# Patient Record
Sex: Female | Born: 1935 | Race: White | Hispanic: No | Marital: Married | Smoking: Former smoker
Health system: Southern US, Community
[De-identification: ages and names within clinical notes are randomized; demographics above are authoritative.]

## PROBLEM LIST (undated history)

## (undated) DIAGNOSIS — E1129 Type 2 diabetes mellitus with other diabetic kidney complication: Secondary | ICD-10-CM

## (undated) DIAGNOSIS — N184 Chronic kidney disease, stage 4 (severe): Secondary | ICD-10-CM

## (undated) DIAGNOSIS — I5032 Chronic diastolic (congestive) heart failure: Secondary | ICD-10-CM

## (undated) DIAGNOSIS — E669 Obesity, unspecified: Secondary | ICD-10-CM

## (undated) DIAGNOSIS — E039 Hypothyroidism, unspecified: Secondary | ICD-10-CM

## (undated) DIAGNOSIS — R011 Cardiac murmur, unspecified: Secondary | ICD-10-CM

## (undated) DIAGNOSIS — E785 Hyperlipidemia, unspecified: Secondary | ICD-10-CM

## (undated) DIAGNOSIS — D638 Anemia in other chronic diseases classified elsewhere: Secondary | ICD-10-CM

## (undated) DIAGNOSIS — N2581 Secondary hyperparathyroidism of renal origin: Secondary | ICD-10-CM

## (undated) DIAGNOSIS — M109 Gout, unspecified: Secondary | ICD-10-CM

## (undated) DIAGNOSIS — IMO0001 Reserved for inherently not codable concepts without codable children: Secondary | ICD-10-CM

## (undated) DIAGNOSIS — I10 Essential (primary) hypertension: Secondary | ICD-10-CM

## (undated) DIAGNOSIS — M1990 Unspecified osteoarthritis, unspecified site: Secondary | ICD-10-CM

## (undated) DIAGNOSIS — R609 Edema, unspecified: Secondary | ICD-10-CM

## (undated) HISTORY — PX: BACK SURGERY: SHX140

## (undated) HISTORY — PX: HIP SURGERY: SHX245

## (undated) HISTORY — PX: ANKLE SURGERY: SHX546

## (undated) HISTORY — PX: ROTATOR CUFF REPAIR: SHX139

---

## 1999-02-03 ENCOUNTER — Other Ambulatory Visit: Admission: RE | Admit: 1999-02-03 | Discharge: 1999-02-03 | Payer: Self-pay | Admitting: *Deleted

## 2001-08-30 ENCOUNTER — Encounter: Admission: RE | Admit: 2001-08-30 | Discharge: 2001-10-03 | Payer: Self-pay | Admitting: *Deleted

## 2002-03-17 ENCOUNTER — Inpatient Hospital Stay (HOSPITAL_COMMUNITY): Admission: RE | Admit: 2002-03-17 | Discharge: 2002-03-21 | Payer: Self-pay | Admitting: Orthopedic Surgery

## 2002-03-17 ENCOUNTER — Encounter: Payer: Self-pay | Admitting: Orthopedic Surgery

## 2002-03-21 ENCOUNTER — Inpatient Hospital Stay
Admission: AD | Admit: 2002-03-21 | Discharge: 2002-03-28 | Payer: Self-pay | Admitting: Physical Medicine & Rehabilitation

## 2002-07-30 ENCOUNTER — Ambulatory Visit (HOSPITAL_BASED_OUTPATIENT_CLINIC_OR_DEPARTMENT_OTHER): Admission: RE | Admit: 2002-07-30 | Discharge: 2002-07-30 | Payer: Self-pay | Admitting: Orthopedic Surgery

## 2003-02-14 ENCOUNTER — Encounter: Payer: Self-pay | Admitting: Emergency Medicine

## 2003-02-14 ENCOUNTER — Emergency Department (HOSPITAL_COMMUNITY): Admission: EM | Admit: 2003-02-14 | Discharge: 2003-02-14 | Payer: Self-pay | Admitting: Emergency Medicine

## 2004-04-27 ENCOUNTER — Ambulatory Visit (HOSPITAL_COMMUNITY): Admission: RE | Admit: 2004-04-27 | Discharge: 2004-04-27 | Payer: Self-pay | Admitting: Interventional Cardiology

## 2005-07-13 ENCOUNTER — Encounter: Admission: RE | Admit: 2005-07-13 | Discharge: 2005-07-13 | Payer: Self-pay | Admitting: Nephrology

## 2006-08-15 ENCOUNTER — Emergency Department (HOSPITAL_COMMUNITY): Admission: EM | Admit: 2006-08-15 | Discharge: 2006-08-15 | Payer: Self-pay | Admitting: Emergency Medicine

## 2006-10-23 ENCOUNTER — Encounter: Admission: RE | Admit: 2006-10-23 | Discharge: 2006-10-23 | Payer: Self-pay | Admitting: Orthopedic Surgery

## 2006-10-26 ENCOUNTER — Ambulatory Visit (HOSPITAL_BASED_OUTPATIENT_CLINIC_OR_DEPARTMENT_OTHER): Admission: RE | Admit: 2006-10-26 | Discharge: 2006-10-26 | Payer: Self-pay | Admitting: Orthopedic Surgery

## 2009-08-27 ENCOUNTER — Encounter: Admission: RE | Admit: 2009-08-27 | Discharge: 2009-08-27 | Payer: Self-pay | Admitting: Orthopaedic Surgery

## 2010-01-17 ENCOUNTER — Ambulatory Visit: Payer: Self-pay | Admitting: Surgery

## 2010-01-21 ENCOUNTER — Inpatient Hospital Stay (HOSPITAL_COMMUNITY): Admission: EM | Admit: 2010-01-21 | Discharge: 2010-01-26 | Payer: Self-pay | Admitting: Emergency Medicine

## 2010-01-21 ENCOUNTER — Ambulatory Visit: Payer: Self-pay | Admitting: Cardiology

## 2010-01-24 ENCOUNTER — Encounter (INDEPENDENT_AMBULATORY_CARE_PROVIDER_SITE_OTHER): Payer: Self-pay | Admitting: Internal Medicine

## 2010-12-05 ENCOUNTER — Encounter (HOSPITAL_BASED_OUTPATIENT_CLINIC_OR_DEPARTMENT_OTHER)
Admission: RE | Admit: 2010-12-05 | Discharge: 2010-12-05 | Disposition: A | Discharge status: Home / Self Care | Payer: Medicare HMO | Source: Ambulatory Visit | Attending: Orthopedic Surgery | Admitting: Orthopedic Surgery

## 2010-12-05 ENCOUNTER — Ambulatory Visit
Admission: RE | Admit: 2010-12-05 | Discharge: 2010-12-05 | Disposition: A | Discharge status: Home / Self Care | Payer: 59 | Source: Ambulatory Visit | Attending: Orthopedic Surgery | Admitting: Orthopedic Surgery

## 2010-12-05 ENCOUNTER — Other Ambulatory Visit: Payer: Self-pay | Admitting: Orthopedic Surgery

## 2010-12-05 DIAGNOSIS — Z01811 Encounter for preprocedural respiratory examination: Secondary | ICD-10-CM

## 2010-12-05 DIAGNOSIS — Z01812 Encounter for preprocedural laboratory examination: Secondary | ICD-10-CM | POA: Insufficient documentation

## 2010-12-05 LAB — BASIC METABOLIC PANEL
CO2: 30 mEq/L (ref 19–32)
Calcium: 8.7 mg/dL (ref 8.4–10.5)
Creatinine, Ser: 1.26 mg/dL — ABNORMAL HIGH (ref 0.4–1.2)
GFR calc non Af Amer: 41 mL/min — ABNORMAL LOW (ref >60)
Sodium: 138 mEq/L (ref 135–145)

## 2010-12-06 ENCOUNTER — Ambulatory Visit (HOSPITAL_BASED_OUTPATIENT_CLINIC_OR_DEPARTMENT_OTHER)
Admission: RE | Admit: 2010-12-06 | Discharge: 2010-12-06 | Disposition: A | Discharge status: Home / Self Care | Payer: 59 | Source: Ambulatory Visit | Attending: Orthopedic Surgery | Admitting: Orthopedic Surgery

## 2010-12-06 DIAGNOSIS — G5600 Carpal tunnel syndrome, unspecified upper limb: Secondary | ICD-10-CM | POA: Insufficient documentation

## 2010-12-06 DIAGNOSIS — Z538 Procedure and treatment not carried out for other reasons: Secondary | ICD-10-CM | POA: Insufficient documentation

## 2010-12-22 ENCOUNTER — Ambulatory Visit (HOSPITAL_BASED_OUTPATIENT_CLINIC_OR_DEPARTMENT_OTHER)
Admission: RE | Admit: 2010-12-22 | Discharge: 2010-12-22 | Disposition: A | Discharge status: Home / Self Care | Payer: Medicare HMO | Source: Ambulatory Visit | Attending: Orthopedic Surgery | Admitting: Orthopedic Surgery

## 2010-12-22 DIAGNOSIS — I509 Heart failure, unspecified: Secondary | ICD-10-CM | POA: Insufficient documentation

## 2010-12-22 DIAGNOSIS — M65839 Other synovitis and tenosynovitis, unspecified forearm: Secondary | ICD-10-CM | POA: Insufficient documentation

## 2010-12-22 DIAGNOSIS — E119 Type 2 diabetes mellitus without complications: Secondary | ICD-10-CM | POA: Insufficient documentation

## 2010-12-22 DIAGNOSIS — I10 Essential (primary) hypertension: Secondary | ICD-10-CM | POA: Insufficient documentation

## 2010-12-22 DIAGNOSIS — M720 Palmar fascial fibromatosis [Dupuytren]: Secondary | ICD-10-CM | POA: Insufficient documentation

## 2010-12-22 DIAGNOSIS — N289 Disorder of kidney and ureter, unspecified: Secondary | ICD-10-CM | POA: Insufficient documentation

## 2010-12-22 DIAGNOSIS — G5600 Carpal tunnel syndrome, unspecified upper limb: Secondary | ICD-10-CM | POA: Insufficient documentation

## 2010-12-22 DIAGNOSIS — M65849 Other synovitis and tenosynovitis, unspecified hand: Secondary | ICD-10-CM | POA: Insufficient documentation

## 2010-12-22 DIAGNOSIS — J45909 Unspecified asthma, uncomplicated: Secondary | ICD-10-CM | POA: Insufficient documentation

## 2010-12-22 LAB — POCT I-STAT, CHEM 8
Calcium, Ion: 1.14 mmol/L (ref 1.12–1.32)
Creatinine, Ser: 1.8 mg/dL — ABNORMAL HIGH (ref 0.4–1.2)
Glucose, Bld: 241 mg/dL — ABNORMAL HIGH (ref 70–99)
Potassium: 4.2 mEq/L (ref 3.5–5.1)
TCO2: 27 mmol/L (ref 0–100)

## 2010-12-23 NOTE — Op Note (Signed)
NAME:  Donna Hurst, Donna Hurst               ACCOUNT NO.:  [id number]  MEDICAL RECORD NO.:  [id number]          PATIENT TYPE:  LOCATION:                                 FACILITY:  PHYSICIAN:  Donna Hurst, M.D. DATE OF BIRTH:  11-12-1931  DATE OF PROCEDURE:  12/22/2010 DATE OF DISCHARGE:                              OPERATIVE REPORT   PREOPERATIVE DIAGNOSES: 1. Severe left carpal tunnel syndrome documented by electrodiagnostic     studies. 2. Also, significant stenosing tenosynovitis left thumb, left index     finger, left ring finger. 3. Incidental palmar fibromatosis.  POSTOPERATIVE DIAGNOSES: 1. Severe left carpal tunnel syndrome documented by electrodiagnostic     studies. 2. Also, significant stenosing tenosynovitis left thumb, left index     finger, left ring finger. 3. Incidental palmar fibromatosis.  OPERATION: 1. Release of left transverse carpal ligament. 2. Through separate incision release of left thumb A1 pulley. 3. Through separate incision release of left index finger A1 pulley. 4. Through separate incision release of left ring finger A1 pulley.  OPERATING SURGEON:  Donna Fitch. Janasha Barkalow, MD  ASSISTANT:  Donna Rusk PA-C.  ANESTHESIA:  General by LMA.  SUPERVISING ANESTHESIOLOGIST:  Donna Hurst.  INDICATIONS:  Donna Hurst is a 79-year woman referred through the courtesy of Donna Hurst for evaluation and management of left hand numbness and triggering of multiple fingers.  Clinical examination revealed signs of early palmar fibromatosis.  We pointed out to Donna Hurst that surgery to the hand would likely stimulate more fibromatosis.  Electrodiagnostic studies performed in 2009 demonstrated very severe left carpal tunnel syndrome.  She lived with these symptoms for several years and now requests release of the left transcarpal ligament.  In the preoperative holding area, we carefully examined her hand.  In addition to her thumb triggering, she had  significant triggering and flexion of the left index finger and left ring finger.  She requested that we correct all of the affected fingers.  Her permit was adjusted to reflect anticipated surgery on the thumb, index and ring finger for trigger finger release, and carpal tunnel release.  After informed consent, she was brought to the operating room at this time.  Preoperatively, she was interviewed by Donna Hurst.  General anesthesia by LMA technique was recommended and accepted.  After informed consent, she was brought to the operating room at this time.  PROCEDURE:  Donna Hurst was brought to room 1 of the [facility] and placed in supine position on the operating room table.  Following induction of general anesthesia by LMA technique, the left arm was prepped with Betadine soap solution, sterilely draped.  A pneumatic tourniquet was applied proximal left brachium.  Following exsanguination of the left arm with Esmarch bandage, arterial tourniquet was inflated to 120 mmHg.  The procedure commenced with a short incision in line of the ring finger and the palm.  Subcutaneous tissues were carefully divided followed by the palmar fascia.  This split longitudinally to the common sensory branch of the median nerve.  These were followed back to transcarpal ligament, which was gently isolated from the median  nerve. Ligament was then released along its ulnar border extending into the distal forearm.  This widely opened the carpal canal.  The contents of the canal were carefully inspected and found to be remarkable for fibrotic tenosynovium in the ulnar bursa.  The nerve was edematous due to chronic compression.  The wound was inspect for bleeding points followed by repair of the skin with intradermal 3-0 Prolene.  Attention was then directed to the thumb. A transverse incision was fashioned over the palpably thickened A1 pulley.  Subcutaneous tissues were carefully divided taking  care to retract the radial proper digital nerve.  The pulley was split with scalpel and scissors.  Thereafter, full range of motion of the IP joint of the thumb was recovered.  The wound was repaired with intradermal 3-0 Prolene.  Oblique incision was then fashioned over the A1 pulleys of the index and ring fingers.  In the ring finger incision and incidental release of the palmar fascia was accomplished to prevent rapid recurrence of a skin contracture in the region of her Dupuytren palmar fibromatosis.  A1 pulley of the index finger was isolated, split with scalpel and scissors and the tendons delivered.  There were no significant tendon pathologies noted.  In the ring finger, A1 pulley was split with scalpel and scissors followed by delivery of the tendons. There was a dense cuff of fibrotic tenosynovium that was removed with micro rongeur.  These wounds were then repaired with intradermal 3-0 Prolene suture.  A compressive dressing was applied with Xeroflo on the wounds.  Sterile gauze, sterile Webril, and a volar plaster splint maintaining the wrist in 10 degrees of dorsiflexion.  There were no apparent complications.     Donna Hurst, M.D.     RVS/MEDQ  D:  12/22/2010  T:  12/23/2010  Job:  [id number]  Electronically Signed by Donna Hurst M.D. on 12/23/2010 09:12:45 AM

## 2010-12-27 LAB — CBC
HCT: 23.6 % — ABNORMAL LOW (ref 36.0–46.0)
HCT: 25.7 % — ABNORMAL LOW (ref 36.0–46.0)
HCT: 28.6 % — ABNORMAL LOW (ref 36.0–46.0)
HCT: 29.6 % — ABNORMAL LOW (ref 36.0–46.0)
Hemoglobin: 8.5 g/dL — ABNORMAL LOW (ref 12.0–15.0)
Hemoglobin: 9.3 g/dL — ABNORMAL LOW (ref 12.0–15.0)
Hemoglobin: 9.6 g/dL — ABNORMAL LOW (ref 12.0–15.0)
Hemoglobin: 9.6 g/dL — ABNORMAL LOW (ref 12.0–15.0)
Hemoglobin: 9.7 g/dL — ABNORMAL LOW (ref 12.0–15.0)
MCHC: 32.5 g/dL (ref 30.0–36.0)
MCHC: 32.6 g/dL (ref 30.0–36.0)
MCHC: 33.2 g/dL (ref 30.0–36.0)
MCV: 95.5 fL (ref 78.0–100.0)
Platelets: 437 10*3/uL — ABNORMAL HIGH (ref 150–400)
RBC: 2.68 MIL/uL — ABNORMAL LOW (ref 3.87–5.11)
RBC: 2.99 MIL/uL — ABNORMAL LOW (ref 3.87–5.11)
RBC: 3.06 MIL/uL — ABNORMAL LOW (ref 3.87–5.11)
RBC: 3.11 MIL/uL — ABNORMAL LOW (ref 3.87–5.11)
RDW: 17 % — ABNORMAL HIGH (ref 11.5–15.5)
RDW: 17.1 % — ABNORMAL HIGH (ref 11.5–15.5)
RDW: 17.6 % — ABNORMAL HIGH (ref 11.5–15.5)
WBC: 11.5 10*3/uL — ABNORMAL HIGH (ref 4.0–10.5)
WBC: 12.6 10*3/uL — ABNORMAL HIGH (ref 4.0–10.5)
WBC: 13.1 10*3/uL — ABNORMAL HIGH (ref 4.0–10.5)

## 2010-12-27 LAB — GLUCOSE, CAPILLARY
Glucose-Capillary: 126 mg/dL — ABNORMAL HIGH (ref 70–99)
Glucose-Capillary: 146 mg/dL — ABNORMAL HIGH (ref 70–99)
Glucose-Capillary: 169 mg/dL — ABNORMAL HIGH (ref 70–99)
Glucose-Capillary: 171 mg/dL — ABNORMAL HIGH (ref 70–99)
Glucose-Capillary: 177 mg/dL — ABNORMAL HIGH (ref 70–99)
Glucose-Capillary: 181 mg/dL — ABNORMAL HIGH (ref 70–99)
Glucose-Capillary: 192 mg/dL — ABNORMAL HIGH (ref 70–99)
Glucose-Capillary: 207 mg/dL — ABNORMAL HIGH (ref 70–99)
Glucose-Capillary: 219 mg/dL — ABNORMAL HIGH (ref 70–99)
Glucose-Capillary: 222 mg/dL — ABNORMAL HIGH (ref 70–99)
Glucose-Capillary: 233 mg/dL — ABNORMAL HIGH (ref 70–99)
Glucose-Capillary: 243 mg/dL — ABNORMAL HIGH (ref 70–99)

## 2010-12-27 LAB — URINALYSIS, ROUTINE W REFLEX MICROSCOPIC
Glucose, UA: NEGATIVE mg/dL
Nitrite: NEGATIVE
pH: 5 (ref 5.0–8.0)

## 2010-12-27 LAB — TYPE AND SCREEN: Antibody Screen: NEGATIVE

## 2010-12-27 LAB — DIFFERENTIAL
Eosinophils Absolute: 0.2 10*3/uL (ref 0.0–0.7)
Eosinophils Relative: 2 % (ref 0–5)
Lymphocytes Relative: 9 % — ABNORMAL LOW (ref 12–46)
Monocytes Absolute: 1.4 10*3/uL — ABNORMAL HIGH (ref 0.1–1.0)
Monocytes Relative: 11 % (ref 3–12)
Neutro Abs: 10.1 10*3/uL — ABNORMAL HIGH (ref 1.7–7.7)
Neutrophils Relative %: 79 % — ABNORMAL HIGH (ref 43–77)

## 2010-12-27 LAB — RETICULOCYTES
RBC.: 3.2 MIL/uL — ABNORMAL LOW (ref 3.87–5.11)
RBC.: 3.3 MIL/uL — ABNORMAL LOW (ref 3.87–5.11)
Retic Count, Absolute: 128.8 10*3/uL (ref 19.0–186.0)
Retic Count, Absolute: 132 10*3/uL (ref 19.0–186.0)
Retic Count, Absolute: 153.6 10*3/uL (ref 19.0–186.0)
Retic Ct Pct: 4 % — ABNORMAL HIGH (ref 0.4–3.1)
Retic Ct Pct: 4 % — ABNORMAL HIGH (ref 0.4–3.1)

## 2010-12-27 LAB — BASIC METABOLIC PANEL
BUN: 53 mg/dL — ABNORMAL HIGH (ref 6–23)
BUN: 62 mg/dL — ABNORMAL HIGH (ref 6–23)
CO2: 28 mEq/L (ref 19–32)
CO2: 31 mEq/L (ref 19–32)
CO2: 32 mEq/L (ref 19–32)
CO2: 33 mEq/L — ABNORMAL HIGH (ref 19–32)
Calcium: 8.5 mg/dL (ref 8.4–10.5)
Calcium: 8.7 mg/dL (ref 8.4–10.5)
Chloride: 92 mEq/L — ABNORMAL LOW (ref 96–112)
Chloride: 94 mEq/L — ABNORMAL LOW (ref 96–112)
Creatinine, Ser: 1.35 mg/dL — ABNORMAL HIGH (ref 0.4–1.2)
Creatinine, Ser: 1.76 mg/dL — ABNORMAL HIGH (ref 0.4–1.2)
GFR calc Af Amer: 34 mL/min — ABNORMAL LOW (ref >60)
GFR calc Af Amer: 40 mL/min — ABNORMAL LOW (ref >60)
GFR calc non Af Amer: 21 mL/min — ABNORMAL LOW (ref >60)
GFR calc non Af Amer: 38 mL/min — ABNORMAL LOW (ref >60)
GFR calc non Af Amer: 40 mL/min — ABNORMAL LOW (ref >60)
Glucose, Bld: 150 mg/dL — ABNORMAL HIGH (ref 70–99)
Glucose, Bld: 164 mg/dL — ABNORMAL HIGH (ref 70–99)
Glucose, Bld: 173 mg/dL — ABNORMAL HIGH (ref 70–99)
Glucose, Bld: 202 mg/dL — ABNORMAL HIGH (ref 70–99)
Potassium: 2.8 mEq/L — ABNORMAL LOW (ref 3.5–5.1)
Potassium: 3.5 mEq/L (ref 3.5–5.1)
Potassium: 3.8 mEq/L (ref 3.5–5.1)
Sodium: 133 mEq/L — ABNORMAL LOW (ref 135–145)
Sodium: 134 mEq/L — ABNORMAL LOW (ref 135–145)
Sodium: 135 mEq/L (ref 135–145)
Sodium: 136 mEq/L (ref 135–145)

## 2010-12-27 LAB — URINE CULTURE: Culture: NO GROWTH

## 2010-12-27 LAB — BRAIN NATRIURETIC PEPTIDE
Pro B Natriuretic peptide (BNP): 469 pg/mL — ABNORMAL HIGH (ref 0.0–100.0)
Pro B Natriuretic peptide (BNP): 744 pg/mL — ABNORMAL HIGH (ref 0.0–100.0)

## 2010-12-27 LAB — POCT CARDIAC MARKERS
CKMB, poc: <1 ng/mL — ABNORMAL LOW (ref 1.0–8.0)
Myoglobin, poc: 243 ng/mL (ref 12–200)
Troponin i, poc: <0.05 ng/mL (ref 0.00–0.09)
Troponin i, poc: <0.05 ng/mL (ref 0.00–0.09)

## 2010-12-27 LAB — BLOOD GAS, ARTERIAL
Drawn by: 307971
O2 Saturation: 94.1 %
Patient temperature: 98.6

## 2010-12-27 LAB — FOLATE: Folate: 5.7 ng/mL

## 2010-12-27 LAB — CARDIAC PANEL(CRET KIN+CKTOT+MB+TROPI)
CK, MB: 1.6 ng/mL (ref 0.3–4.0)
CK, MB: 1.6 ng/mL (ref 0.3–4.0)
Relative Index: INVALID (ref 0.0–2.5)
Troponin I: 0.04 ng/mL (ref 0.00–0.06)

## 2010-12-27 LAB — IRON AND TIBC
Iron: 11 ug/dL — ABNORMAL LOW (ref 42–135)
Saturation Ratios: 6 % — ABNORMAL LOW (ref 20–55)
TIBC: 196 ug/dL — ABNORMAL LOW (ref 250–470)

## 2010-12-27 LAB — POCT I-STAT, CHEM 8
Calcium, Ion: 1 mmol/L — ABNORMAL LOW (ref 1.12–1.32)
Creatinine, Ser: 2.5 mg/dL — ABNORMAL HIGH (ref 0.4–1.2)
Sodium: 126 mEq/L — ABNORMAL LOW (ref 135–145)
TCO2: 22 mmol/L (ref 0–100)

## 2010-12-27 LAB — PROTIME-INR: INR: 1.22 (ref 0.00–1.49)

## 2010-12-27 LAB — FERRITIN: Ferritin: 384 ng/mL — ABNORMAL HIGH (ref 10–291)

## 2010-12-27 LAB — D-DIMER, QUANTITATIVE: D-Dimer, Quant: 2.39 ug/mL-FEU — ABNORMAL HIGH (ref 0.00–0.48)

## 2011-02-21 NOTE — Procedures (Signed)
DUPLEX DEEP VENOUS EXAM - LOWER EXTREMITY   INDICATION:  Bilateral lower extremity edema.   HISTORY:  Edema:  Yes.  Trauma/Surgery:  Yes, back surgery.  Pain:  Yes.  PE:  No.  Previous DVT:  No.  Anticoagulants:  Other:   DUPLEX EXAM:                CFV   SFV   PopV  PTV    GSV                R  L  R  L  R  L  R   L  R  L  Thrombosis    o  o  o  o  o  o  o   o  o  o  Spontaneous   +  +  +  +  +  +  +   +  +  +  Phasic        +  +  +  +  +  +  +   +  +  +  Augmentation  +  +  +  +  +  +  +   +  +  +  Compressible  +  +  +  +  +  +  +   +  +  +  Competent     +  +  +  +  +  +  +   +  +  +   Legend:  + - yes  o - no  p - partial  D - decreased   IMPRESSION:  No evidence of deep venous thrombosis noted in bilateral  legs.    _____________________________  V. Charlena Cross, MD   MG/MEDQ  D:  01/17/2010  T:  01/18/2010  Job:  [id number]

## 2011-02-24 NOTE — H&P (Signed)
[place]  Patient:    Donna Hurst, Donna Hurst. Visit Number: [id number] MRN: [id number]          Service Type: Attending:  Georgena Spurling, M.D. Dictated by:   Jamelle Rushing, P.A. Adm. Date:  10/17/01                           History and Physical  DATE OF BIRTH:  1932/05/29  CHIEF COMPLAINT:  Left hip pain.  HISTORY OF PRESENT ILLNESS:  The patient is a 77 year old white female with several years of intermittent left hip pain.  The pain has significantly worsened over the last several months, making ambulation up and down stairs, in and out of the car, in and out of the chair extremely difficult.  The patient indicates the pain is located in the posterior buttocks region around the lateral thigh down into the mid thigh.  She has very little groin pain. The pain did not improve with any physical therapy.  She describes the pain as a deep burning sensation with a grabbing sensation.  She does have a sensation as if her hip will give out.  She has no other mechanical symptoms such as grinding or popping.  The patient has been using a cane for assistance of ambulation and due to the fact her hip continually feels like it wants to give out.  ALLERGIES: 1. PENICILLIN. 2. MSG. 3. SHELLFISH.  CURRENT MEDICATIONS: 1. Lantus 18 units q.h.s. 2. NovoLog insulin 6 units per scale at lunch and dinner. 3. Zocor 40 mg p.o. q.p.m. 4. Norvasc 10 mg p.o. q.a.m. 5. Atenolol 100 mg p.o. q.a.m. 6. Hyzaar 100/25 mg p.o. q.a.m. 7. Imipramine 25 mg p.o. q.p.m. 8. Calcium 1200 mg p.o. q.d. 9. Lasix 10 mg p.o. q.d.  MEDICAL HISTORY: 1. Diabetes mellitus, insulin controlled.  The patient currently checks her    blood sugar four or five times a day.  She normally runs at lunch time    between dinner time with food in the low 200s; at night she is usually low    around 80 to 100. 2. Hypertension. 3. History of decreased kidney function with slowly gradually  increasing    creatinine; currently last exam was around 34 and just being monitored. 4. The patient has a strong family history of cardiac disease and will have a    cardiac stress test on Mar 07, 2002.  PAST SURGICAL HISTORY: 1. Ankle surgery. 2. Rotator cuff repair.  The patient had no serious complications with any of the above-mentioned surgical procedures.  SOCIAL HISTORY:  The patient is a healthy-appearing white female, 77 years of age.  Denies any smoking history.  She does have an occasional glass of wine. She is currently married.  She does have three grown children.  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  She lives in a split-level house.  FAMILY PHYSICIAN:  Dr. Guerry Bruin at [phone].  FAMILY MEDICAL HISTORY:  Mother is deceased at 67 years of age from a stroke and cardiac complications.  Father is deceased at 77 years of age from cardiac disease.  The patient has one brother deceased from cardiac disease, one brother alive with a history of diabetes and multiple CABGs.  REVIEW OF SYSTEMS:  Positive for upper and lower dentures.  She does wear glasses at all times.  She does have occasionally shortness of breath with exertion she relates  to her increased weight and lack of exercise due to her hip problem.  She denies any chest discomfort or diaphoresis related with this.  The patient does have increased urinary frequency, has had it evaluated but no specific treatment is necessary.  PHYSICAL EXAMINATION:  VITAL SIGNS:  Height 5 feet 1 inch, weight 186 pounds.  Pulse 72 and regular, respirations 12, temperature 98.8, blood pressure 142/60.  GENERAL:  This is a healthy-appearing, short in stature, heavyset white female, ambulates with her cane in her right side with a significant left-sided limp.  She ambulates very slowly.  She does appear to be uncomfortable with ambulation and climbing on and off the exam table and getting in and  out of the chair.  HEENT:  Head was normocephalic, atraumatic, nontender over maxillary or frontal sinuses.  Pupils are equal, round and reactive to light, accommodating to light.  Extraocular movements intact.  Sclerae are not icteric. Conjunctivae is pink and moist.  External ears are without deformities, canals patent, TMs pearly gray and intact.  Gross hearing is intact.  Nasal mucous membranes were pink and moist, septum was midline, no polyps noted.  Oral buccal mucosa was pink and moist without lesions.  Upper and lower dentures were in place.  Uvula was midline.  The patient is able to swallow without any difficulty.  NECK:  Supple.  No palpable lymphadenopathy.  Thyroid gland was nontender. She had good flexion and extension.  Her rotation right to left was a little decreased with discomfort on the left trapezius muscle group region with the rotation to the left.  She was nontender to percussion along the complete spine along the spinous processes.  CHEST:  Lung sounds were clear and equal bilaterally.  No wheezes, rales, rhonchi, or rubs noted.  HEART:  Regular rate and rhythm, S1 and S2 was auscultated.  A 2/6 systolic swishing murmur was noted.  ABDOMEN:  Round, soft, nontender.  Bowel sounds were normal active throughout. Unable to palpate any hepatosplenomegaly.  She was nontender to deep palpation.  CVA was nontender to percussion.  EXTREMITIES:  Upper extremities were symmetrically sized and shaped.  She had relatively good range of motion of right and left shoulders but right was slightly less and she was a little bit slower with the range of motion. Elbows and wrists had full range of motion.  Motor strength was 5/5.  Lower extremities:  Right hip had full extension.  She was able to flex that up to 110 degrees, mostly limited by abdominal girth.  She had 20-30 degrees internal/external rotation without any mechanical symptoms.  Left hip had full extension,  flexion up to 90 degrees.  She had 10 degrees external rotation, no  internal rotation limited by mechanical and discomfort.  Bilateral knees were without any signs of erythema or ecchymosis.  There was no sign of effusion. She had a minimal amount of crepitus on the right patella.  She had none on the left.  She was nontender along the medial or lateral joint lines bilaterally.  No medial/lateral or anterior/posterior instability.  She had good range of motion.  Calves were nontender.  Ankles were symmetrical size and shape with good dorsi and plantar flexion.  PERIPHERAL VASCULATURE:  Carotid pulses were 2+, no bruits; radial pulses 2+; dorsalis pedis and posterior tibial pulses were 2+.  No lower extremity varicosities or venous stasis changes.  She had a trace edema in the ankles.  NEUROLOGIC:  The patient was conscious, alert, and appropriate,  held an easy conversation with the examiner.  Cranial nerves II-XII are grossly intact. Deep tendon reflexes of the upper and lower extremities were brisk and symmetrical right to left.  She was grossly intact to light touch sensation from head to toe.  BREAST, RECTAL, GENITOURINARY:  Deferred.  PLAN:  The patient is scheduled for a Cardiolite stress test with Dr. Katrinka Blazing on May 30.  Our office will be contacted with results of this test at that time. The patient will undergo all routine labs and tests prior to having a left total hip arthroplasty on March 17, 2002 by Dr. Georgena Spurling.  The patient will be monitored closely for her diabetes, her hypertension, and her kidney function, and we will request a medical evaluation while the patient is in the hospital by her P.M.D. Dictated by:   Jamelle Rushing, P.A. Attending:  Georgena Spurling, M.D. DD:  03/06/02 TD:  03/07/02 Job: [id number]

## 2011-02-24 NOTE — Discharge Summary (Signed)
[place]. [facility]  Patient:    JOSEPH, JOHNS Visit Number: [id number] MRN: [id number]          Service Type: ECR Location: S[facility]U 4530 01 Attending Physician:  Faith Rogue T Dictated by:   Jamelle Rushing, P.A. Admit Date:  03/21/2002 Discharge Date: 03/28/2002   CC:         Gaspar Garbe, M.D.   Discharge Summary  ADMISSION DIAGNOSES: 1. Left hip osteoarthritis. 2. Diabetes mellitus type 2. 3. Hypertension. 4. Renal insufficiency.  DISCHARGE DIAGNOSES: 1. Left total hip arthroplasty. 2. Postoperative blood loss anemia. 3. Urinary tract infection. 4. Diabetes mellitus. 5. Hypertension. 6. Renal insufficiency.  HISTORY OF PRESENT ILLNESS:  The patient is a 77 year old white female with an intermittent pain in her left hip which has significantly worsened over the previous six to seven months.  The patient is making the patients ambulation, stairs, in and out of car or chair very difficult.  The pain is located along the posterior buttock region round to the lateral aspect of the thigh.  She describes the pain as a deep burning sensation with a sense her hip will be giving out.  She has no mechanical symptoms and she has been using a cane to assist ambulation.  ALLERGIES: 1. PENICILLIN. 2. MSG. 3. SHELLFISH.  CURRENT MEDICATIONS: 1. Lantus 18 mg p.o. q.h.s. 2. NovoLog 6 units per sliding scale at lunch and dinner. 3. Zocor 40 mg p.o. q.p.m. 4. Imipramine 25 mg p.o. q.p.m. 5. Norvasc 10 mg p.o. q.d. 6. Atenolol 100 mg p.o. q.d. 7. Hyzaar 100/25 mg p.o. q.d. 8. Calcium 1200 mg p.o. q.d. 9. Lasix 10 mg p.o. q.d.  SURGICAL PROCEDURE:  On March 17, 2002 the patient was taken to the operating room by Dr. Georgena Spurling, assisted by Jamelle Rushing, P.A.-C.  Under general anesthesia the patient underwent a left total hip replacement.  There were no drains left in place.  Estimated blood loss was 300 cc and the patient tolerated the  procedure well and was transferred to the recovery room in good condition.  CONSULTS:  The following routine consults were requested:  Physical therapy, occupational therapy, rehab, and case management.  HOSPITAL COURSE:  On March 17, 2002 the patient was admitted to [facility] under the care of Dr. Georgena Spurling.  The patient was taken to the operating room where a left total hip arthroplasty was performed.  The patient tolerated the procedure well.  Estimated blood loss was 300 cc and the patient was transferred to the recovery room and then to the orthopedic floor in good condition.  The patient did get placed on Lovenox for routine DVT prophylaxis.  The patient then incurred a total of four days on the orthopedic floor before being transferred to the rehab unit.  The patient did develop some postoperative blood loss anemia with her hemoglobin dropping to 7.7 so she was typed and crossed and received two units of packed red blood cells.  The patient also complained of increased urinary frequency but no burning or discomfort.  Urinalysis was checked and it was shown to have many bacteria so she was placed on Cipro for a total of three days.  The patients wound remained benign for any signs of infection.  Her leg remained neuromotor vascularly intact.  She did work daily with physical therapy and it was felt that she could benefit from a short stay on the rehab floor before being transferred home safely.  Rehab evaluation was done and she was accepted on postoperative day four in good condition, so she was transferred to that unit.  LABORATORY DATA:  CBC on March 21, 2002 WBC is 15.2, hemoglobin 11.0, hematocrit 31.4, platelets 313.  Routine chemistries on March 21, 2002 sodium 137, potassium 3.8, glucose 162, BUN 21, creatinine 1.0.  On March 20, 2002 the patients urinalysis showed moderate hemoglobin, a few epithelial cells and many bacteria.  Due to the many  bacteria, increased white count it was felt the patient had a UTI so she was shown to have greater than 100,000 colonies in her urine of Proteus mirabilis susceptible to all except for nitrofurantoin, so she was placed on Cipro.  The patient received a total of two units of packed red blood cells.  MEDICATIONS UPON DISCHARGE FROM ORTHOPEDIC FLOOR TO REHAB:  1. Colace 100 mg p.o. b.i.d.  2. Trinsicon one tablet p.o. t.i.d.  3. Laxative or enema of choice p.r.n.  4. Reglan 10 mg p.o. q.8h. p.r.n.  5. Percocet one or two tablets q.4-6h. p.r.n.  6. Tylenol 650 mg p.o. q.6h. p.r.n.  7. Robaxin 500 mg p.o. q.6h. p.r.n.  8. Restoril 30 mg p.o. q.h.s. p.r.n.  9. Zocor 40 mg p.o. q.d. 10. Imipramine HCL 25 mg p.o. q.h.s. 11. Lovenox 30 mg subcutaneous q.12h. 12. Norvasc 10 mg p.o. q.d. 13. Lantus 18 units subcutaneous q.h.s. 14. Atenolol 100 mg p.o. q.d. 15. Lasix 10 mg p.o. q.d. 16. Calcium carbonate 12.50 mg p.o. q.d. 17. Cozaar 100 mg p.o. q.d. 18. Hydrochlorothiazide 25 mg p.o. q.d. 19. Cipro 500 mg p.o. q.d. for a total of three days.  DISCHARGE INSTRUCTIONS TO REHAB:  MEDICATIONS:  The patient is to continue medications as dispensed on orthopedic floor.  ACTIVITY:  The patient may be weight bearing as tolerated with the use of a walker.  DIET:  No restrictions.  WOUND CARE:  The patient is to have wound checked daily for any signs of infection.  Staples are to be removed on postoperative day 14.  Lovenox should be continued for a total of 14 days.  FOLLOW-UP:  The patient is to have a follow up appointment with Dr. Sherlean Foot in his office two weeks from date of discharge.  The patient is to call for an appointment.  CONDITION ON DISCHARGE TO REHAB FLOOR:  Improved and good. Dictated by:   Jamelle Rushing, P.A. Attending Physician:  Faith Rogue T DD:  04/16/02 TD:  04/19/02 Job: [id number]

## 2011-02-24 NOTE — Op Note (Signed)
NAME:  Donna Hurst, Donna Hurst                           ACCOUNT NO.:  [id number]   MEDICAL RECORD NO.:  [id number]                   PATIENT TYPE:  AMB   LOCATION:  DSC                                  FACILITY:  [facility]   PHYSICIAN:  Mila Homer. Sherlean Foot, M.D.              DATE OF BIRTH:  26-Feb-1932   DATE OF PROCEDURE:  07/30/2002  DATE OF DISCHARGE:                                 OPERATIVE REPORT   PREOPERATIVE DIAGNOSIS:  Right shoulder rotator cuff tear, acromioclavicular  joint arthritis.   PROCEDURES:  Right shoulder arthroscopy with glenohumeral debridement,  subacromial decompression, distal clavicle resection, and rotator cuff  repair.   SURGEON:  Mila Homer. Sherlean Foot, M.D.   ASSISTANT:  Jamelle Rushing, P.A.   INDICATIONS FOR PROCEDURE:  The patient is a 77 year old with three months  worth of treatment for shoulder pain.  An MRI with evidence of a full-  thickness rotator cuff tear.   DESCRIPTION OF PROCEDURE:  The patient was laid supine and administered  general anesthesia.  He was then placed in a beach chair position and right  shoulder was prepped and draped in usual sterile fashion.  Posterior and  lateral portals were created with a #11-blade, blunt trocar, and cannula.  Glenohumeral arthroscopy revealed some grade 3 chondromalacia on the humeral  head, grade 1 on the glenoid.  There was a biceps anchor tear of the  proximal 1-2 cm of biceps impinging on the glenohumeral joint.  I therefore  created an anterior portal, where I debrided the biceps tendon, as well as  some of the arthritis on the humeral head, some of the frayed degenerative  tearing of the labrum as well, and the undersurface of the rotator cuff  tear.  Then, went from posterior into the subacromial space and used a 3.5  Gator shaver to remove the subacromial bursa, identified the osteophyte on  the [facility] joint, as well as the anterolateral acromion.  Then, placed a 4.0  cylindrical bur in and performed an  aggressive anterolateral acromioplasty  and distal clavicle resection.  I then released the CA ligament with  Arthrocare wand and completed the bursectomy and obtained hemostasis.  Rotator cuff was retracted approximately 3 cm and was approximately 6 cm in  length.  At this point, I removed the arthroscope and extended the lateral  portal up to the anterolateral corner making an approximate 3 cm incision.  I palpated and split the deltoid in line with the raphe up to the  anterolateral corner and placed a self-retaining retractor in place.  Then  used the arthroscopic bur to bur the humerus down to bleeding bone and then  placed four stay sutures with the Viper punch.  I then placed two 5.0 mm  __________ corkscrew anchors at the most anterior and most posterior aspects  of the tear into the bare area.  The central bare area was very  soft bone,  so I placed two tunnels through this area using the punch.  Then, placed  Mason-Allen sutures with #2 fiberwire in the central portion of the tear and  then used vertical mattress sutures from the anchor stitches in the anterior  and posterior aspects of the tear.  This was a total of eight stitches  through the rotator cuff and it reduced very nicely to the bare area of the  humerus.  I tied them sequentially from anterior to posterior with some mild  tension on the stay sutures and then removed the stay sutures and cut the  tags.  I then irrigated, closed the deltoid down with interrupted 0 Vicryl  sutures.  The subcuticular layer with interrupted 2-0  Vicryl and Steri-Strips.  Dressed the anterior and posterior portals with  Steri-Strips as well.  Dressed with Adaptic, 4 x 4, sterile Webril, and Ace  wrap.  The patient tolerated the procedure well.  Complications - none.  Drains - none.  EBL - minimal.                                                Mila Homer. Sherlean Foot, M.D.    SDL/MEDQ  D:  07/30/2002  T:  07/30/2002  Job:  [id number]

## 2011-02-24 NOTE — Op Note (Signed)
Donna Hurst, Donna Hurst                 ACCOUNT NO.:  [id number]   MEDICAL RECORD NO.:  [id number]          PATIENT TYPE:  AMB   LOCATION:  DSC                          FACILITY:  [facility]   PHYSICIAN:  Katy Fitch. Sypher, M.D. DATE OF BIRTH:  [birth year]   DATE OF PROCEDURE:  10/26/2006  DATE OF DISCHARGE:                               OPERATIVE REPORT   PREOPERATIVE DIAGNOSIS:  Entrapment neuropathy, right median nerve at  carpal tunnel.   POSTOPERATIVE DIAGNOSIS:  Entrapment neuropathy, right median nerve at  carpal tunnel.   OPERATION:  Release of right transverse carpal ligament.   OPERATING SURGEON:  Josephine Igo, MD   ASSISTANT:  Annye Rusk PA-C.   ANESTHESIA:  General by LMA, supervising anesthesiologist is Dr.  Sampson Goon.   INDICATIONS:  Donna Hurst is a 77 year old woman referred through  courtesy of Dr. Wylene Simmer for evaluation of hand numbness and discomfort.  Clinical examination revealed signs of significant right carpal tunnel  syndrome.  Electrodiagnostic studies confirmed median neuropathy at the  right wrist.   Due to failure to respond to nonoperative measures, [name] is brought  to the operating room at this time for release of right transverse  carpal ligament.   PROCEDURE:  Dawson Hollman was brought to the operating room and placed in  supine position on the operating table.   Following the induction general anesthesia by LMA, the right arm was  prepped with Betadine soap solution, sterilely draped.  A pneumatic  tourniquet was applied to proximal right brachium.  Following  exsanguination of the limb with Esmarch bandage, the arterial tourniquet  was inflated to 240 mmHg.   The procedure commenced with a short incision in the line of the ring  finger in the palm.  The subcutaneous tissues were carefully divided  revealing the palmar fascia.  This split longitudinally to reveal the  common sensory branches of the median nerve.  These were followed  back  to the median nerve proper which was gently isolated from transverse  carpal ligament utilizing a Insurance risk surveyor.   The transverse carpal ligament and volar forearm fascia were released  subcutaneously with scissors into the distal forearm.  This widely  opened the carpal canal.   No masses or other predicaments were noted.   Bleeding points along the margin of the released ligament were  electrocauterized with bipolar current followed by repair of the skin  with intradermal 3-0 Prolene suture.   A compressive dressing was applied with a volar plaster splint  maintaining the wrist in 5 degrees of dorsiflexion.   For aftercare [name] is provided prescription for Vicodin 5 mg one by  mouth every four to six hours as needed for pain 20 tablets without  refill.   She will return to see [place] for follow-up in the office in a week or  sooner as needed for problems.      Katy Fitch Sypher, M.D.  Electronically Signed     RVS/MEDQ  D:  10/26/2006  T:  10/26/2006  Job:  [id number]   cc:   Gaspar Garbe,  M.D. 

## 2011-02-24 NOTE — Discharge Summary (Signed)
[place]  Patient:    Donna Hurst, Donna Hurst Visit Number: [id number] MRN: [id number]          Service Type: ECR Location: SACU 4530 01 Attending Physician:  Faith Rogue T Dictated by:   Mcarthur Rossetti. Angiulli, P.A. Admit Date:  03/21/2002 Disc. Date: 03/28/02   CC:         Gaspar Garbe, M.D.  Georgena Spurling, M.D.   Discharge Summary  DISCHARGE DIAGNOSES: 1. Left total hip replacement secondary to osteoarthritis. 2. Insulin-dependent diabetes mellitus. 3. Anemia. 4. Hyperlipidemia. 5. Mild renal insufficiency. 6. Probable biceps tendinitis to the right shoulder.  PROCEDURES: 1. Left total hip replacement on 03/17/02. 2. Status post injection to right shoulder for probable biceps tendinitis.  HISTORY OF PRESENT ILLNESS:  The patient is a 77 year old white female admitted on 03/17/02, with end stage osteoarthritis of the left hip with no relief with conservative care.  Underwent a left total hip replacement on 03/17/02 per Dr. Sherlean Foot.  Placed on subcutaneous Lovenox for deep vein thrombosis prophylaxis and weightbearing as tolerated.  Postoperative anemia, transfused. No chest pain or shortness of breath.  Minimal assistance for ambulation. Latest hemoglobin 11.  Chemistries unremarkable.  Admitted for a comprehensive rehabilitation program.  PAST MEDICAL HISTORY:  See discharge diagnoses.  PAST SURGICAL HISTORY: 1. Ankle surgery. 2. Shoulder surgery.  ALLERGIES:  PENICILLIN.  HABITS:  No tobacco, occasional alcohol.  PRIMARY CARE PHYSICIAN:  Dr. Wylene Simmer, [phone].  ADMISSION MEDICATIONS: 1. Lantus insulin 18 units at bedtime. 2. Novolog insulin 6 units sliding scale at lunch and dinner. 3. Zocor 40 mg q.d. 4. Norvasc 10 mg q.d. 5. Atenolol 100 mg q.d. 6. Hyzaar 100/25 q.d. 7. Imipramine 25 mg q.d. 8. Calcium q.d. 9. Lasix 10 mg q.d.  SOCIAL HISTORY:  Lives with husband in [place].  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  They live in a split-level home, four steps to entry, bedroom is upstairs, husband can assist on discharge.  HOSPITAL COURSE:  The patient did well while in rehabilitation services with therapies initiated daily.  The following issues were followed during the patients rehabilitation course:  Pertaining to Mrs. Rouths left total hip replacement, surgical site healing nicely, no signs of infection.  She would follow up with Dr. Sherlean Foot.  She was weightbearing as tolerated with total hip precautions.  She had completed a course of subcutaneous Lovenox for deep vein thrombosis prophylaxis.  Postoperative anemia was stable with latest hemoglobin 11.9, hematocrit of 35.  She had some variables in her blood sugars due to immobility.  Appetite continued to waiver at times.  She was now on ongoing sliding scale as well as Lantus insulin now at 24 units at bedtime. This continued to be monitored.  Her hyperlipidemia with Zocor, blood pressures remained controlled.  She was still having some urinary frequency. Post void residuals were within normal limits.  She was placed on Ditropan. Overall, functional mobility she was minimal assistance for bed mobility, close supervision for transfers, ambulating 40 feet, simple set up for activities of daily living, although needing some assistance for lower body dressing.  During her hospital course she was maintained on Cipro for wound coverage of the left hip.  She did have some early serous drainage that had greatly improved.  She had some right shoulder pain that she was injected with for probable biceps tendinitis with good results.  This would receive followup.  Latest labs showed a hemoglobin of 11.9, hematocrit 35.  Sodium  137, potassium 4.5, BUN 19, creatinine 1.0.  Liver function tests were within normal limits.  DISCHARGE MEDICATIONS:  1. Trinsicon b.i.d.  2. Zocor 40 mg q.d.  3. Imipramine 25 mg at bedtime.  4. Norvasc 10 mg q.d.  5.  Tenormin 100 mg q.d.  6. Os-Cal 1250 mg q.d.  7. Hyzaar daily.  8. She will complete her course of Cipro for wound coverage.  9. Lasix 20 mg q.d. 10. Lantus insulin 24 units at bedtime. 11. Ditropan 5 mg at bedtime. 12. Tylox p.r.n. pain.  ACTIVITY:  Weightbearing as tolerated with walker with total hip precautions.  DIET:  An 1800 calorie ADA.  WOUND CARE:  Cleanse incision daily with warm water and soap.  Call Dr. Sherlean Foot if any increased redness, drainage, or fever.  The patient will receive home health physical therapy and occupational therapy.  FOLLOWUP:  Dr. Wylene Simmer for ongoing care of diabetes mellitus.Dictated by: Mcarthur Rossetti. Angiulli, P.A. Attending Physician:  Faith Rogue T DD:  03/27/02 TD:  03/28/02 Job: [id number]

## 2011-02-24 NOTE — Op Note (Signed)
[place]  Patient:    Donna Hurst, Donna Hurst Visit Number: [id number] MRN: [id number]          Service Type: SUR Location: 5000 5040 01 Attending Physician:  Georgena Spurling Dictated by:   Georgena Spurling, M.D. Proc. Date: 03/17/02 Admit Date:  03/17/2002                             Operative Report  PREOPERATIVE DIAGNOSIS:  Left hip osteoarthritis.  POSTOPERATIVE DIAGNOSIS:  Left hip osteoarthritis.  PROCEDURE:  Left total hip replacement.  SURGEON:  Georgena Spurling, M.D.  ASSISTANT:  Jamelle Rushing, P.A.  ANESTHESIA:  General.  COMPLICATIONS:  None.  DRAINS:  None.  ESTIMATED BLOOD LOSS:  300 cc.  INDICATION FOR PROCEDURE:  The patient is a 77 year old white female with failure of conservative measures for osteoarthritis of the hip.  Informed consent was obtained.  DESCRIPTION OF PROCEDURE:  The patient was laid supine on the operating room table, placed under general endotracheal anesthesia, and a Foley catheter was placed.  She was then placed in the right-down, left-up lateral decubitus position with hip positioners in place.  The left hip was prepped and draped in the usual sterile fashion.  A mini-incision was used and was made with a #10 blade and centered over the trochanter and in a straight fashion. Dissected down to the level of the fascia lata, incised along the length of the incision, and held in place with a Charnley retractor.  I then raised the flap of vastus lateralis and the anterior half of the gluteus medius and minimus and then removed the short head of the rectus and the anterior hip capsule.  I then dislocated the hip using a template to cut the femoral head, then put the leg in external rotation and extension and held it in place with a Hohmann on the posterior rim of the acetabulum and another Hohmann anteriorly on the anterior lip of the acetabulum.  I then cut out the labrum 360 degrees and reamed sequentially  from a 42 to a 46 and put in a tri-spike 48 mm porous-coated cup.  Then placed a standard 28 head liner in place, then put the leg in flexion and further external rotation to deliver the cut surface of the femoral neck.  Then used a canal finder, a side-riding reamer, and then sequentially reamed from a 9 to a 10.5, and then broached a 10 and an 11 and then placed down a fully porous-coated size 11 stem in approximately 10 degrees of anteversion.  We had excellent stability.  Then trialled various heads and chose a 3.5, cleaned off the Morse taper, and placed on a 3.5 head. We had excellent stability, so we then placed in the real poly liner and trialled once again and took it through an aggressive range of motion, and it was very, very stable.  We irrigated copiously, then repaired the gluteus medius, minimus, and vastus lateralis with drill holes, and further used figure-of-eight Ethibond sutures for a tendon-to-tendon closure.  We then closed the fascia lata with interrupted #1 Vicryl sutures and the deep soft tissues with interrupted 0 Vicryl sutures, subcuticular 2-0 Vicryl suture, and skin staples.  Dressed with Adaptic, 4 x 4s, and sterile Ioban. Dictated by:   Georgena Spurling, M.D. Attending Physician:  Georgena Spurling DD:  03/17/02 TD:  03/19/02 Job: [id number]

## 2011-06-29 ENCOUNTER — Encounter (INDEPENDENT_AMBULATORY_CARE_PROVIDER_SITE_OTHER): Payer: Medicare HMO | Admitting: Ophthalmology

## 2011-06-29 DIAGNOSIS — H43819 Vitreous degeneration, unspecified eye: Secondary | ICD-10-CM

## 2011-06-29 DIAGNOSIS — E11319 Type 2 diabetes mellitus with unspecified diabetic retinopathy without macular edema: Secondary | ICD-10-CM

## 2011-06-29 DIAGNOSIS — H3530 Unspecified macular degeneration: Secondary | ICD-10-CM

## 2011-06-29 DIAGNOSIS — E1139 Type 2 diabetes mellitus with other diabetic ophthalmic complication: Secondary | ICD-10-CM

## 2011-07-11 ENCOUNTER — Encounter (INDEPENDENT_AMBULATORY_CARE_PROVIDER_SITE_OTHER): Payer: Medicare HMO | Admitting: Ophthalmology

## 2011-07-11 DIAGNOSIS — H2510 Age-related nuclear cataract, unspecified eye: Secondary | ICD-10-CM

## 2011-07-25 ENCOUNTER — Ambulatory Visit (HOSPITAL_COMMUNITY)
Admission: RE | Admit: 2011-07-25 | Discharge: 2011-07-25 | Disposition: A | Discharge status: Home / Self Care | Payer: Medicare HMO | Source: Ambulatory Visit | Attending: Ophthalmology | Admitting: Ophthalmology

## 2011-07-25 DIAGNOSIS — H2510 Age-related nuclear cataract, unspecified eye: Secondary | ICD-10-CM

## 2011-07-25 DIAGNOSIS — E119 Type 2 diabetes mellitus without complications: Secondary | ICD-10-CM

## 2011-07-25 DIAGNOSIS — E669 Obesity, unspecified: Secondary | ICD-10-CM | POA: Insufficient documentation

## 2011-07-25 LAB — BASIC METABOLIC PANEL
BUN: 75 mg/dL — ABNORMAL HIGH (ref 6–23)
Creatinine, Ser: 1.86 mg/dL — ABNORMAL HIGH (ref 0.50–1.10)
GFR calc Af Amer: 29 mL/min — ABNORMAL LOW (ref >90)
GFR calc non Af Amer: 25 mL/min — ABNORMAL LOW (ref >90)
Glucose, Bld: 214 mg/dL — ABNORMAL HIGH (ref 70–99)

## 2011-07-25 LAB — CBC
HCT: 37 % (ref 36.0–46.0)
Hemoglobin: 12.5 g/dL (ref 12.0–15.0)
MCH: 33.7 pg (ref 26.0–34.0)
MCHC: 33.8 g/dL (ref 30.0–36.0)
MCV: 99.7 fL (ref 78.0–100.0)
RDW: 14.4 % (ref 11.5–15.5)

## 2011-07-25 LAB — GLUCOSE, CAPILLARY: Glucose-Capillary: 214 mg/dL — ABNORMAL HIGH (ref 70–99)

## 2011-07-26 ENCOUNTER — Encounter (INDEPENDENT_AMBULATORY_CARE_PROVIDER_SITE_OTHER): Payer: Medicare HMO | Admitting: Ophthalmology

## 2011-07-26 DIAGNOSIS — H2700 Aphakia, unspecified eye: Secondary | ICD-10-CM

## 2011-08-01 ENCOUNTER — Inpatient Hospital Stay (INDEPENDENT_AMBULATORY_CARE_PROVIDER_SITE_OTHER): Payer: Medicare HMO | Admitting: Ophthalmology

## 2011-08-01 DIAGNOSIS — H2700 Aphakia, unspecified eye: Secondary | ICD-10-CM

## 2011-08-03 NOTE — Op Note (Signed)
  Donna Hurst, Donna Hurst                 ACCOUNT NO.:  [id number]  MEDICAL RECORD NO.:  [id number]  LOCATION:  SDSC                         FACILITY:  [facility]  PHYSICIAN:  Beulah Gandy. Ashley Royalty, M.D. DATE OF BIRTH:  [birth year]  DATE OF PROCEDURE:  07/25/2011 DATE OF DISCHARGE:                              OPERATIVE REPORT   ADMISSION DIAGNOSIS:  Densed nuclear cataract, left eye.  PROCEDURES:  Cataract extraction with phacoemulsification, anterior vitrectomy, placement of sulcus lens, all left eye.  SURGEON:  Beulah Gandy. Ashley Royalty, MD  ASSISTANT:  Rosalie Doctor, MA.  ANESTHESIA:  Local monitored.  OPERATIVE DETAILS:  After a mild anesthetic, the retrobulbar and Zenaida Niece Lint injection was performed around the globe with 2% lidocaine and 0.75% Marcaine and Wydase 1 mL.  This was injected in the retrobulbar fashion and then in the [facility] fashion into the eyelids.  The anesthesia was allowed to settle for several minutes and then the usual prep and drape was performed.  A lid speculum was placed, 15 blade was used for incision in the cornea at 10 o'clock.  A 3-layered corneoscleral wound was created at 2 o'clock to accommodate the instruments and intraocular lens.  The Provisc was placed in the anterior chamber.  Cystotome was used for incision into the anterior capsule, then the capsular forceps were used to perform a 5-mm anterior capsulectomy.  Hydrodissection was performed.  Phacoemulsification device was used for phacoemulsification in a 2-handed technique with the chop technique.  The nucleus was rotated and flipped into the anterior chamber and the nucleus was carefully removed with phaco frag.  It suddenly became apparent that the posterior capsule was opened.  There was minimal vitreous in the anterior chamber however and anterior vitrectomy was prepared and the anterior vitrectomy instrument was used to remove all vitreous from the anterior segment and from the capsular area and the iris.   Once this was accomplished, a Tecnis AMO implant [id number], 21.5 D, 13 mm in length with a 6-mm optic was chosen, serial #[id number].  This implant was folded and placed into the sulcus behind the posterior chamber with good support from the capsular remnants. Some mild vitreous hemorrhage was occurred and increased infusion was performed until that hemorrhage was stopped.  A 10-0 nylon was placed in the corneoscleral wound at 2 o'clock.  The wound was tested and found to be tight.  Closing pressure was just under 10 mm with a Barraquer tonometer.  Polymyxin and gentamicin were irrigated into tenon space. Marcaine was injected around the globe for postop pain.  Decadron 10 mg was injected to the lower subconjunctival space.  Polysporin ophthalmic ointment, a patch and shield were placed.  The patient was awakened, was elevated on the bed, and taken to recovery in a wheelchair.  COMPLICATIONS:  Anterior vitrectomy.  OPERATIVE TIME:  1 hour.     Beulah Gandy. Ashley Royalty, M.D.     JDM/MEDQ  D:  07/25/2011  T:  07/26/2011  Job:  [id number]  Electronically Signed by Alan Mulder M.D. on 08/03/2011 02:12:15 PM

## 2011-08-23 ENCOUNTER — Encounter (INDEPENDENT_AMBULATORY_CARE_PROVIDER_SITE_OTHER): Payer: Medicare HMO | Admitting: Ophthalmology

## 2011-08-25 ENCOUNTER — Encounter (INDEPENDENT_AMBULATORY_CARE_PROVIDER_SITE_OTHER): Payer: Medicare HMO | Admitting: Ophthalmology

## 2011-08-25 DIAGNOSIS — H2700 Aphakia, unspecified eye: Secondary | ICD-10-CM

## 2011-09-26 ENCOUNTER — Encounter (INDEPENDENT_AMBULATORY_CARE_PROVIDER_SITE_OTHER): Payer: Medicare HMO | Admitting: Ophthalmology

## 2011-09-26 DIAGNOSIS — H2700 Aphakia, unspecified eye: Secondary | ICD-10-CM

## 2011-11-27 ENCOUNTER — Encounter (INDEPENDENT_AMBULATORY_CARE_PROVIDER_SITE_OTHER): Payer: Medicare HMO | Admitting: Ophthalmology

## 2011-11-27 DIAGNOSIS — H43819 Vitreous degeneration, unspecified eye: Secondary | ICD-10-CM

## 2011-11-27 DIAGNOSIS — E1139 Type 2 diabetes mellitus with other diabetic ophthalmic complication: Secondary | ICD-10-CM

## 2011-11-27 DIAGNOSIS — E11319 Type 2 diabetes mellitus with unspecified diabetic retinopathy without macular edema: Secondary | ICD-10-CM

## 2011-11-27 DIAGNOSIS — H3530 Unspecified macular degeneration: Secondary | ICD-10-CM

## 2012-01-01 ENCOUNTER — Encounter (INDEPENDENT_AMBULATORY_CARE_PROVIDER_SITE_OTHER): Payer: Medicare HMO | Admitting: Ophthalmology

## 2012-01-01 DIAGNOSIS — E11319 Type 2 diabetes mellitus with unspecified diabetic retinopathy without macular edema: Secondary | ICD-10-CM

## 2012-01-01 DIAGNOSIS — H3530 Unspecified macular degeneration: Secondary | ICD-10-CM

## 2012-01-01 DIAGNOSIS — H43819 Vitreous degeneration, unspecified eye: Secondary | ICD-10-CM

## 2012-01-01 DIAGNOSIS — E1165 Type 2 diabetes mellitus with hyperglycemia: Secondary | ICD-10-CM

## 2012-01-01 DIAGNOSIS — H2510 Age-related nuclear cataract, unspecified eye: Secondary | ICD-10-CM

## 2012-05-27 ENCOUNTER — Ambulatory Visit (INDEPENDENT_AMBULATORY_CARE_PROVIDER_SITE_OTHER): Payer: Medicare HMO | Admitting: Ophthalmology

## 2012-07-02 ENCOUNTER — Ambulatory Visit (INDEPENDENT_AMBULATORY_CARE_PROVIDER_SITE_OTHER): Payer: Medicare HMO | Admitting: Ophthalmology

## 2012-07-02 DIAGNOSIS — H43819 Vitreous degeneration, unspecified eye: Secondary | ICD-10-CM

## 2012-07-02 DIAGNOSIS — H3530 Unspecified macular degeneration: Secondary | ICD-10-CM

## 2012-07-02 DIAGNOSIS — H35039 Hypertensive retinopathy, unspecified eye: Secondary | ICD-10-CM

## 2012-07-02 DIAGNOSIS — I10 Essential (primary) hypertension: Secondary | ICD-10-CM

## 2012-07-02 DIAGNOSIS — E1139 Type 2 diabetes mellitus with other diabetic ophthalmic complication: Secondary | ICD-10-CM

## 2012-07-02 DIAGNOSIS — E11319 Type 2 diabetes mellitus with unspecified diabetic retinopathy without macular edema: Secondary | ICD-10-CM

## 2012-07-03 ENCOUNTER — Ambulatory Visit (INDEPENDENT_AMBULATORY_CARE_PROVIDER_SITE_OTHER): Payer: Medicare HMO | Admitting: Ophthalmology

## 2013-01-06 ENCOUNTER — Ambulatory Visit (INDEPENDENT_AMBULATORY_CARE_PROVIDER_SITE_OTHER): Payer: Medicare HMO | Admitting: Ophthalmology

## 2013-01-06 DIAGNOSIS — E1139 Type 2 diabetes mellitus with other diabetic ophthalmic complication: Secondary | ICD-10-CM

## 2013-01-06 DIAGNOSIS — H3530 Unspecified macular degeneration: Secondary | ICD-10-CM

## 2013-01-06 DIAGNOSIS — I10 Essential (primary) hypertension: Secondary | ICD-10-CM

## 2013-01-06 DIAGNOSIS — H35039 Hypertensive retinopathy, unspecified eye: Secondary | ICD-10-CM

## 2013-01-06 DIAGNOSIS — E11319 Type 2 diabetes mellitus with unspecified diabetic retinopathy without macular edema: Secondary | ICD-10-CM

## 2013-01-06 DIAGNOSIS — H43819 Vitreous degeneration, unspecified eye: Secondary | ICD-10-CM

## 2013-10-20 ENCOUNTER — Ambulatory Visit (INDEPENDENT_AMBULATORY_CARE_PROVIDER_SITE_OTHER): Payer: Medicare HMO | Admitting: Ophthalmology

## 2013-10-27 ENCOUNTER — Ambulatory Visit (INDEPENDENT_AMBULATORY_CARE_PROVIDER_SITE_OTHER): Payer: Medicare HMO | Admitting: Ophthalmology

## 2013-10-27 DIAGNOSIS — H3530 Unspecified macular degeneration: Secondary | ICD-10-CM

## 2013-10-27 DIAGNOSIS — I10 Essential (primary) hypertension: Secondary | ICD-10-CM

## 2013-10-27 DIAGNOSIS — H43819 Vitreous degeneration, unspecified eye: Secondary | ICD-10-CM

## 2013-10-27 DIAGNOSIS — E1139 Type 2 diabetes mellitus with other diabetic ophthalmic complication: Secondary | ICD-10-CM

## 2013-10-27 DIAGNOSIS — E1165 Type 2 diabetes mellitus with hyperglycemia: Secondary | ICD-10-CM

## 2013-10-27 DIAGNOSIS — E11319 Type 2 diabetes mellitus with unspecified diabetic retinopathy without macular edema: Secondary | ICD-10-CM

## 2013-10-27 DIAGNOSIS — H35039 Hypertensive retinopathy, unspecified eye: Secondary | ICD-10-CM

## 2013-12-15 ENCOUNTER — Other Ambulatory Visit: Payer: Self-pay | Admitting: Internal Medicine

## 2013-12-15 DIAGNOSIS — N189 Chronic kidney disease, unspecified: Secondary | ICD-10-CM

## 2013-12-17 ENCOUNTER — Other Ambulatory Visit: Payer: Medicare HMO

## 2013-12-18 ENCOUNTER — Ambulatory Visit
Admission: RE | Admit: 2013-12-18 | Discharge: 2013-12-18 | Disposition: A | Discharge status: Home / Self Care | Payer: Commercial Managed Care - HMO | Source: Ambulatory Visit | Attending: Internal Medicine | Admitting: Internal Medicine

## 2013-12-18 DIAGNOSIS — N189 Chronic kidney disease, unspecified: Secondary | ICD-10-CM

## 2014-07-29 ENCOUNTER — Ambulatory Visit (INDEPENDENT_AMBULATORY_CARE_PROVIDER_SITE_OTHER): Payer: Commercial Managed Care - HMO | Admitting: Ophthalmology

## 2014-07-29 DIAGNOSIS — H43813 Vitreous degeneration, bilateral: Secondary | ICD-10-CM

## 2014-07-29 DIAGNOSIS — I10 Essential (primary) hypertension: Secondary | ICD-10-CM

## 2014-07-29 DIAGNOSIS — H3531 Nonexudative age-related macular degeneration: Secondary | ICD-10-CM

## 2014-07-29 DIAGNOSIS — E11329 Type 2 diabetes mellitus with mild nonproliferative diabetic retinopathy without macular edema: Secondary | ICD-10-CM

## 2014-07-29 DIAGNOSIS — E11319 Type 2 diabetes mellitus with unspecified diabetic retinopathy without macular edema: Secondary | ICD-10-CM

## 2014-07-29 DIAGNOSIS — H35033 Hypertensive retinopathy, bilateral: Secondary | ICD-10-CM

## 2014-12-15 ENCOUNTER — Inpatient Hospital Stay: Admit: 2014-12-15 | Payer: Self-pay | Admitting: Internal Medicine

## 2014-12-15 ENCOUNTER — Inpatient Hospital Stay (HOSPITAL_COMMUNITY): Payer: Medicare Other

## 2014-12-15 ENCOUNTER — Inpatient Hospital Stay (HOSPITAL_COMMUNITY)
Admission: EM | Admit: 2014-12-15 | Discharge: 2014-12-18 | DRG: 292 | Disposition: A | Discharge status: Home / Self Care | Payer: Medicare Other | Attending: Internal Medicine | Admitting: Internal Medicine

## 2014-12-15 ENCOUNTER — Encounter (HOSPITAL_COMMUNITY): Payer: Self-pay

## 2014-12-15 DIAGNOSIS — N179 Acute kidney failure, unspecified: Secondary | ICD-10-CM | POA: Diagnosis present

## 2014-12-15 DIAGNOSIS — M109 Gout, unspecified: Secondary | ICD-10-CM | POA: Diagnosis present

## 2014-12-15 DIAGNOSIS — I272 Other secondary pulmonary hypertension: Secondary | ICD-10-CM | POA: Diagnosis present

## 2014-12-15 DIAGNOSIS — I129 Hypertensive chronic kidney disease with stage 1 through stage 4 chronic kidney disease, or unspecified chronic kidney disease: Secondary | ICD-10-CM | POA: Diagnosis present

## 2014-12-15 DIAGNOSIS — E1122 Type 2 diabetes mellitus with diabetic chronic kidney disease: Secondary | ICD-10-CM | POA: Diagnosis present

## 2014-12-15 DIAGNOSIS — F329 Major depressive disorder, single episode, unspecified: Secondary | ICD-10-CM | POA: Diagnosis present

## 2014-12-15 DIAGNOSIS — D631 Anemia in chronic kidney disease: Secondary | ICD-10-CM | POA: Diagnosis present

## 2014-12-15 DIAGNOSIS — E11319 Type 2 diabetes mellitus with unspecified diabetic retinopathy without macular edema: Secondary | ICD-10-CM | POA: Diagnosis present

## 2014-12-15 DIAGNOSIS — E871 Hypo-osmolality and hyponatremia: Secondary | ICD-10-CM | POA: Diagnosis present

## 2014-12-15 DIAGNOSIS — R809 Proteinuria, unspecified: Secondary | ICD-10-CM | POA: Diagnosis present

## 2014-12-15 DIAGNOSIS — I872 Venous insufficiency (chronic) (peripheral): Secondary | ICD-10-CM | POA: Diagnosis present

## 2014-12-15 DIAGNOSIS — Z880 Allergy status to penicillin: Secondary | ICD-10-CM | POA: Diagnosis not present

## 2014-12-15 DIAGNOSIS — K5900 Constipation, unspecified: Secondary | ICD-10-CM | POA: Diagnosis present

## 2014-12-15 DIAGNOSIS — E669 Obesity, unspecified: Secondary | ICD-10-CM | POA: Diagnosis present

## 2014-12-15 DIAGNOSIS — E1165 Type 2 diabetes mellitus with hyperglycemia: Secondary | ICD-10-CM | POA: Diagnosis present

## 2014-12-15 DIAGNOSIS — I5033 Acute on chronic diastolic (congestive) heart failure: Principal | ICD-10-CM

## 2014-12-15 DIAGNOSIS — E785 Hyperlipidemia, unspecified: Secondary | ICD-10-CM | POA: Diagnosis present

## 2014-12-15 DIAGNOSIS — I509 Heart failure, unspecified: Secondary | ICD-10-CM

## 2014-12-15 DIAGNOSIS — E039 Hypothyroidism, unspecified: Secondary | ICD-10-CM | POA: Diagnosis present

## 2014-12-15 DIAGNOSIS — R778 Other specified abnormalities of plasma proteins: Secondary | ICD-10-CM

## 2014-12-15 DIAGNOSIS — N189 Chronic kidney disease, unspecified: Secondary | ICD-10-CM

## 2014-12-15 DIAGNOSIS — N184 Chronic kidney disease, stage 4 (severe): Secondary | ICD-10-CM | POA: Diagnosis present

## 2014-12-15 DIAGNOSIS — Z7982 Long term (current) use of aspirin: Secondary | ICD-10-CM | POA: Diagnosis not present

## 2014-12-15 DIAGNOSIS — R0600 Dyspnea, unspecified: Secondary | ICD-10-CM | POA: Diagnosis present

## 2014-12-15 DIAGNOSIS — D509 Iron deficiency anemia, unspecified: Secondary | ICD-10-CM | POA: Diagnosis present

## 2014-12-15 DIAGNOSIS — Z794 Long term (current) use of insulin: Secondary | ICD-10-CM | POA: Diagnosis not present

## 2014-12-15 DIAGNOSIS — E875 Hyperkalemia: Secondary | ICD-10-CM | POA: Diagnosis present

## 2014-12-15 DIAGNOSIS — E1121 Type 2 diabetes mellitus with diabetic nephropathy: Secondary | ICD-10-CM | POA: Diagnosis present

## 2014-12-15 DIAGNOSIS — R0602 Shortness of breath: Secondary | ICD-10-CM | POA: Diagnosis not present

## 2014-12-15 DIAGNOSIS — K219 Gastro-esophageal reflux disease without esophagitis: Secondary | ICD-10-CM | POA: Diagnosis present

## 2014-12-15 DIAGNOSIS — Z6841 Body Mass Index (BMI) 40.0 and over, adult: Secondary | ICD-10-CM | POA: Diagnosis not present

## 2014-12-15 DIAGNOSIS — R7989 Other specified abnormal findings of blood chemistry: Secondary | ICD-10-CM

## 2014-12-15 DIAGNOSIS — N289 Disorder of kidney and ureter, unspecified: Secondary | ICD-10-CM

## 2014-12-15 DIAGNOSIS — M1990 Unspecified osteoarthritis, unspecified site: Secondary | ICD-10-CM | POA: Diagnosis present

## 2014-12-15 HISTORY — DX: Hyperlipidemia, unspecified: E78.5

## 2014-12-15 HISTORY — DX: Essential (primary) hypertension: I10

## 2014-12-15 HISTORY — DX: Anemia in other chronic diseases classified elsewhere: D63.8

## 2014-12-15 HISTORY — DX: Obesity, unspecified: E66.9

## 2014-12-15 HISTORY — DX: Edema, unspecified: R60.9

## 2014-12-15 HISTORY — DX: Unspecified osteoarthritis, unspecified site: M19.90

## 2014-12-15 HISTORY — DX: Gout, unspecified: M10.9

## 2014-12-15 HISTORY — DX: Cardiac murmur, unspecified: R01.1

## 2014-12-15 HISTORY — DX: Secondary hyperparathyroidism of renal origin: N25.81

## 2014-12-15 HISTORY — DX: Chronic diastolic (congestive) heart failure: I50.32

## 2014-12-15 HISTORY — DX: Hypothyroidism, unspecified: E03.9

## 2014-12-15 HISTORY — DX: Chronic kidney disease, stage 4 (severe): N18.4

## 2014-12-15 HISTORY — DX: Type 2 diabetes mellitus with other diabetic kidney complication: E11.29

## 2014-12-15 LAB — CBC
HEMATOCRIT: 27.8 % — AB (ref 36.0–46.0)
HEMOGLOBIN: 9.2 g/dL — AB (ref 12.0–15.0)
MCH: 31.5 pg (ref 26.0–34.0)
MCHC: 33.1 g/dL (ref 30.0–36.0)
MCV: 95.2 fL (ref 78.0–100.0)
Platelets: 401 10*3/uL — ABNORMAL HIGH (ref 150–400)
RBC: 2.92 MIL/uL — ABNORMAL LOW (ref 3.87–5.11)
RDW: 15.3 % (ref 11.5–15.5)
WBC: 11.4 10*3/uL — ABNORMAL HIGH (ref 4.0–10.5)

## 2014-12-15 LAB — SODIUM, URINE, RANDOM: Sodium, Ur: <10 mmol/L

## 2014-12-15 LAB — URINALYSIS, ROUTINE W REFLEX MICROSCOPIC
Bilirubin Urine: NEGATIVE
Glucose, UA: NEGATIVE mg/dL
Hgb urine dipstick: NEGATIVE
Ketones, ur: NEGATIVE mg/dL
Leukocytes, UA: NEGATIVE
NITRITE: NEGATIVE
PH: 5 (ref 5.0–8.0)
Protein, ur: NEGATIVE mg/dL
SPECIFIC GRAVITY, URINE: 1.016 (ref 1.005–1.030)
Urobilinogen, UA: 0.2 mg/dL (ref 0.0–1.0)

## 2014-12-15 LAB — TROPONIN I: Troponin I: 0.21 ng/mL — ABNORMAL HIGH (ref <0.031)

## 2014-12-15 LAB — I-STAT CHEM 8, ED
BUN: 104 mg/dL — AB (ref 6–23)
CREATININE: 3 mg/dL — AB (ref 0.50–1.10)
Calcium, Ion: 1.05 mmol/L — ABNORMAL LOW (ref 1.13–1.30)
Chloride: 93 mmol/L — ABNORMAL LOW (ref 96–112)
GLUCOSE: 416 mg/dL — AB (ref 70–99)
HCT: 33 % — ABNORMAL LOW (ref 36.0–46.0)
HEMOGLOBIN: 11.2 g/dL — AB (ref 12.0–15.0)
POTASSIUM: 5.2 mmol/L — AB (ref 3.5–5.1)
Sodium: 131 mmol/L — ABNORMAL LOW (ref 135–145)
TCO2: 25 mmol/L (ref 0–100)

## 2014-12-15 LAB — TSH: TSH: 1.31 u[IU]/mL (ref 0.350–4.500)

## 2014-12-15 LAB — BRAIN NATRIURETIC PEPTIDE: B Natriuretic Peptide: 1070.9 pg/mL — ABNORMAL HIGH (ref 0.0–100.0)

## 2014-12-15 LAB — I-STAT TROPONIN, ED: Troponin i, poc: 0.22 ng/mL (ref 0.00–0.08)

## 2014-12-15 LAB — CREATININE, SERUM
Creatinine, Ser: 3.03 mg/dL — ABNORMAL HIGH (ref 0.50–1.10)
GFR calc non Af Amer: 13 mL/min — ABNORMAL LOW (ref >90)
GFR, EST AFRICAN AMERICAN: 16 mL/min — AB (ref >90)

## 2014-12-15 LAB — CREATININE, URINE, RANDOM: Creatinine, Urine: 84.27 mg/dL

## 2014-12-15 LAB — GLUCOSE, CAPILLARY: Glucose-Capillary: 234 mg/dL — ABNORMAL HIGH (ref 70–99)

## 2014-12-15 LAB — CBG MONITORING, ED
GLUCOSE-CAPILLARY: 278 mg/dL — AB (ref 70–99)
Glucose-Capillary: 415 mg/dL — ABNORMAL HIGH (ref 70–99)

## 2014-12-15 MED ORDER — INSULIN ASPART 100 UNIT/ML ~~LOC~~ SOLN
0.0000 [IU] | Freq: Three times a day (TID) | SUBCUTANEOUS | Status: DC
  Administered 2014-12-16: 2 [IU] via SUBCUTANEOUS
  Administered 2014-12-17: 5 [IU] via SUBCUTANEOUS

## 2014-12-15 MED ORDER — ATENOLOL 100 MG PO TABS
100.0000 mg | ORAL_TABLET | Freq: Every day | ORAL | Status: DC
  Administered 2014-12-15 – 2014-12-18 (×4): 100 mg via ORAL
  Filled 2014-12-15 (×4): qty 1

## 2014-12-15 MED ORDER — ASPIRIN 81 MG PO CHEW
324.0000 mg | CHEWABLE_TABLET | Freq: Once | ORAL | Status: AC
  Administered 2014-12-15: 324 mg via ORAL
  Filled 2014-12-15: qty 4

## 2014-12-15 MED ORDER — ONDANSETRON HCL 4 MG/2ML IJ SOLN: 4.0000 mg | Freq: Four times a day (QID) | INTRAMUSCULAR | Status: DC | PRN

## 2014-12-15 MED ORDER — ENOXAPARIN SODIUM 30 MG/0.3ML ~~LOC~~ SOLN
30.0000 mg | SUBCUTANEOUS | Status: DC
  Administered 2014-12-15 – 2014-12-16 (×2): 30 mg via SUBCUTANEOUS
  Filled 2014-12-15 (×4): qty 0.3

## 2014-12-15 MED ORDER — ENSURE COMPLETE PO LIQD
237.0000 mL | Freq: Two times a day (BID) | ORAL | Status: DC
  Administered 2014-12-16: 237 mL via ORAL

## 2014-12-15 MED ORDER — FUROSEMIDE 10 MG/ML IJ SOLN
80.0000 mg | Freq: Two times a day (BID) | INTRAMUSCULAR | Status: DC
  Administered 2014-12-15 – 2014-12-16 (×3): 80 mg via INTRAVENOUS
  Filled 2014-12-15 (×5): qty 8

## 2014-12-15 MED ORDER — SIMVASTATIN 80 MG PO TABS: 80.0000 mg | ORAL_TABLET | Freq: Every day | ORAL | Status: DC

## 2014-12-15 MED ORDER — POLYETHYLENE GLYCOL 3350 17 G PO PACK
17.0000 g | PACK | Freq: Every day | ORAL | Status: DC | PRN
  Filled 2014-12-15: qty 1

## 2014-12-15 MED ORDER — PANTOPRAZOLE SODIUM 40 MG PO TBEC
40.0000 mg | DELAYED_RELEASE_TABLET | Freq: Every day | ORAL | Status: DC
  Administered 2014-12-15 – 2014-12-18 (×4): 40 mg via ORAL
  Filled 2014-12-15 (×2): qty 1

## 2014-12-15 MED ORDER — ALLOPURINOL 100 MG PO TABS
100.0000 mg | ORAL_TABLET | Freq: Every day | ORAL | Status: DC
  Administered 2014-12-16 – 2014-12-18 (×3): 100 mg via ORAL
  Filled 2014-12-15 (×4): qty 1

## 2014-12-15 MED ORDER — ACETAMINOPHEN 325 MG PO TABS: 650.0000 mg | ORAL_TABLET | Freq: Four times a day (QID) | ORAL | Status: DC | PRN

## 2014-12-15 MED ORDER — ACETAMINOPHEN 650 MG RE SUPP: 650.0000 mg | Freq: Four times a day (QID) | RECTAL | Status: DC | PRN

## 2014-12-15 MED ORDER — LEVOTHYROXINE SODIUM 25 MCG PO TABS
25.0000 ug | ORAL_TABLET | Freq: Every day | ORAL | Status: DC
  Administered 2014-12-16 – 2014-12-18 (×3): 25 ug via ORAL
  Filled 2014-12-15 (×4): qty 1

## 2014-12-15 MED ORDER — ATORVASTATIN CALCIUM 40 MG PO TABS
40.0000 mg | ORAL_TABLET | Freq: Every day | ORAL | Status: DC
  Administered 2014-12-16 – 2014-12-17 (×2): 40 mg via ORAL
  Filled 2014-12-15 (×3): qty 1

## 2014-12-15 MED ORDER — SODIUM CHLORIDE 0.9 % IJ SOLN
3.0000 mL | Freq: Two times a day (BID) | INTRAMUSCULAR | Status: DC
  Administered 2014-12-15 – 2014-12-18 (×6): 3 mL via INTRAVENOUS

## 2014-12-15 MED ORDER — INSULIN ASPART 100 UNIT/ML ~~LOC~~ SOLN
4.0000 [IU] | Freq: Three times a day (TID) | SUBCUTANEOUS | Status: DC
  Administered 2014-12-16 – 2014-12-17 (×6): 4 [IU] via SUBCUTANEOUS

## 2014-12-15 MED ORDER — MIRTAZAPINE 15 MG PO TABS
15.0000 mg | ORAL_TABLET | Freq: Every day | ORAL | Status: DC
  Administered 2014-12-15 – 2014-12-17 (×3): 15 mg via ORAL
  Filled 2014-12-15 (×4): qty 1

## 2014-12-15 MED ORDER — DOCUSATE SODIUM 100 MG PO CAPS
100.0000 mg | ORAL_CAPSULE | Freq: Two times a day (BID) | ORAL | Status: DC
  Administered 2014-12-15 – 2014-12-18 (×6): 100 mg via ORAL
  Filled 2014-12-15 (×9): qty 1

## 2014-12-15 MED ORDER — ASPIRIN 325 MG PO TABS
325.0000 mg | ORAL_TABLET | Freq: Every day | ORAL | Status: DC
  Administered 2014-12-16 – 2014-12-18 (×3): 325 mg via ORAL
  Filled 2014-12-15 (×3): qty 1

## 2014-12-15 MED ORDER — ONDANSETRON HCL 4 MG PO TABS: 4.0000 mg | ORAL_TABLET | Freq: Four times a day (QID) | ORAL | Status: DC | PRN

## 2014-12-15 MED ORDER — INSULIN DETEMIR 100 UNIT/ML ~~LOC~~ SOLN
30.0000 [IU] | Freq: Every day | SUBCUTANEOUS | Status: DC
  Administered 2014-12-15: 30 [IU] via SUBCUTANEOUS
  Filled 2014-12-15 (×2): qty 0.3

## 2014-12-15 MED ORDER — SODIUM CHLORIDE 0.9 % IV SOLN
INTRAVENOUS | Status: DC
  Administered 2014-12-15: 21:00:00 via INTRAVENOUS

## 2014-12-15 MED ORDER — INSULIN ASPART 100 UNIT/ML ~~LOC~~ SOLN
10.0000 [IU] | Freq: Once | SUBCUTANEOUS | Status: AC
  Administered 2014-12-15: 10 [IU] via SUBCUTANEOUS
  Filled 2014-12-15: qty 1

## 2014-12-15 MED ORDER — ADULT MULTIVITAMIN W/MINERALS CH
1.0000 | ORAL_TABLET | Freq: Every day | ORAL | Status: DC
  Administered 2014-12-15 – 2014-12-18 (×4): 1 via ORAL
  Filled 2014-12-15 (×4): qty 1

## 2014-12-15 MED ORDER — ZOLPIDEM TARTRATE 5 MG PO TABS: 5.0000 mg | ORAL_TABLET | Freq: Every evening | ORAL | Status: DC | PRN

## 2014-12-15 NOTE — ED Notes (Signed)
Reported attempted x2.

## 2014-12-15 NOTE — ED Provider Notes (Signed)
CSN: [id number]     Arrival date & time 12/15/14  1253 History   First MD Initiated Contact with Patient 12/15/14 1403     Chief Complaint  Patient presents with  . Shortness of Breath     (Consider location/radiation/quality/duration/timing/severity/associated sxs/prior Treatment) HPI Complains of shortness of breath progressively worsening onset 6 days ago. Shortness of breath is worse with exertion or with lying flat. She denies chest pain denies fever denies cough. Seen at [facility] medical office, Dr. Wylene Simmer earlier this morning and sent here for further evaluation and treatment. Lab work from office this morning remarkable for glucose 465, BUN 110, creatinine 3.4. CBC remarkable for hemoglobin 9.3 hematocrit 28.6, otherwise normal History reviewed. No pertinent past medical history. History reviewed. No pertinent past surgical history. past medical history diabetes, renal insufficiency, degenerative arthritis No family history on file. History  Substance Use Topics  . Smoking status: Never Smoker   . Smokeless tobacco: Not on file  . Alcohol Use: No   occasional alcohol use OB History    No data available     Review of Systems  Respiratory: Positive for shortness of breath.   Cardiovascular: Positive for leg swelling.  All other systems reviewed and are negative.     Allergies  Penicillins  Home Medications   Prior to Admission medications   Not on File   BP 100/45 mmHg  Pulse 59  Temp(Src) 97.5 F (36.4 C) (Oral)  Resp 22  SpO2 99% Physical Exam  Constitutional: She appears well-developed and well-nourished. No distress.  HENT:  Head: Normocephalic and atraumatic.  Eyes: Conjunctivae are normal. Pupils are equal, round, and reactive to light.  Neck: Neck supple. No JVD present. No tracheal deviation present. No thyromegaly present.  Cardiovascular: Normal rate and regular rhythm.   No murmur heard. Pulmonary/Chest: Effort normal and breath sounds normal.   Abdominal: Soft. Bowel sounds are normal. She exhibits no distension. There is no tenderness.  Obese  Musculoskeletal: Normal range of motion. She exhibits edema. She exhibits no tenderness.  2+ pretibial pitting edema bilaterally  Neurological: She is alert. Coordination normal.  Skin: Skin is warm and dry. No rash noted.  Psychiatric: She has a normal mood and affect.  Nursing note and vitals reviewed.   ED Course  Procedures (including critical care time) Labs Review Labs Reviewed  CBG MONITORING, ED - Abnormal; Notable for the following:    Glucose-Capillary 415 (*)    All other components within normal limits    Imaging Review No results found.   EKG Interpretation   Date/Time:  Tuesday December 15 2014 13:51:23 EST Ventricular Rate:  59 PR Interval:  184 QRS Duration: 90 QT Interval:  450 QTC Calculation: 445 R Axis:   -84 Text Interpretation:  Sinus bradycardia Left axis deviation Low voltage  QRS Septal infarct , age undetermined Abnormal ECG Confirmed by Ethelda Chick   MD, [place] Mefferd [phone]) on 12/15/2014 2:26:28 PM     Chest xray viewed by me. Results for orders placed or performed during the hospital encounter of 12/15/14  CBG monitoring, ED  Result Value Ref Range   Glucose-Capillary 415 (H) 70 - 99 mg/dL  I-stat chem 8, ed  Result Value Ref Range   Sodium 131 (L) 135 - 145 mmol/L   Potassium 5.2 (H) 3.5 - 5.1 mmol/L   Chloride 93 (L) 96 - 112 mmol/L   BUN 104 (H) 6 - 23 mg/dL   Creatinine, Ser 1.91 (H) 0.50 - 1.10 mg/dL   Glucose,  Bld 416 (H) 70 - 99 mg/dL   Calcium, Ion 1.611.05 (L) 1.13 - 1.30 mmol/L   TCO2 25 0 - 100 mmol/L   Hemoglobin 11.2 (L) 12.0 - 15.0 g/dL   HCT 09.633.0 (L) 04.536.0 - 40.946.0 %  I-stat troponin, ED  Result Value Ref Range   Troponin i, poc 0.22 (HH) 0.00 - 0.08 ng/mL   Comment NOTIFIED PHYSICIAN    Comment 3           Dg Chest Port 1 View  12/15/2014   CLINICAL DATA:  81 year old female with shortness of breath and lower extremity swelling.  Initial encounter.  EXAM: PORTABLE CHEST - 1 VIEW  COMPARISON:  12/05/2010 and earlier.  FINDINGS: Portable AP semi upright view at 1432 hrs. Stable cardiac size allowing for portable technique. Other mediastinal contours are within normal limits. Calcified atherosclerosis of the aorta. No pneumothorax. Increased pulmonary vascularity. No definite effusion. Superimposed perihilar opacity favored to be atelectasis. No consolidation.  IMPRESSION: Increased pulmonary vascularity compatible with mild interstitial edema. Increased perihilar opacity, favor atelectasis.   Electronically Signed   By: Odessa FlemingH  Hall M.D.   On: 12/15/2014 14:48    MDM  Patient felt to be clinically in CHF with worsening renal insufficiency, hyperglycemia. He was to be a direct admit, no beds available presently. I spoke with Dr. Wylene SimmerISOVEC who saw patient in office immediately prior to coming here in is placing admission orders Note i-STAT troponin because 0.22, elevated, consistent with congestive heart failure or N STEMI. Aspirin ordered Final diagnoses:  None        Doug SouSam Sehaj Kolden, MD 12/15/14 [phone]

## 2014-12-15 NOTE — Consult Note (Signed)
Cardiology Consultation Note  Patient ID: Donna Hurst, MRN: [id number], DOB/AGE: August 03, 1932 81 y.o. Admit date: 12/15/2014   Date of Consult: 12/15/2014 Primary Physician: Tisovec Primary Cardiologist: Dr. 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 No hx of MI or ischemic eval.  She has not been followed in the outpatient cardiology setting since hospital admission for diastolic CHF in 2011. She takes Lasix regularly and is followed by Dr. Abel Presto. Baseline Cr per renal note 1.5-2.2. She is very clear in her wishes that she does not want chronic dialysis and understands the implications. She even went to classes to learn more, but ultimately decided not to pursue access because "at 69, I've lived a good life." She saw her PCP last week for worsening dyspnea and said she was prescribed an inhaler. Last night, however, she had worsening dyspnea/orthopnea prompting her to seek recheck today. She now becomes dyspneic and diaphoretic with even minimal activity. Reports chronic LEE. No chest pain, palpitations, nausea, vomiting, or syncope. Compliant with Lasix. Does not drink a lot of fluid. Moderate salt intake. Says she's lost a few pounds over the last few months but no significant weight change. PCP was concerned about clinical status thus referred her to the ER.   Labs in ER revealed glucose 416, Na 131 (possibly pseudohyponatremia), K 5.2, BUN 104, Cr 3.00, troponin 0.22, Hgb 11.2. CXR - increased pulmonary vascularity compatible with mild interstitial edema. Renal US - bilateral renal cortical thinning consistent with chronic renal disease; cholelithasis. VSS in the ER with controlled HR, SBP  variable from 98/67->139/39. ED administered  ASA and 10 units of insulin. Nephrology has seen and agrees with the  IV BID ordered by primary care.  Past Medical History  Diagnosis Date  . Hypertension   . Diabetes mellitus with renal manifestation   . CKD (chronic kidney disease), stage IV   . Secondary hyperparathyroidism   . Anemia of chronic disease   . Obesity   . Dyslipidemia   . Hypothyroidism   . Chronic diastolic CHF (congestive heart failure)   . Gout   . Osteoarthritis   . Edema       Most Recent Cardiac Studies: 2D Echo 01/2010: poor acoustic quality, normal wall thickness, EF 60-65%, mild-mod TR, PASP .   Surgical History: History reviewed. No pertinent past surgical history.   Home Meds: Not yet clarified by pharmacy. Patient's husband will be bringing meds up to the hospital. She recalls: Allopurinol Lasix  BID Simvastatin Norvasc Aspirin  daily Calcium Magnesium Preservision  Inpatient Medications:  ASA, 10 units of insulin   Allergies:  Allergies  Allergen Reactions  . Penicillins Hives and Swelling    History   Social History  . Marital Status: Married    Spouse Name: Donna Hurst  . Number of Children: Donna Hurst  . Years of Education: Donna Hurst   Occupational History  . Retired Engineer, civil (consulting)    Social History Main Topics  . Smoking status: Never Smoker   . Smokeless tobacco: Not on file  . Alcohol Use: No  . Drug Use: No  . Sexual Activity: Not on file   Other Topics Concern  . Not on file   Social  History Narrative     Family History  Problem Relation Age of Onset  . CAD Father     Died of MI   . CAD Brother     s/p CABG  . CAD Brother     Died at 60 of MI  . Diabetes Mother   . Hypertension Mother      Review of Systems: General: negative for chills, fever.  Cardiovascular: see above Dermatological: negative for rash Respiratory: negative for cough or wheezing Urologic: negative for hematuria Abdominal: negative  for nausea, vomiting, diarrhea, bright red blood per rectum, melena, or hematemesis Neurologic: negative for visual changes, syncope, or dizziness All other systems reviewed and are otherwise negative except as noted above.  Labs:  Lab Results  Component Value Date   WBC 12.9* 07/25/2011   HGB 11.2* 12/15/2014   HCT 33.0* 12/15/2014   MCV 99.7 07/25/2011   PLT 255 07/25/2011    Recent Labs Lab 12/15/14 1431  NA 131*  K 5.2*  CL 93*  BUN 104*  CREATININE 3.00*  GLUCOSE 416*   Radiology/Studies:  Dg Chest Port 1 View  12/15/2014   CLINICAL DATA:  81 year old female with shortness of breath and lower extremity swelling. Initial encounter.  EXAM: PORTABLE CHEST - 1 VIEW  COMPARISON:  12/05/2010 and earlier.  FINDINGS: Portable AP semi upright view at 1432 hrs. Stable cardiac size allowing for portable technique. Other mediastinal contours are within normal limits. Calcified atherosclerosis of the aorta. No pneumothorax. Increased pulmonary vascularity. No definite effusion. Superimposed perihilar opacity favored to be atelectasis. No consolidation.  IMPRESSION: Increased pulmonary vascularity compatible with mild interstitial edema. Increased perihilar opacity, favor atelectasis.   Electronically Signed   By: Odessa Fleming M.D.   On: 12/15/2014 14:48    Wt Readings from Last 3 Encounters:  No data found for Wt    EKG: sinus bradycardia 59bpm, left axis deviation, septal infarct age undetermined, nonspecific ST-T changes; grossly similar to 2012  Physical Exam: Blood pressure 119/48, pulse 59, temperature 97.5 F (36.4 C), temperature source Oral, resp. rate 15, SpO2 97 %. General: Well developed, well nourished, in no acute distress. Head: Normocephalic, atraumatic, sclera non-icteric, no xanthomas, nares are without discharge.  Neck: JVD elevated up to jawline. Lungs: Coarse at the bases, no wheezes or rhonchi. Breathing is unlabored. Heart: RRR with S1 S2. Soft SEM across precordium,  also RUSB. No rubs or gallops appreciated. Abdomen: Soft, non-tender, non-distended with normoactive bowel sounds. No hepatomegaly. No rebound/guarding. No obvious abdominal masses. Msk:  Strength and tone appear normal for age. Extremities: No clubbing or cyanosis. 1+ soft BLE edema.  Distal pedal pulses are 2+ and equal bilaterally. Neuro: Alert and oriented X 3. No facial asymmetry. No focal deficit. Moves all extremities spontaneously. Psych:  Responds to questions appropriately with a normal affect.    Assessment and Plan:  1. Acute on chronic diastolic CHF in the setting of acute on chronic kidney disease 2. Hypertension, controlled 3. Mildly elevated troponin 4. Dyslipidemia 5. Anemia of chronic disease  Agree with plan as outlined by other services. Diurese with IV Lasix and follow renal function closely. BP controlled, so volume overload likely mediated by advanced kidney disease. Await echo result. Troponin mildly elevated but do not suspect an ACS. She denies any chest pain. She is not a candidate for ischemic evaluation due to inability to perform cardiac cath due to risk of requiring HD, which she is firm in refusing. Would treat medically with continuation of  aspirin. Husband is bringing home meds in so we can review. See below for additional thoughts.   Code status discussed and the patient wishes to remain full code, but does not want prolonged support if prognosis is poor.  Signed, Ronie Spiesayna Dunn PA-C 12/15/2014, 5:42 PM   Patient seen and examined with Ronie Spiesayna Dunn, PA-C. We discussed all aspects of the encounter. I agree with the assessment and plan as stated above.   Unclear if reason for decompensation is worsening renal function or cardiac in nature. Given long h/o DM2 she is at risk for CAD and silent ischemia but cardiac exam relatively normal and my suspicion for severe cardiomyopathy is low. Agree with IV lasix and echo. We will watch renal function closely. She refuses  dialysis.   Truman Haywardaniel Eshal Propps,MD 6:21 PM

## 2014-12-15 NOTE — ED Notes (Signed)
Pt transported to US

## 2014-12-15 NOTE — Consult Note (Addendum)
Nephrology consultation note  Reason for Consult: Acute renal failure on chronic kidney disease stage III-IV Referring Physician: Guerry Bruinichard Tisovec M.D. [facility])  History of present illness: 81 year old Caucasian woman with past medical history significant for chronic kidney disease stage IV that appears to be from underlying diabetes (with retinopathy/nephropathy) and hypertension. Donna Hurst has a baseline creatinine ranging 1.5-2.2 and follows periodically with Dr.Coladonato. Donna Hurst also has history of congestive heart failure and DJD with obesity. Donna Hurst presented today to Dr. Deneen Hartsisovec's office with a 1-2 week history of progressively worsening shortness of breath, worsening pedal edema with associated orthopnea and exertional dyspnea/poor strength in the lower extremities. Donna Hurst also reports some recently emerging dysuria but without any frequency-overall reports urinary quantity is diminishing.  Donna Hurst denies any chest pain or orthostatic symptoms. Denies any nausea, vomiting or diarrhea but has poor appetite and some dysgeusia (both unusual for Donna Hurst). Donna Hurst denies any flank pain, fever or chills. Donna Hurst has sporadic cough without any sputum production. Donna Hurst denies the use of any nonsteroidal anti-inflammatory drugs, recent exposure to intravenous contrast or recent antibiotic use. Donna Hurst thought that Donna Hurst was going to be seen today for an URI but at the office had a chest x-ray revealing pulmonary edema/increased interstitial markings and labs that showed elevated creatinine of 3.4 (up from 1.7 about 3 weeks ago). 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 Father died at age of 79 from MI, mother with a history of diabetes/hypertension/heart disease Brother with a history of coronary artery disease status post CABG-one brother died at age of 79 from MI   Medications: Furosemide 40 mg twice a day Amlodipine 5 mg daily at bedtime Calcium carbonate 500 mg daily Omeprazole 20 mg daily when necessary Simvastatin 80 mg daily Magnesium oxide 400 mg daily Levemir 36 units subcutaneously daily NovoLog 10 units with meals plus sliding scale Atenolol 100 mg daily Aspirin 81 mg daily Allopurinol 300 mg daily Synthroid 25 g daily   Allergies: Penicillins   Review of systems: General: Denies any fevers or chills but has had recent fatigue and anorexia HEENT: Denies epistaxis, sinusitis symptoms, rhinitis or tinnitus CVS: See history of present illness above Respiratory: See history of present illness above-denies sputum/hemoptysis Gastrointestinal: See history of present illness above, recent anorexia/dysgeusia without dysphagia Genitourinary: Recent dysuria and diminishing urinary output, denies hematuria, urgency or incontinence Neurological: Denies any recurrent headaches, focal weakness or numbness-reports poor balance/difficulty with gait over the past 2 weeks Psychiatry: Denies recent depression/anxiety Skin: Denies any rash Musculoskeletal: Occasional arthralgias of large joints otherwise denies any focal joint swelling/stiffness  BP 139/39 mmHg  Pulse 54  Temp(Src) 97.5 F (36.4 C) (Oral)  Resp 20  SpO2 95%  Physical Exam  Constitutional: Donna Hurst is oriented to person, place, and time. Donna Hurst appears well-developed and well-nourished. No distress.  HENT:  Head: Normocephalic and atraumatic.  Dry oropharynx  Eyes: EOM are normal. Pupils are equal, round, and reactive to light. No scleral icterus.  Neck:  Normal range of motion. Neck supple. JVD present. No thyromegaly present.  8 cm JVD  Cardiovascular: Regular rhythm and normal heart sounds.  Exam reveals  no friction rub.   No murmur heard. Regular bradycardia  Pulmonary/Chest: Effort normal. Donna Hurst has rales.  Fine left base rales otherwise clear to auscultation  Abdominal: Soft. Bowel sounds are normal. Donna Hurst exhibits no distension and no mass. There is no tenderness.  Musculoskeletal: Normal range of motion. Donna Hurst exhibits edema. Donna Hurst exhibits no tenderness.  1-2+ lower extremity edema up to knees  Lymphadenopathy:    Donna Hurst has no cervical adenopathy.  Neurological: Donna Hurst is alert and oriented to person, place, and time. Donna Hurst displays normal reflexes. No cranial nerve deficit.  No asterixis  Skin: Skin is warm and dry. No pallor.  Psychiatric: Donna Hurst has a normal mood and affect. Donna Hurst behavior is normal.  Nursing note and vitals reviewed.  BMET    Component Value Date/Time   NA 131* 12/15/2014 1431   K 5.2* 12/15/2014 1431   CL 93* 12/15/2014 1431   CO2 30 07/25/2011 0915   GLUCOSE 416* 12/15/2014 1431   BUN 104* 12/15/2014 1431   CREATININE 3.00* 12/15/2014 1431   CALCIUM 10.7* 07/25/2011 0915   GFRNONAA 25* 07/25/2011 0915   GFRAA 29* 07/25/2011 0915    CBC    Component Value Date/Time   WBC 12.9* 07/25/2011 0915   RBC 3.71* 07/25/2011 0915   RBC 3.30* 01/26/2010 0530   HGB 11.2* 12/15/2014 1431   HCT 33.0* 12/15/2014 1431   PLT 255 07/25/2011 0915   MCV 99.7 07/25/2011 0915   MCH 33.7 07/25/2011 0915   MCHC 33.8 07/25/2011 0915   RDW 14.4 07/25/2011 0915   LYMPHSABS 1.1 01/21/2010 1809   MONOABS 1.4* 01/21/2010 1809   EOSABS 0.2 01/21/2010 1809   BASOSABS 0.0 01/21/2010 1809    Assessment/plan: 1. Acute renal failure on chronic kidney disease stage IV: This appears to be hemodynamically mediated with what appears to be CHF exacerbation (I do not have access to Donna Hurst ejection fraction-suspected diastolic heart failure with  advanced chronic kidney disease). From Donna Hurst clinical history, Donna Hurst also voices poor oral intake and may indeed have intravascular volume contraction however at this time, diuresis is clinically warranted to alleviate Donna Hurst pulmonary edema/improve respiratory symptoms- Agree with furosemide  IV BID at this time. Urinalysis and urine electrolytes will be checked and diuretic therapy undertaken with close monitoring of input/output. Will adjust medications in accordance to renal function and avoid critical hypotensive drops/exposure to potential nephrotoxic medications and procedures. Donna Hurst is unsure whether Donna Hurst would want temporary dialysis at this time. 2. CHF exacerbation: Unclear reason why Donna Hurst had exacerbation of congestive heart failure given good compliance with diuretics and low sodium diet. Will investigate for possible infections including a urinary tract infection. 3. Anemia of chronic kidney disease: Donna Hurst hemoglobin at this time, no indications for ESA therapy. 4. Hyponatremia: This appears to be bi-factorial from pseudohyponatremia (hyperglycemia) and free water excretion defect of acute renal failure and CHF exacerbation. Monitor with diuresis. 5. Hyperkalemia: Secondary to hyperglycemia, monitor with glycemic control per primary service 6. Insulin-dependent diabetes mellitus: Management per primary service

## 2014-12-15 NOTE — ED Notes (Addendum)
Bed control called to see about bed request.

## 2014-12-15 NOTE — ED Notes (Signed)
CBG=278 

## 2014-12-15 NOTE — ED Notes (Signed)
[facility] He states he will call me when the room is clean and ready for pt.

## 2014-12-15 NOTE — ED Notes (Signed)
Report attempted x3. Nurse states room is Dirty. Charge Nurse Italychad made aware.

## 2014-12-15 NOTE — Progress Notes (Signed)
Patient's husband arrived with patient's home medications.  RN notified pharmacy as requested.

## 2014-12-15 NOTE — ED Notes (Signed)
Attempted report. Nurse to call back.

## 2014-12-15 NOTE — ED Notes (Signed)
MD at bedside. 

## 2014-12-15 NOTE — ED Notes (Signed)
Patient was sent to the ER with shortness of breath after eval at the office of [facility].  Her BUN is 110 and her Cr is up to 3.4, with what appears to be fluid overload on her CXR.  Attempt was made to directly admit her to a telemetry bed from the office, but [facility] was unable to accommodate this request, thus necessitating that she be sent to the ER for further stabilization and admission.    Patient: Donna Hurst ID: [id number] Note: All result statuses are Final unless otherwise noted.  Tests: (1) BASIC METABOLIC PANEL (0086)   GLUCOSE              [HH] 465 mg/dl                   60-110   BUN                  [HH] 110 mg/dl                   5-23     RES=RESULT VERIFIED AND REPORTED TO PHYSICIAN   CREATININE           [HH] 3.4 mg/dl                   0.3-1.5     RES=RESULT VERIFIED AND REPORTED TO PHYSICIAN  eGFR Non-African American                             13.0  eGFR African American                             15.7   SODIUM               [L]  134 mEq/L                   135-148   POTASSIUM            [H]  5.6 mEq/L                   3.5-5.3   CHLORIDE                  95 mEq/L                    80-111   CO2                       26 mEq/L                    15-35   CALCIUM                   9.1 mg/dL                   7.0-10.5  Tests: (2) CBC (2000)   WBC                       9.80 K/uL                   4.10-10.90   LYM                       1.6 K/uL  0.6-4.1 ! MID                       0.8 K/uL                    0.0-1.8   GRAN                      7.4 K/uL                    2.0-7.8   LYM%                      16.4 %                      10.0-58.5 ! MID%                      8.2 %                       0.1-24.0   GRAN%                     75.4                        37.0-92.0   RBC                  [L]  2.8 M/uL                    4.2-6.3   HGB                  [L]  9.3 g/dL                    12.0-18.0   HCT                  [L]   28.6 %                      37.0-51.0   MCV                  [H]  101.3 fL                    80.0-97.0   MCH                  [H]  33.0 pg                     26.0-32.0   MCHC                      32.5 g/dL                   31.0-36.0   PLT                  [H]  443 K/uL                    140-440  Note: An exclamation mark (!) indicates a result that was not dispersed into the flowsheet. Document Creation Date: 12/15/2014 12:02 PM

## 2014-12-15 NOTE — H&P (Signed)
PCP:  Gaspar Garbe  MD  Chief Complaint:  SOB  HPI: Vital Signs  Entered weight:  200  lbs., Calculated Weight: 200 lbs., ( 90.72 kg) Height: 59 in., ( 149.86 cm) Temperature: 98.3 deg F, Temperature site: oral Pulse rate: 60 Pulse rhythm: regular  Blood Pressure #1: 108 / 46 mm Hg    BMI: 40.40 BSA: 1.84 Wt Chg: 0 lbs since 11/27/2014  Vitals entered by: Inocente Salles, CMA on December 15, 2014 10:28 AM  Pulse Oximetry  O2 Saturation: 93 %      Risk Factors:   Smoked Tobacco Use:  Former smoker    Cigarettes:  Yes     Pack-years:  1 pack       Year quit:  1985       Years Since Last Quit:  31 Smokeless Tobacco Use:  Never Passive smoke exposure:  no Drug use:  no HIV high-risk behavior:  no Caffeine use:  <1 drinks per day Alcohol use:  yes    Type:  Wine    Drinks per day:  <1    Has patient --       Felt need to cut down:  no       Been annoyed by complaints:  no       Felt guilty about drinking:  no       Needed eye opener in the morning:  no    Counseled to quit/cut down alcohol use:  yes Exercise:  no Seatbelt use:  100 % Sun Exposure:  rarely  Previous Tobacco Use: Signed On - 11/27/2014 Smoked Tobacco Use:  Former smoker    Cigarettes:  Yes     Pack-years:  1 pack       Year quit:  1985       Years Since Last Quit:  31 years, 2 months, 7 days Smokeless Tobacco Use:  Never    Counseled to quit/cut down:  No Passive smoke exposure:  no Drug use:  no HIV high-risk behavior:  no Caffeine use:  <1 drinks per day  Previous Alcohol Use: Signed On - 11/27/2014 Alcohol use:  yes    Type:  Wine    Drinks per day:  <1    Has patient --       Felt need to cut down:  no       Been annoyed by complaints:  no       Felt guilty about drinking:  no       Needed eye opener in the morning:  no    Counseled to quit/cut down alcohol use:  yes Exercise:  no Seatbelt use:  100 % Sun Exposure:  rarely  Colonoscopy History:    Date of Last  Colonoscopy:  08/21/2013  Mammogram History:    Date of Last Mammogram:  08/21/2013  PAP Smear History:    Date of Last PAP Smear:  08/21/2013  History of Present Illness  History from: patient Reason for visit: See chief complaint Chief Complaint: Patient has nonproductive cough, SOB, she cant lay down at night due to SOB and chest congestion. Any exertion causes the dyspnea and she sweats a lot at night but doesnt know of any fever. Patient says symptoms have been going x 1 week.  History of Present Illness: patient presents with 1 week of cough and sob.  Unable to sleep, eat or lie down due to the sob.  Had nasal congestion in the beginning but not now.  She is sweating and sob at rest now.  She is making less urine she states then at baseline.  She is taking lasix 40mg  qam and 40mg pm for some time now.  Denies chills, denies congestion, ha, sore throat.   She does not want to go to the hospital.     Review of Systems  General:       Complains of sweats, anorexia, fatigue, malaise, sleep disturbance.        Denies chills, weight loss.   Ears/Nose/Throat:       Complains of nasal congestion.        Denies earache, sore throat, hoarseness.   Cardiovascular:       Complains of dyspnea on exertion, orthopnea, peripheral edema.        Denies palpitations, syncope.   Respiratory:       Complains of see HPI, cough, dyspnea.        Denies excessive sputum, hemoptysis, wheezing.   Gastrointestinal:       Denies nausea, vomiting.    Past History Past Medical History (reviewed - no changes required): Chronic renal insufficiency, stage 4, [facility] HTN Hyperlipidemia Osteoarthritis back/knees CHF exacerbation following back surgery and discontinuation of diuretics in [place] 2011 Venous insufficiency w/leg pain Diabetes Mellitus Type 2 Hypothyroid 2013 SHingles, L C5-6 dermatome 06/2014    Physical Exam  General appearance: elderly, obese, in wc, clammy/sweaty, labored  breathing  Eyes  External: conjunctivae and lids normal  Ears, Nose and Throat  External ears: normal, no lesions or deformities External nose: normal, no lesions or deformities Otoscopic: canals clear, tympanic membranes intact, no fluid Hearing: grossly intact Dental: good dentition Pharynx: mild pharyngeal erythema  Neck  Neck: supple, no masses, trachea midline  Respiratory  Respiratory effort: no intercostal retractions or use of accessory muscles Auscultation: rales throughout 1/2 way up lungs, no wheezing, labored  Cardiovascular  Auscultation: S1, S2, no murmur, rub, or gallop Periph. circulation: 2+ bilat edema  Musculoskeletal  Gait and station: normal  Mental Status Exam  Judgment, insight: intact Orientation: oriented to time, place, and person Memory: intact Mood and affect: Normal Mood    Medications: Prior to Admission medications   Not on File    Allergies:   Allergies  Allergen Reactions  . Penicillins Hives and Swelling    Social History:  reports that she has never smoked. She does not have any smokeless tobacco history on file. She reports that she does not drink alcohol or use illicit drugs.  Family History: No family history on file.  Physical Exam: Filed Vitals:   12/15/14 1500 12/15/14 1515 12/15/14 1545 12/15/14 1615  BP: 122/34 126/38 118/49 139/39  Pulse: 58 57 53 54  Temp:      TempSrc:      Resp: 19 23 20 20   SpO2: 94% 94% 94% 95%    Labs on Admission:   Recent Labs  12/15/14 1431  NA 131*  K 5.2*  CL 93*  GLUCOSE 416*  BUN 104*  CREATININE 3.00*    Recent Labs  12/15/14 1431  HGB 11.2*  HCT 33.0*    Lab Results  Component Value Date   INR 1.22 01/21/2010    Radiological Exams on Admission: Dg Chest Port 1 View  12/15/2014   CLINICAL DATA:  81 year old female with shortness of breath and lower extremity swelling. Initial encounter.  EXAM: PORTABLE CHEST - 1 VIEW  COMPARISON:  12/05/2010 and earlier.   FINDINGS: Portable AP semi upright view at  1432 hrs. Stable cardiac size allowing for portable technique. Other mediastinal contours are within normal limits. Calcified atherosclerosis of the aorta. No pneumothorax. Increased pulmonary vascularity. No definite effusion. Superimposed perihilar opacity favored to be atelectasis. No consolidation.  IMPRESSION: Increased pulmonary vascularity compatible with mild interstitial edema. Increased perihilar opacity, favor atelectasis.   Electronically Signed   By: Odessa Fleming M.D.   On: 12/15/2014 14:48   Orders placed or performed during the hospital encounter of 12/15/14  . ED EKG  . ED EKG    Assessment/Plan Active Problems:   Acute renal disease   Dyspnea Appears to be a combination of new onset CHF on top of worsening renal failure.  Normally sees [facility], so a consult has been called to them as well as cardiology.  Will try Lasix  IV bid to see if we can improve her breathing.  Oxygen, would consider nitrates, but her BP is a bit low at this point and I don't know that it can support it right now. CKD Stage 3-4 with acute exacerbation:  In prior discussion, she is adamant about not undergoing long term dialysis, but might accept acute if needed.  I don't see an indication for it at this point.  Consideration is given to increasing volume to try to get her kidneys to respond, but the concern is that it will push her into volume overload at this point. Hyperkalemia:  Coupled with high glucose, likely will normalize or need to be replaced, will continue to monitor DM2 poor control:  Her Glucose was >400 in the office today and she has not been eating well, and likely not taking her insulin.  Will continue her on basal Levemir as well as a SSI.  Diabetic diet.  Will get diabetic teaching to help with her needs as well. Gout:  Reduce Allopurinol due to renal insufficiency, no acute concern. PPI for chronic GERD, converted to hospital formulary  protonix. Elevated Troponin:  ASA, will cycle enzymes, but likely renal related.  Clearly not in a position to undergo heart catheterization,  Austynn Pridmore W 12/15/2014, 4:22 PM

## 2014-12-15 NOTE — ED Notes (Signed)
Pt remains monitored by blood pressure, pulse ox, and 5 lead.  

## 2014-12-15 NOTE — ED Notes (Signed)
Pt. Sent to us for abnormal labs and admission. Dr. Wylene Simmerisovec is her Md  Pt. Is having sob denies any chest pain.  Pt. Labs were drawn today and her Glucose is 465, BUN 110 and CReatine 3.4, Pt. Is in NAD, Skin is pink , warm and dry.  Pt. Is alert and oriented  X4.

## 2014-12-16 DIAGNOSIS — N289 Disorder of kidney and ureter, unspecified: Secondary | ICD-10-CM

## 2014-12-16 DIAGNOSIS — I509 Heart failure, unspecified: Secondary | ICD-10-CM

## 2014-12-16 DIAGNOSIS — R7989 Other specified abnormal findings of blood chemistry: Secondary | ICD-10-CM

## 2014-12-16 DIAGNOSIS — R778 Other specified abnormalities of plasma proteins: Secondary | ICD-10-CM

## 2014-12-16 DIAGNOSIS — I5041 Acute combined systolic (congestive) and diastolic (congestive) heart failure: Secondary | ICD-10-CM

## 2014-12-16 LAB — CBC
HEMATOCRIT: 27.1 % — AB (ref 36.0–46.0)
Hemoglobin: 9 g/dL — ABNORMAL LOW (ref 12.0–15.0)
MCH: 31.4 pg (ref 26.0–34.0)
MCHC: 33.2 g/dL (ref 30.0–36.0)
MCV: 94.4 fL (ref 78.0–100.0)
Platelets: 378 10*3/uL (ref 150–400)
RBC: 2.87 MIL/uL — ABNORMAL LOW (ref 3.87–5.11)
RDW: 15.3 % (ref 11.5–15.5)
WBC: 11.3 10*3/uL — AB (ref 4.0–10.5)

## 2014-12-16 LAB — BASIC METABOLIC PANEL
Anion gap: 12 (ref 5–15)
BUN: 105 mg/dL — ABNORMAL HIGH (ref 6–23)
CO2: 28 mmol/L (ref 19–32)
CREATININE: 2.95 mg/dL — AB (ref 0.50–1.10)
Calcium: 9 mg/dL (ref 8.4–10.5)
Chloride: 92 mmol/L — ABNORMAL LOW (ref 96–112)
GFR calc non Af Amer: 14 mL/min — ABNORMAL LOW (ref >90)
GFR, EST AFRICAN AMERICAN: 16 mL/min — AB (ref >90)
GLUCOSE: 228 mg/dL — AB (ref 70–99)
Potassium: 4.6 mmol/L (ref 3.5–5.1)
SODIUM: 132 mmol/L — AB (ref 135–145)

## 2014-12-16 LAB — UREA NITROGEN, URINE: Urea Nitrogen, Ur: 534 mg/dL

## 2014-12-16 LAB — GLUCOSE, CAPILLARY
GLUCOSE-CAPILLARY: 82 mg/dL (ref 70–99)
Glucose-Capillary: 135 mg/dL — ABNORMAL HIGH (ref 70–99)
Glucose-Capillary: 95 mg/dL (ref 70–99)
Glucose-Capillary: 224 mg/dL — ABNORMAL HIGH (ref 70–99)

## 2014-12-16 LAB — TROPONIN I
TROPONIN I: 0.16 ng/mL — AB (ref <0.031)
Troponin I: 0.25 ng/mL — ABNORMAL HIGH (ref <0.031)

## 2014-12-16 MED ORDER — NEPRO/CARBSTEADY PO LIQD
237.0000 mL | Freq: Two times a day (BID) | ORAL | Status: DC
  Administered 2014-12-16 – 2014-12-17 (×2): 237 mL via ORAL

## 2014-12-16 MED ORDER — FUROSEMIDE 10 MG/ML IJ SOLN
80.0000 mg | Freq: Once | INTRAMUSCULAR | Status: AC
  Administered 2014-12-16: 80 mg via INTRAVENOUS
  Filled 2014-12-16: qty 8

## 2014-12-16 MED ORDER — ALBUTEROL SULFATE (2.5 MG/3ML) 0.083% IN NEBU
2.5000 mg | INHALATION_SOLUTION | RESPIRATORY_TRACT | Status: DC | PRN
  Administered 2014-12-16 (×2): 2.5 mg via RESPIRATORY_TRACT
  Filled 2014-12-16 (×2): qty 3

## 2014-12-16 MED ORDER — GUAIFENESIN 100 MG/5ML PO SYRP
200.0000 mg | ORAL_SOLUTION | ORAL | Status: DC | PRN
  Administered 2014-12-16: 200 mg via ORAL
  Filled 2014-12-16 (×2): qty 10

## 2014-12-16 MED ORDER — INSULIN DETEMIR 100 UNIT/ML ~~LOC~~ SOLN
36.0000 [IU] | Freq: Every day | SUBCUTANEOUS | Status: DC
  Administered 2014-12-16 – 2014-12-17 (×2): 36 [IU] via SUBCUTANEOUS
  Filled 2014-12-16 (×3): qty 0.36

## 2014-12-16 NOTE — Progress Notes (Signed)
Utilization review completed. Maylea Soria, RN, BSN. 

## 2014-12-16 NOTE — Evaluation (Addendum)
Physical Therapy Evaluation Patient Details Name: Donna Hurst MRN: [id number] DOB: 1932-07-15 Today's Date: 12/16/2014   History of Present Illness  Pt is an 81 y/o female who presents with a nonproductive cough, SOB, states she cant lay down at night due to SOB and chest congestion. Any exertion causes the dyspnea and she sweats a lot at night but doesnt know of any fever. Patient says symptoms have been going x 1 week PTA, and that she has been functionally declining for x1 month.    Clinical Impression  Pt admitted with above diagnosis. Pt currently with functional limitations due to the deficits listed below (see PT Problem List). At the time of PT eval pt was able to perform transfers with min assist for balance/support. Pt was not able to take steps with RW as she was very dyspneic from transfers. Pt on RA throughout session and sats decreased as low as 84% during transfer back to supine.   Pt will benefit from skilled PT to increase their independence and safety with mobility to allow discharge to the venue listed below.  Discussed PT recommendation for continued rehab at the SNF level (pt has been to SNF previously), however she is not agreeable at this time.      Follow Up Recommendations SNF;Supervision/Assistance - 24 hour    Equipment Recommendations  None recommended by PT    Recommendations for Other Services       Precautions / Restrictions Precautions Precautions: Fall Precaution Comments: Watch O2 sats Restrictions Weight Bearing Restrictions: No      Mobility  Bed Mobility Overal bed mobility: Needs Assistance Bed Mobility: Sit to Supine       Sit to supine: Min assist   General bed mobility comments: Assist to elevate LE's back into the bed. Pt having difficulty as the bed is high for her height.   Transfers Overall transfer level: Needs assistance Equipment used: 1 person hand held assist Transfers: Sit to/from UGI CorporationStand;Stand Pivot Transfers Sit to  Stand: Min guard Stand pivot transfers: Min assist       General transfer comment: Hands-on assist for balance during SPT recliner>BSC>recliner>bed. 3 transfers total.  Ambulation/Gait             General Gait Details: Further mobility deferred as pt very dyspneic and fatigued.  Stairs            Wheelchair Mobility    Modified Rankin (Stroke Patients Only)       Balance Overall balance assessment: Needs assistance Sitting-balance support: Feet supported;No upper extremity supported Sitting balance-Leahy Scale: Fair     Standing balance support: Single extremity supported;During functional activity Standing balance-Leahy Scale: Poor Standing balance comment: Pt requires UE support and/or assist from therapist for balance during dynamic activity.                              Pertinent Vitals/Pain Pain Assessment: 0-10 Pain Score: 7  Pain Location: BLE's Pain Intervention(s): Limited activity within patient's tolerance;Monitored during session;Repositioned    Home Living Family/patient expects to be discharged to:: Private residence Living Arrangements: Spouse/significant other;Children Available Help at Discharge: Family;Available 24 hours/day Type of Home: House Home Access: Stairs to enter Entrance Stairs-Rails: Right;Left;Can reach both Entrance Stairs-Number of Steps: 4 Home Layout: Multi-level;Able to live on main level with bedroom/bathroom Home Equipment: Dan HumphreysWalker - 2 wheels;Wheelchair - manual [employer](Premier tub - walk in and sit down to bathe)      Prior  Function Level of Independence: Independent with assistive device(s)         Comments: Pt was independent at baseline (1 month ago) however now requires some assistance to get in/out of the tub, shoes/socks. Son does the cooking for them and some of the grocery shopping.     Hand Dominance        Extremity/Trunk Assessment   Upper Extremity Assessment: Defer to OT evaluation            Lower Extremity Assessment: Generalized weakness      Cervical / Trunk Assessment: Normal  Communication   Communication: No difficulties  Cognition Arousal/Alertness: Awake/alert Behavior During Therapy: WFL for tasks assessed/performed Overall Cognitive Status: Within Functional Limits for tasks assessed                      General Comments      Exercises        Assessment/Plan    PT Assessment Patient needs continued PT services  PT Diagnosis Difficulty walking;Generalized weakness   PT Problem List Decreased strength;Decreased range of motion;Decreased activity tolerance;Decreased balance;Decreased knowledge of use of DME;Decreased safety awareness;Cardiopulmonary status limiting activity  PT Treatment Interventions DME instruction;Gait training;Stair training;Functional mobility training;Therapeutic activities;Therapeutic exercise;Neuromuscular re-education;Patient/family education   PT Goals (Current goals can be found in the Care Plan section) Acute Rehab PT Goals Patient Stated Goal: Be able to walk without getting SOB PT Goal Formulation: With patient Time For Goal Achievement: 12/30/14 Potential to Achieve Goals: Good    Frequency Min 3X/week   Barriers to discharge        Co-evaluation               End of Session Equipment Utilized During Treatment: Gait belt Activity Tolerance: Patient limited by fatigue Patient left: in bed;with call bell/phone within reach Nurse Communication: Mobility status         Time: 4098-1191 PT Time Calculation (min) (ACUTE ONLY): 28 min   Charges:   PT Evaluation $Initial PT Evaluation Tier I: 1 Procedure PT Treatments $Therapeutic Activity: 8-22 mins   PT G Codes:        Conni Slipper 01/06/2015, 9:00 AM  Conni Slipper, PT, DPT Acute Rehabilitation Services Pager: [phone]

## 2014-12-16 NOTE — Progress Notes (Signed)
INITIAL NUTRITION ASSESSMENT  DOCUMENTATION CODES Per approved criteria  -Morbid Obesity   INTERVENTION: Provide Nepro Shake po BID, each supplement provides 425 kcal and 19 grams protein.  Inadequate PO intake.  NUTRITION DIAGNOSIS: Inadequate oral intake related to decreased appetite as evidenced by meal completion of <25%.   Goal: Pt to meet >/= 90% of their estimated nutrition needs   Monitor:  PO intake, weight trends, labs, I/O's  Reason for Assessment: MST  81 y.o. female  Admitting Dx: ARF  ASSESSMENT: Pt with past medical history significant for chronic kidney disease stage IV that appears to be from underlying diabetes (with retinopathy/nephropathy) hypertension, congestive heart failure and DJD.  Pt reports having a decreased appetite which has been ongoing over the past 2 weeks. Husband reports pt would still eat 2 meals a snack a day, however on some days she would only eat 1 meal. Weight has been stable. Current meal completion is <25%. Pt is agreeable to Nepro Shake. RD to discontinue Ensure and order Nepro. Pt was encouraged to eat her food at meals and to drink her supplements. Pt educated to continue with supplements at home if appetite continues to be poor. RD was additionally consulted for a diabetic diet education. Education given  Pt with no observed significant fat or muscle mass loss.  Labs: Low sodium, chloride, and GFR. High BUN and creatinine.  Height: Ht Readings from Last 1 Encounters:  12/15/14 5' (1.524 m)    Weight: Wt Readings from Last 1 Encounters:  12/15/14 207 lb (93.895 kg)    Ideal Body Weight: 100 lbs  % Ideal Body Weight: 207%  Wt Readings from Last 10 Encounters:  12/15/14 207 lb (93.895 kg)    Usual Body Weight: 200  % Usual Body Weight: 104%  BMI:  Body mass index is 40.43 kg/(m^2). Morbid obesity  Estimated Nutritional Needs: Kcal: 1900-2050 Protein: 95-115 grams Fluid: Per MD  Skin: +2 LE edema  Diet  Order: Diet Carb Modified  EDUCATION NEEDS: -Education needs addressed   Intake/Output Summary (Last 24 hours) at 12/16/14 0957 Last data filed at 12/16/14 0459  Gross per 24 hour  Intake    483 ml  Output    950 ml  Net   -467 ml    Last BM: 3/7  Labs:   Recent Labs Lab 12/15/14 1431 12/15/14 2108 12/16/14 0313  NA 131*  --  132*  K 5.2*  --  4.6  CL 93*  --  92*  CO2  --   --  28  BUN 104*  --  105*  CREATININE 3.00* 3.03* 2.95*  CALCIUM  --   --  9.0  GLUCOSE 416*  --  228*    CBG (last 3)   Recent Labs  12/15/14 1737 12/15/14 2010 12/16/14 0756  GLUCAP 278* 234* 135*    Scheduled Meds: . allopurinol  100 mg Oral Daily  . aspirin  325 mg Oral Daily  . atenolol  100 mg Oral Daily  . atorvastatin  40 mg Oral q1800  . docusate sodium  100 mg Oral BID  . enoxaparin (LOVENOX) injection  30 mg Subcutaneous Q24H  . feeding supplement (ENSURE COMPLETE)  237 mL Oral BID BM  . furosemide  80 mg Intravenous BID  . insulin aspart  0-15 Units Subcutaneous TID WC  . insulin aspart  4 Units Subcutaneous TID WC  . insulin detemir  36 Units Subcutaneous QHS  . levothyroxine  25 mcg Oral QAC breakfast  .  mirtazapine  15 mg Oral QHS  . multivitamin with minerals  1 tablet Oral Daily  . pantoprazole  40 mg Oral Daily  . sodium chloride  3 mL Intravenous Q12H    Continuous Infusions: . sodium chloride Stopped (12/16/14 0407)    Past Medical History  Diagnosis Date  . Hypertension   . Diabetes mellitus with renal manifestation   . CKD (chronic kidney disease), stage IV   . Secondary hyperparathyroidism   . Anemia of chronic disease   . Obesity   . Dyslipidemia   . Hypothyroidism   . Chronic diastolic CHF (congestive heart failure)   . Gout   . Osteoarthritis   . Edema   . Systolic murmur     a. Noted in 2011; 2D Echo 01/2010: poor acoustic quality, normal wall thickness, EF 60-65%, mild-mod TR, PASP 40mmHg.    Past Surgical History  Procedure  Laterality Date  . Rotator cuff repair    . Hip surgery      For OA  . Back surgery      Marijean NiemannStephanie La, MS, RD, LDN Pager # [phone] After hours/ weekend pager # [phone]

## 2014-12-16 NOTE — Progress Notes (Signed)
Patient audibly wheezing.  Labored breathing at rest.  Respiratory rate 30.  O2 sats 93%-94% RA.  Patient unable to remain in bed and insisted on sitting up in recliner.  Tisovec MD notified of situation.  One time dose of 80 mg IV lasiv verbally ordered and administered.  Patient immediately urinated, and audible wheezing ceased.  Respiratory rate 20.  States she feels much better.  Will continue to monitor.

## 2014-12-16 NOTE — Progress Notes (Signed)
Inpatient Diabetes Program Recommendations  AACE/ADA: New Consensus Statement on Inpatient Glycemic Control (2013)  Target Ranges:  Prepandial:   less than 140 mg/dL      Peak postprandial:   less than 180 mg/dL (1-2 hours)      Critically ill patients:  140 - 180 mg/dL     Results for Donna Hurst, Donna Hurst (MRN [id number]) as of 12/16/2014 11:55  Ref. Range 12/15/2014 13:58 12/15/2014 17:37 12/15/2014 20:10  Glucose-Capillary Latest Range: 70-99 mg/dL 409415 (H) 811278 (H) 914234 (H)     Chief Complaint: SOB  History: DM, HTN, CHF, CKD 3-4  Home DM Meds: Levemir 34 units QHS        Novolog 0-24 units tidwc per SSI  Current DM Orders: Levemir 36 units QHS            Novolog Moderate SSI            Novolog 4 units tidwc    **Received referral for this patient "home compliance issues".  **Spoke with patient this AM about her home DM regimen.  Patient told me she has no problem taking her insulin, however, patient admitted to not checking her CBGs as she should.  Patient told me she is supposed to take Novolog per a sliding scale and often times she will guess at how much she needs to take instead of checking her CBG.  I encouraged patient to check her CBGs as ordered by Dr. Wylene Simmerisovec.  Reminded patient that without checking her glucose, she has no idea how much Novolog to take and that she may be underdosing or overdosing her Novolog insulin.  Patient stated she knows she needs to "do better" and stated she will try.  **Patient told me her insurance will only allow her to check her CBGs tid at home.  Encouraged patient to purchase another CBG meter OTC (any generic meter OTC at any local pharmacy) so that she can check more often.  Encouraged patient to keep a record of all CBGs and to take her CBG meters with her to all MD appointments with Dr. Wylene Simmerisovec.  Patient agreeable.  **Asked if patient had any questions about her home DM regimen, nutrition, etc.  Patient thanked me for the visit and stated she did not  have any further questions.   Will follow Ambrose FinlandJeannine Johnston Cambrea Kirt RN, MSN, CDE Diabetes Coordinator Inpatient Diabetes Program Team Pager: [phone] (8a-10p)

## 2014-12-16 NOTE — Progress Notes (Signed)
Patient complaining of bad cough and shortness of breath after using the bedside commode.  O2 94% RA.  Patient requesting something for cough.  Notified Tisovec, MD.  Verbal orders received for robitussin PRN.  Will continue to monitor.

## 2014-12-16 NOTE — Plan of Care (Signed)
Problem: Food- and Nutrition-Related Knowledge Deficit (NB-1.1) Goal: Nutrition education Formal process to instruct or train a patient/client in a skill or to impart knowledge to help patients/clients voluntarily manage or modify food choices and eating behavior to maintain or improve health. Outcome: Completed/Met Date Met:  12/16/14  RD consulted for nutrition education regarding diabetes.   RD provided "Carbohydrate Counting for People with Diabetes" handout from the Academy of [employer] and Dietetics. Discussed different food groups and their effects on blood sugar, emphasizing carbohydrate-containing foods. Provided list of carbohydrates and recommended serving sizes of common foods.  Discussed importance of controlled and consistent carbohydrate intake throughout the day. Recommended 4 servings of carbohydrates at each meal. Provided examples of ways to balance meals/snacks and encouraged intake of high-fiber, whole grain complex carbohydrates. Recommended nutritional supplementation (Nepro) at home is appetite continues to be low.Teach back method used.  Expect good compliance.  Kallie Locks, MS, RD, LDN Pager # [phone] After hours/ weekend pager # [phone]

## 2014-12-16 NOTE — Progress Notes (Signed)
Patient ID: Donna Hurst, female   DOB: [birth year], 81 y.o.   MRN: [id number]  [facility] Progress Note    Assessment/ Plan:   1. Acute renal failure on chronic kidney disease stage IV: This appears to be hemodynamically mediated with what appears to be CHF exacerbation. Fair response to diuretics overnight (urine output incompletely charted). No acute dialysis needs noted at this time, will continue to follow medical management. Low urine sodium points towards renal hypoperfusion state-CHF exacerbation 2. CHF exacerbation: Ongoing management of diuretics, continue to follow urine output/renal function. Await 2-D echocardiogram today. 3. Anemia of chronic kidney disease: Hemoglobin lower, await iron studies and restart ESA if an overt losses 4. Hyponatremia: This appears to be bi-factorial from pseudohyponatremia (hyperglycemia) and free water excretion defect of acute renal failure and CHF exacerbation. Monitor with diuresis. 5. Hyperkalemia: Secondary to hyperglycemia/AKI, improved with glycemic control/diuresis 6. Insulin-dependent diabetes mellitus: Management per primary service  Subjective:   Reports that she is still having some shortness of breath-slightly improved. Notes that not all of her urine output has been collected/charted.    Objective:   BP 140/105 mmHg  Pulse 56  Temp(Src) 98.1 F (36.7 C) (Oral)  Resp 20  Ht 5' (1.524 m)  Wt 93.895 kg (207 lb)  BMI 40.43 kg/m2  SpO2 93%  Intake/Output Summary (Last 24 hours) at 12/16/14 1101 Last data filed at 12/16/14 0459  Gross per 24 hour  Intake    483 ml  Output    950 ml  Net   -467 ml   Weight change:   Physical Exam: Gen: Appears to be comfortable resting in bed CVS: Pulse regular bradycardia Resp: Decreased breath sounds over bases Abd: Soft, obese, nontender Ext: 2+ lower extremity edema  Imaging: [facility]  12/15/2014   CLINICAL DATA:  Acute renal failure, acute on chronic  EXAM: RENAL/URINARY  TRACT ULTRASOUND COMPLETE  COMPARISON:  None.  FINDINGS: Right Kidney:  Length: 9 cm. Renal cortical thinning. Echogenicity within normal limits. No mass or hydronephrosis visualized.  Left Kidney:  Length: 10 cm. Renal cortical thinning. Echogenicity within normal limits. No mass or hydronephrosis visualized.  Bladder:  Appears normal for degree of bladder distention.  Other:  Incidental note made of gallstones.  IMPRESSION: 1. Bilateral renal cortical thinning consistent with chronic renal disease. 2. No obstructive uropathy. 3. Cholelithiasis.   Electronically Signed   By: Elige KoHetal  Brooklynn Brandenburg   On: 12/15/2014 17:43   Dg Chest Port 1 View  12/15/2014   CLINICAL DATA:  81 year old female with shortness of breath and lower extremity swelling. Initial encounter.  EXAM: PORTABLE CHEST - 1 VIEW  COMPARISON:  12/05/2010 and earlier.  FINDINGS: Portable AP semi upright view at 1432 hrs. Stable cardiac size allowing for portable technique. Other mediastinal contours are within normal limits. Calcified atherosclerosis of the aorta. No pneumothorax. Increased pulmonary vascularity. No definite effusion. Superimposed perihilar opacity favored to be atelectasis. No consolidation.  IMPRESSION: Increased pulmonary vascularity compatible with mild interstitial edema. Increased perihilar opacity, favor atelectasis.   Electronically Signed   By: Odessa FlemingH  Hall M.D.   On: 12/15/2014 14:48    Labs: BMET  Recent Labs Lab 12/15/14 1431 12/15/14 2108 12/16/14 0313  NA 131*  --  132*  K 5.2*  --  4.6  CL 93*  --  92*  CO2  --   --  28  GLUCOSE 416*  --  228*  BUN 104*  --  105*  CREATININE 3.00* 3.03* 2.95*  CALCIUM  --   --  9.0   CBC  Recent Labs Lab 12/15/14 1431 12/15/14 2108 12/16/14 0313  WBC  --  11.4* 11.3*  HGB 11.2* 9.2* 9.0*  HCT 33.0* 27.8* 27.1*  MCV  --  95.2 94.4  PLT  --  401* 378    Medications:    . allopurinol  100 mg Oral Daily  . aspirin  325 mg Oral Daily  . atenolol  100 mg Oral Daily  .  atorvastatin  40 mg Oral q1800  . docusate sodium  100 mg Oral BID  . enoxaparin (LOVENOX) injection  30 mg Subcutaneous Q24H  . feeding supplement (ENSURE COMPLETE)  237 mL Oral BID BM  . furosemide  80 mg Intravenous BID  . insulin aspart  0-15 Units Subcutaneous TID WC  . insulin aspart  4 Units Subcutaneous TID WC  . insulin detemir  36 Units Subcutaneous QHS  . levothyroxine  25 mcg Oral QAC breakfast  . mirtazapine  15 mg Oral QHS  . multivitamin with minerals  1 tablet Oral Daily  . pantoprazole  40 mg Oral Daily  . sodium chloride  3 mL Intravenous Q12H    Zetta Bills, MD 12/16/2014, 11:01 AM

## 2014-12-16 NOTE — Progress Notes (Signed)
Subjective: Had a rough night last night with SOB requiring an extra Lasix dose and cough.  Not hungry for breakfast.  Has a bottle of water next to her.  No family present this AM.  Objective: Vital signs in last 24 hours: Temp:  [97.5 F (36.4 C)-98.3 F (36.8 C)] 98.1 F (36.7 C) (03/09 0759) Pulse Rate:  [52-62] 56 (03/09 0759) Resp:  [15-28] 20 (03/09 0759) BP: (98-149)/(34-105) 140/105 mmHg (03/09 0759) SpO2:  [93 %-99 %] 93 % (03/09 0759) Weight:  [93.895 kg (207 lb)] 93.895 kg (207 lb) (03/08 2013) Weight change:  Last BM Date: 12/14/14  Intake/Output from previous day: 03/08 0701 - 03/09 0700 In: 483 [P.O.:480; I.V.:3] Out: 950 [Urine:950] Intake/Output this shift:    General appearance: alert, cooperative and appears stated age Nose: Nares normal. Septum midline. Mucosa normal. No drainage or sinus tenderness. Neck: no adenopathy, no carotid bruit, no JVD, supple, symmetrical, trachea midline and thyroid not enlarged, symmetric, no tenderness/mass/nodules Resp: rhonchi bibasilar Cardio: regular rate and rhythm, S1, S2 normal, no murmur, click, rub or gallop GI: soft, non-tender; bowel sounds normal; no masses,  no organomegaly Extremities: edema 1+ bilaterally Neurologic: Alert and oriented X 3, normal strength and tone. Normal symmetric reflexes. Normal coordination and gait  Lab Results:  Recent Labs  12/15/14 2108 12/16/14 0313  WBC 11.4* 11.3*  HGB 9.2* 9.0*  HCT 27.8* 27.1*  PLT 401* 378   BMET  Recent Labs  12/15/14 1431 12/15/14 2108 12/16/14 0313  NA 131*  --  132*  K 5.2*  --  4.6  CL 93*  --  92*  CO2  --   --  28  GLUCOSE 416*  --  228*  BUN 104*  --  105*  CREATININE 3.00* 3.03* 2.95*  CALCIUM  --   --  9.0    Studies/Results: [facility]  12/15/2014   CLINICAL DATA:  Acute renal failure, acute on chronic  EXAM: RENAL/URINARY TRACT ULTRASOUND COMPLETE  COMPARISON:  None.  FINDINGS: Right Kidney:  Length: 9 cm. Renal cortical thinning.  Echogenicity within normal limits. No mass or hydronephrosis visualized.  Left Kidney:  Length: 10 cm. Renal cortical thinning. Echogenicity within normal limits. No mass or hydronephrosis visualized.  Bladder:  Appears normal for degree of bladder distention.  Other:  Incidental note made of gallstones.  IMPRESSION: 1. Bilateral renal cortical thinning consistent with chronic renal disease. 2. No obstructive uropathy. 3. Cholelithiasis.   Electronically Signed   By: Elige KoHetal  Patel   On: 12/15/2014 17:43   Dg Chest Port 1 View  12/15/2014   CLINICAL DATA:  81 year old female with shortness of breath and lower extremity swelling. Initial encounter.  EXAM: PORTABLE CHEST - 1 VIEW  COMPARISON:  12/05/2010 and earlier.  FINDINGS: Portable AP semi upright view at 1432 hrs. Stable cardiac size allowing for portable technique. Other mediastinal contours are within normal limits. Calcified atherosclerosis of the aorta. No pneumothorax. Increased pulmonary vascularity. No definite effusion. Superimposed perihilar opacity favored to be atelectasis. No consolidation.  IMPRESSION: Increased pulmonary vascularity compatible with mild interstitial edema. Increased perihilar opacity, favor atelectasis.   Electronically Signed   By: Odessa FlemingH  Hall M.D.   On: 12/15/2014 14:48    Medications:  I have reviewed the patient's current medications. Scheduled: . allopurinol  100 mg Oral Daily  . aspirin  325 mg Oral Daily  . atenolol  100 mg Oral Daily  . atorvastatin  40 mg Oral q1800  . docusate sodium  100 mg Oral BID  . enoxaparin (LOVENOX) injection  30 mg Subcutaneous Q24H  . feeding supplement (ENSURE COMPLETE)  237 mL Oral BID BM  . furosemide  80 mg Intravenous BID  . insulin aspart  0-15 Units Subcutaneous TID WC  . insulin aspart  4 Units Subcutaneous TID WC  . insulin detemir  36 Units Subcutaneous QHS  . levothyroxine  25 mcg Oral QAC breakfast  . mirtazapine  15 mg Oral QHS  . multivitamin with minerals  1 tablet  Oral Daily  . pantoprazole  40 mg Oral Daily  . sodium chloride  3 mL Intravenous Q12H   Continuous: . sodium chloride Stopped (12/16/14 0407)   ZOX:[id number] **OR** acetaminophen, albuterol, guaifenesin, ondansetron **OR** ondansetron (ZOFRAN) IV, polyethylene glycol, zolpidem  Assessment/Plan: Active Problems:  Acute renal disease  Dyspnea Appears to be a combination of new onset CHF on top of worsening renal failure. Normally sees [facility], so a consult has been called to them as well as Cardiology, both saw at admission.On Lasix  IV bid to see if we can improve her breathing. Oxygen, BP is better today, received extra dose of Lasix at 4:30 AM today as well.  Echo pending for this AM. CKD Stage 3-4 with acute exacerbation: In prior discussion, she is adamant about not undergoing long term dialysis, but might accept acute if needed. I don't see an indication for it at this point. Consideration is given to increasing volume to try to get her kidneys to respond, but the concern is that it will push her into volume overload at this point.  Renal ultrasound showed medical renal disease, no mass or obstruction.  Voiding well with good response to Lasix.  Told to limit PO fluids as anything she takes in will have to come off. (regarding her water bottle at the bedside). Hyperkalemia: Coupled with high glucose, likely will normalize or need to be replaced, will continue to monitor DM2 poor control: Her Glucose was >400 in the office initially and she has not been eating well, and likely not taking her insulin. Will continue her on basal Levemir which I increased by 6 units today as well as a SSI. Diabetic diet. Will get diabetic teaching to help with her needs as well.  Will likely have to continue to adjust basal upward as renal indices improve. Gout: Reduced Allopurinol due to renal insufficiency, no acute concern. PPI for chronic GERD, converted to hospital formulary  protonix. Hypothyroid:  TSH level appropriate on current dose. Elevated Troponin: ASA, will cycle enzymes, but likely renal related/CHF. Clearly not in a position to undergo heart catheterization,  Echo pending today. Renally dosed Lovenox/TED hose for DVT prophylaxis.  Appreciate continued input of Cards/Renal consultants    LOS: 1 day   Sriman Tally W 12/16/2014, 8:08 AM

## 2014-12-16 NOTE — Progress Notes (Signed)
*  PRELIMINARY RESULTS* Echocardiogram 2D Echocardiogram has been performed.  Donna Hurst, Syndey Jaskolski 12/16/2014, 12:50 PM

## 2014-12-16 NOTE — Progress Notes (Signed)
Patient ID: Donna Hurst, female   DOB: [birth year], 81 y.o.   MRN: [id number]    Subjective:  Semi productive cough  Objective:  Filed Vitals:   12/15/14 2013 12/16/14 0219 12/16/14 0426 12/16/14 0759  BP: 138/44 149/35 131/53 140/105  Pulse: 62 61 57 56  Temp: 97.9 F (36.6 C)  98.3 F (36.8 C) 98.1 F (36.7 C)  TempSrc: Oral  Oral Oral  Resp: 22  28 20   Height: 5' (1.524 m)     Weight: 93.895 kg (207 lb)     SpO2: 96% 94% 95% 93%    Intake/Output from previous day:  Intake/Output Summary (Last 24 hours) at 12/16/14 1106 Last data filed at 12/16/14 0459  Gross per 24 hour  Intake    483 ml  Output    950 ml  Net   -467 ml    Physical Exam: Affect appropriate Obese white female  HEENT: normal Neck supple with no adenopathy JVP normal no bruits no thyromegaly Lungs clear with no wheezing and good diaphragmatic motion Heart:  S1/S2 SEM 2 component rub  murmur, no rub, gallop or click PMI normal Abdomen: benighn, BS positve, no tenderness, no AAA no bruit.  No HSM or HJR Distal pulses intact with no bruits Plus one bilateral edema  edema Neuro non-focal Skin warm and dry No muscular weakness   Lab Results: Basic Metabolic Panel:  Recent Labs  40/98/1102/05/24 1431 12/15/14 2108 12/16/14 0313  NA 131*  --  132*  K 5.2*  --  4.6  CL 93*  --  92*  CO2  --   --  28  GLUCOSE 416*  --  228*  BUN 104*  --  105*  CREATININE 3.00* 3.03* 2.95*  CALCIUM  --   --  9.0   CBC:  Recent Labs  12/15/14 2108 12/16/14 0313  WBC 11.4* 11.3*  HGB 9.2* 9.0*  HCT 27.8* 27.1*  MCV 95.2 94.4  PLT 401* 378   Cardiac Enzymes:  Recent Labs  12/15/14 2108 12/16/14 0313 12/16/14 0815  TROPONINI 0.21* 0.16* 0.25*   Thyroid Function Tests:  Recent Labs  12/15/14 2108  TSH 1.310    Imaging: [facility]  12/15/2014   CLINICAL DATA:  Acute renal failure, acute on chronic  EXAM: RENAL/URINARY TRACT ULTRASOUND COMPLETE  COMPARISON:  None.  FINDINGS: Right Kidney:   Length: 9 cm. Renal cortical thinning. Echogenicity within normal limits. No mass or hydronephrosis visualized.  Left Kidney:  Length: 10 cm. Renal cortical thinning. Echogenicity within normal limits. No mass or hydronephrosis visualized.  Bladder:  Appears normal for degree of bladder distention.  Other:  Incidental note made of gallstones.  IMPRESSION: 1. Bilateral renal cortical thinning consistent with chronic renal disease. 2. No obstructive uropathy. 3. Cholelithiasis.   Electronically Signed   By: Elige KoHetal  Patel   On: 12/15/2014 17:43   Dg Chest Port 1 View  12/15/2014   CLINICAL DATA:  81 year old female with shortness of breath and lower extremity swelling. Initial encounter.  EXAM: PORTABLE CHEST - 1 VIEW  COMPARISON:  12/05/2010 and earlier.  FINDINGS: Portable AP semi upright view at 1432 hrs. Stable cardiac size allowing for portable technique. Other mediastinal contours are within normal limits. Calcified atherosclerosis of the aorta. No pneumothorax. Increased pulmonary vascularity. No definite effusion. Superimposed perihilar opacity favored to be atelectasis. No consolidation.  IMPRESSION: Increased pulmonary vascularity compatible with mild interstitial edema. Increased perihilar opacity, favor atelectasis.   Electronically Signed   By: HRexene Edison  Margo Aye M.D.   On: 12/15/2014 14:48    Cardiac Studies:  ECG:  SR poor R wave progression no acute ST changes    Telemetry:  NSR no arrhythmia  Echo: pending   Medications:   . allopurinol  100 mg Oral Daily  . aspirin  325 mg Oral Daily  . atenolol  100 mg Oral Daily  . atorvastatin  40 mg Oral q1800  . docusate sodium  100 mg Oral BID  . enoxaparin (LOVENOX) injection  30 mg Subcutaneous Q24H  . feeding supplement (ENSURE COMPLETE)  237 mL Oral BID BM  . furosemide  80 mg Intravenous BID  . insulin aspart  0-15 Units Subcutaneous TID WC  . insulin aspart  4 Units Subcutaneous TID WC  . insulin detemir  36 Units Subcutaneous QHS  .  levothyroxine  25 mcg Oral QAC breakfast  . mirtazapine  15 mg Oral QHS  . multivitamin with minerals  1 tablet Oral Daily  . pantoprazole  40 mg Oral Daily  . sodium chloride  3 mL Intravenous Q12H     . sodium chloride Stopped (12/16/14 0407)    Assessment/Plan:  Elevated Troponin  No chest pain in setting of A/CRF not specific.  Echo pending  Certainly acute change in EF could cause worsening renal function With lower CO.  Not a cath candidate.  With no chest pain and inability to intervene would not likely order myovue either if EF normal by echo She does have signs of advance renal failure with profound anemia and rub.  Volume overload best Rx with lasix 80 iv bid and not hydaation Further recommendations based on result of echo  But her CRF and not wanting dialysis limits what cardiology can do diagnostically and Rx  Charlton Haws 12/16/2014, 11:06 AM

## 2014-12-16 NOTE — Plan of Care (Signed)
Problem: Phase II Progression Outcomes Goal: Dialysis initiated Outcome: Not Applicable Date Met:  78/71/83 Pt refusing HD

## 2014-12-17 ENCOUNTER — Inpatient Hospital Stay (HOSPITAL_COMMUNITY): Payer: Medicare Other

## 2014-12-17 DIAGNOSIS — I5033 Acute on chronic diastolic (congestive) heart failure: Secondary | ICD-10-CM

## 2014-12-17 DIAGNOSIS — R0600 Dyspnea, unspecified: Secondary | ICD-10-CM

## 2014-12-17 LAB — BASIC METABOLIC PANEL
ANION GAP: 8 (ref 5–15)
BUN: 92 mg/dL — ABNORMAL HIGH (ref 6–23)
CALCIUM: 9.3 mg/dL (ref 8.4–10.5)
CHLORIDE: 95 mmol/L — AB (ref 96–112)
CO2: 36 mmol/L — ABNORMAL HIGH (ref 19–32)
CREATININE: 2.3 mg/dL — AB (ref 0.50–1.10)
GFR calc Af Amer: 22 mL/min — ABNORMAL LOW (ref >90)
GFR calc non Af Amer: 19 mL/min — ABNORMAL LOW (ref >90)
GLUCOSE: 103 mg/dL — AB (ref 70–99)
POTASSIUM: 3.6 mmol/L (ref 3.5–5.1)
Sodium: 139 mmol/L (ref 135–145)

## 2014-12-17 LAB — CBC
HCT: 28 % — ABNORMAL LOW (ref 36.0–46.0)
HEMOGLOBIN: 9.1 g/dL — AB (ref 12.0–15.0)
MCH: 31.6 pg (ref 26.0–34.0)
MCHC: 32.5 g/dL (ref 30.0–36.0)
MCV: 97.2 fL (ref 78.0–100.0)
Platelets: 434 10*3/uL — ABNORMAL HIGH (ref 150–400)
RBC: 2.88 MIL/uL — ABNORMAL LOW (ref 3.87–5.11)
RDW: 15.7 % — AB (ref 11.5–15.5)
WBC: 14.2 10*3/uL — ABNORMAL HIGH (ref 4.0–10.5)

## 2014-12-17 LAB — GLUCOSE, CAPILLARY
Glucose-Capillary: 94 mg/dL (ref 70–99)
Glucose-Capillary: 210 mg/dL — ABNORMAL HIGH (ref 70–99)
Glucose-Capillary: 81 mg/dL (ref 70–99)
Glucose-Capillary: 106 mg/dL — ABNORMAL HIGH (ref 70–99)

## 2014-12-17 LAB — IRON AND TIBC
IRON: 17 ug/dL — AB (ref 42–145)
SATURATION RATIOS: 8 % — AB (ref 20–55)
TIBC: 209 ug/dL — AB (ref 250–470)
UIBC: 192 ug/dL (ref 125–400)

## 2014-12-17 LAB — VITAMIN B12: Vitamin B-12: 819 pg/mL (ref 211–911)

## 2014-12-17 LAB — FERRITIN: Ferritin: 987 ng/mL — ABNORMAL HIGH (ref 10–291)

## 2014-12-17 LAB — FOLATE: Folate: 18.6 ng/mL

## 2014-12-17 LAB — RETICULOCYTES
RBC.: 2.99 MIL/uL — ABNORMAL LOW (ref 3.87–5.11)
Retic Count, Absolute: 83.7 10*3/uL (ref 19.0–186.0)
Retic Ct Pct: 2.8 % (ref 0.4–3.1)

## 2014-12-17 MED ORDER — AMLODIPINE BESYLATE 2.5 MG PO TABS
2.5000 mg | ORAL_TABLET | Freq: Every day | ORAL | Status: DC
  Administered 2014-12-17 – 2014-12-18 (×2): 2.5 mg via ORAL
  Filled 2014-12-17 (×2): qty 1

## 2014-12-17 MED ORDER — DARBEPOETIN ALFA 60 MCG/0.3ML IJ SOSY
60.0000 ug | PREFILLED_SYRINGE | Freq: Once | INTRAMUSCULAR | Status: AC
  Administered 2014-12-17: 60 ug via SUBCUTANEOUS
  Filled 2014-12-17 (×2): qty 0.3

## 2014-12-17 MED ORDER — FUROSEMIDE 10 MG/ML IJ SOLN
40.0000 mg | Freq: Two times a day (BID) | INTRAMUSCULAR | Status: DC
  Administered 2014-12-17 – 2014-12-18 (×2): 40 mg via INTRAVENOUS
  Filled 2014-12-17 (×2): qty 4

## 2014-12-17 MED ORDER — FERROUS SULFATE 325 (65 FE) MG PO TABS
325.0000 mg | ORAL_TABLET | Freq: Two times a day (BID) | ORAL | Status: DC
  Administered 2014-12-18: 325 mg via ORAL
  Filled 2014-12-17 (×3): qty 1

## 2014-12-17 MED ORDER — FUROSEMIDE 80 MG PO TABS
80.0000 mg | ORAL_TABLET | Freq: Two times a day (BID) | ORAL | Status: DC
  Filled 2014-12-17 (×3): qty 1

## 2014-12-17 MED ORDER — FUROSEMIDE 40 MG PO TABS
40.0000 mg | ORAL_TABLET | Freq: Two times a day (BID) | ORAL | Status: DC
  Administered 2014-12-17: 40 mg via ORAL
  Filled 2014-12-17 (×3): qty 1

## 2014-12-17 NOTE — Progress Notes (Signed)
Pharmacy Miscellaneous Consult: Donna  [id number] with hx CKD IV and admitted with acute renal failure. Renal has been consulted and are seeing the patient. Donna has been ordered by the renal team this morning. Pharmacy will sign off and allow renal to manage Donna at this time.  Plan 1. Donna 60 mcg SQ x 1 dose ordered by Dr. Allena KatzPatel this AM 2. Pharmacy will sign off and let the renal team manage ESA/iron at this time.  Donna Hurst, PharmD, BCPS Clinical Pharmacist Pager: [phone] 12/17/2014 10:18 AM

## 2014-12-17 NOTE — Progress Notes (Signed)
Patient Name: Donna Hurst Date of Encounter: 12/17/2014  Primary Cardiologist: Dr. Antoine Poche saw her in consult in 2011   Active Problems:   Acute renal disease   Dyspnea   Elevated troponin   CHF (congestive heart failure)    SUBJECTIVE  SOB improving. Received breathing treatment for wheezing last night. No CP  CURRENT MEDS . allopurinol  100 mg Oral Daily  . amLODipine  2.5 mg Oral Daily  . aspirin  325 mg Oral Daily  . atenolol  100 mg Oral Daily  . atorvastatin  40 mg Oral q1800  . darbepoetin (ARANESP) injection - NON-DIALYSIS  60 mcg Subcutaneous Once  . docusate sodium  100 mg Oral BID  . enoxaparin (LOVENOX) injection  30 mg Subcutaneous Q24H  . feeding supplement (NEPRO CARB STEADY)  237 mL Oral BID BM  . furosemide  40 mg Oral BID  . insulin aspart  0-15 Units Subcutaneous TID WC  . insulin aspart  4 Units Subcutaneous TID WC  . insulin detemir  36 Units Subcutaneous QHS  . levothyroxine  25 mcg Oral QAC breakfast  . mirtazapine  15 mg Oral QHS  . multivitamin with minerals  1 tablet Oral Daily  . pantoprazole  40 mg Oral Daily  . sodium chloride  3 mL Intravenous Q12H    OBJECTIVE  Filed Vitals:   12/16/14 2118 12/16/14 2254 12/17/14 0453 12/17/14 0900  BP: 127/42  126/46 139/66  Pulse: 62  60 56  Temp: 98.4 F (36.9 C)  97.7 F (36.5 C) 98.7 F (37.1 C)  TempSrc: Oral  Oral Oral  Resp: 18  183 18  Height: 5' (1.524 m)     Weight: 207 lb 5.5 oz (94.05 kg)     SpO2: 94% 96% 95% 95%    Intake/Output Summary (Last 24 hours) at 12/17/14 1049 Last data filed at 12/17/14 0800  Gross per 24 hour  Intake    900 ml  Output   2850 ml  Net  -1950 ml   Filed Weights   12/15/14 2013 12/16/14 2118  Weight: 207 lb (93.895 kg) 207 lb 5.5 oz (94.05 kg)    PHYSICAL EXAM  General: Pleasant, NAD. Neuro: Alert and oriented X 3. Moves all extremities spontaneously. Psych: Normal affect. HEENT:  Normal  Neck: Supple without bruits or JVD. Lungs:  Resp  regular and unlabored. Decreased bibasilar breath sound with intermittent rale.  Heart: RRR no s3, s4. 2/6 systolic murmur Abdomen: Soft, non-tender, non-distended, BS + x 4.  Extremities: No clubbing, cyanosis. DP/PT/Radials 2+ and equal bilaterally. 1-2+ edema in LE  Accessory Clinical Findings  CBC  Recent Labs  12/16/14 0313 12/17/14 0721  WBC 11.3* 14.2*  HGB 9.0* 9.1*  HCT 27.1* 28.0*  MCV 94.4 97.2  PLT 378 434*   Basic Metabolic Panel  Recent Labs  12/16/14 0313 12/17/14 0721  NA 132* 139  K 4.6 3.6  CL 92* 95*  CO2 28 36*  GLUCOSE 228* 103*  BUN 105* 92*  CREATININE 2.95* 2.30*  CALCIUM 9.0 9.3   Cardiac Enzymes  Recent Labs  12/15/14 2108 12/16/14 0313 12/16/14 0815  TROPONINI 0.21* 0.16* 0.25*   Thyroid Function Tests  Recent Labs  12/15/14 2108  TSH 1.310    TELE NSR with HR 50-60s    ECG  No new EKG  Echocardiogram 12/16/2014  - Left ventricle: The cavity size was normal. Systolic function was normal. The estimated ejection fraction was in the range of 55%  to 60%. Wall motion was normal; there were no regional wall motion abnormalities. There was a reduced contribution of atrial contraction to ventricular filling, due to increased ventricular diastolic pressure or atrial contractile dysfunction. Doppler parameters are consistent with a reversible restrictive pattern, indicative of decreased left ventricular diastolic compliance and/or increased left atrial pressure (grade 3 diastolic dysfunction). - Aortic valve: Mild thickening and calcification, consistent with sclerosis. There was mild stenosis. Valve area (VTI): 1.31 cm^2. Valve area (Vmax): 1.31 cm^2. Valve area (Vmean): 1.34 cm^2. - Mitral valve: There was mild regurgitation. - Left atrium: The atrium was mildly dilated. - Pulmonary arteries: PA peak pressure: 43 mm Hg (S).  Impressions:  - The right ventricular systolic pressure was increased  consistent with moderate pulmonary hypertension.    Radiology/Studies  [facility]  12/15/2014   CLINICAL DATA:  Acute renal failure, acute on chronic  EXAM: RENAL/URINARY TRACT ULTRASOUND COMPLETE  COMPARISON:  None.  FINDINGS: Right Kidney:  Length: 9 cm. Renal cortical thinning. Echogenicity within normal limits. No mass or hydronephrosis visualized.  Left Kidney:  Length: 10 cm. Renal cortical thinning. Echogenicity within normal limits. No mass or hydronephrosis visualized.  Bladder:  Appears normal for degree of bladder distention.  Other:  Incidental note made of gallstones.  IMPRESSION: 1. Bilateral renal cortical thinning consistent with chronic renal disease. 2. No obstructive uropathy. 3. Cholelithiasis.   Electronically Signed   By: Elige KoHetal  Patel   On: 12/15/2014 17:43   Dg Chest Port 1 View  12/17/2014   CLINICAL DATA:  CHF.  Cough.  EXAM: PORTABLE CHEST - 1 VIEW  COMPARISON:  12/15/2014.  FINDINGS: Mediastinum and hilar structures normal. Stable cardiomegaly with mild pulmonary vascular prominence interstitial prominence consistent congestive heart failure. Small left pleural effusion. No pneumothorax. No acute bony abnormality.  IMPRESSION: Persistent cardiomegaly with mild pulmonary vascular prominence and interstitial prominence consistent with congestive heart failure with pulmonary interstitial edema. Small left pleural effusion cannot be excluded. No significant interval change.   Electronically Signed   By: Maisie Fushomas  Register   On: 12/17/2014 07:29   Dg Chest Port 1 View  12/15/2014   CLINICAL DATA:  81 year old female with shortness of breath and lower extremity swelling. Initial encounter.  EXAM: PORTABLE CHEST - 1 VIEW  COMPARISON:  12/05/2010 and earlier.  FINDINGS: Portable AP semi upright view at 1432 hrs. Stable cardiac size allowing for portable technique. Other mediastinal contours are within normal limits. Calcified atherosclerosis of the aorta. No pneumothorax. Increased  pulmonary vascularity. No definite effusion. Superimposed perihilar opacity favored to be atelectasis. No consolidation.  IMPRESSION: Increased pulmonary vascularity compatible with mild interstitial edema. Increased perihilar opacity, favor atelectasis.   Electronically Signed   By: Odessa FlemingH  Hall M.D.   On: 12/15/2014 14:48    ASSESSMENT AND PLAN  1. Acute on chronic diastolic heart failure  - Echo 12/16/2014 EF 55-60%, no RWMA, grade 3 diastolic dysfunction, PA peak pressure 43 mmHg  - likely would not benefit from further ischemic workup given normal EF on echo and patient's wish to avoid HD  - HF and renal function improving with IV diuresis. Still fluid overloaded. Per nephrology, transition to 40mg  BID IV lasix and eventually discharge on 80mg  BID of PO lasix. However per medication list, currently on 40mg  BID PO lasix, was previously on 80mg  in AM/40mg  in PM PO lasix at home. Likely medication placed in error, will change to 40mg  BID IV lasix and monitor renal output  2. Acute on chronic renal insufficiency  3. Mildly elevated trop  - not a candidate for invasive workup given renal condition. Patient adamently does not want HD  4. HTN 5. HLD 6. Anemia of chronic disease  Signed, Amedeo Plenty Pager: [phone]  Slow improvement Cr better Echo with elevated EDP normal EF  Would d/c in am on 80 bid oral lasix  Lungs improved as is LE edema  SEM from AV sclerosis Ok to d/c in am  [employer]

## 2014-12-17 NOTE — Progress Notes (Signed)
Patient ID: Donna Hurst, female   DOB: Feb 20, 1932, 81 y.o.   MRN: [id number]  [facility] Progress Note    Assessment/ Plan:   1. Acute renal failure on chronic kidney disease stage IV: This appears to be hemodynamically mediated with what appears to be CHF exacerbation. Continues to have good response to diuretics overnight now with improvement of renal function-appears it may be getting her back to her starling curve. No dialysis needs noted, no acute electrolyte concerns, no uremic symptoms. We'll decrease furosemide to 40 mg intravenous twice a day to allow transition to 80 mg by mouth twice a day upon discharge 2. CHF exacerbation: Ongoing management of diuretics, continue to follow urine output/renal function. Seen by cardiology-. 3. Anemia of chronic kidney disease: Hemoglobin lower, restart ESA, check iron stores. 4. Hyponatremia: Improving with better renal function/glycemic control. 5. Hyperkalemia: Improved/corrected with diuresis/urine output 6. Insulin-dependent diabetes mellitus: Management per primary service  Subjective:   Reports to be feeling fair-shortness of breath still persists. Understands that improvement will be a slow process.    Objective:   BP 126/46 mmHg  Pulse 60  Temp(Src) 97.7 F (36.5 C) (Oral)  Resp 183  Ht 5' (1.524 m)  Wt 94.05 kg (207 lb 5.5 oz)  BMI 40.49 kg/m2  SpO2 95%  Intake/Output Summary (Last 24 hours) at 12/17/14 0913 Last data filed at 12/17/14 0800  Gross per 24 hour  Intake    900 ml  Output   2850 ml  Net  -1950 ml   Weight change: 0.155 kg (5.5 oz)  Physical Exam: Gen: Comfortably sitting up in her chair CVS: Pulse regular in rate and rhythm Resp: Decreased breath sounds over bases Abd: Soft, obese, nontender Ext: 2+ lower extremity edema  Imaging: [place] [facility]al  12/15/2014   CLINICAL DATA:  Acute renal failure, acute on chronic  EXAM: RENAL/URINARY TRACT ULTRASOUND COMPLETE  COMPARISON:  None.  FINDINGS: Right  Kidney:  Length: 9 cm. Renal cortical thinning. Echogenicity within normal limits. No mass or hydronephrosis visualized.  Left Kidney:  Length: 10 cm. Renal cortical thinning. Echogenicity within normal limits. No mass or hydronephrosis visualized.  Bladder:  Appears normal for degree of bladder distention.  Other:  Incidental note made of gallstones.  IMPRESSION: 1. Bilateral renal cortical thinning consistent with chronic renal disease. 2. No obstructive uropathy. 3. Cholelithiasis.   Electronically Signed   By: Elige KoHetal  Ridwan Bondy   On: 12/15/2014 17:43   Dg Chest Port 1 View  12/17/2014   CLINICAL DATA:  CHF.  Cough.  EXAM: PORTABLE CHEST - 1 VIEW  COMPARISON:  12/15/2014.  FINDINGS: Mediastinum and hilar structures normal. Stable cardiomegaly with mild pulmonary vascular prominence interstitial prominence consistent congestive heart failure. Small left pleural effusion. No pneumothorax. No acute bony abnormality.  IMPRESSION: Persistent cardiomegaly with mild pulmonary vascular prominence and interstitial prominence consistent with congestive heart failure with pulmonary interstitial edema. Small left pleural effusion cannot be excluded. No significant interval change.   Electronically Signed   By: Maisie Fushomas  Register   On: 12/17/2014 07:29   Dg Chest Port 1 View  12/15/2014   CLINICAL DATA:  81 year old female with shortness of breath and lower extremity swelling. Initial encounter.  EXAM: PORTABLE CHEST - 1 VIEW  COMPARISON:  12/05/2010 and earlier.  FINDINGS: Portable AP semi upright view at 1432 hrs. Stable cardiac size allowing for portable technique. Other mediastinal contours are within normal limits. Calcified atherosclerosis of the aorta. No pneumothorax. Increased pulmonary vascularity. No definite  effusion. Superimposed perihilar opacity favored to be atelectasis. No consolidation.  IMPRESSION: Increased pulmonary vascularity compatible with mild interstitial edema. Increased perihilar opacity, favor  atelectasis.   Electronically Signed   By: Odessa Fleming M.D.   On: 12/15/2014 14:48    Labs: BMET  Recent Labs Lab 12/15/14 1431 12/15/14 2108 12/16/14 0313 12/17/14 0721  NA 131*  --  132* 139  K 5.2*  --  4.6 3.6  CL 93*  --  92* 95*  CO2  --   --  28 36*  GLUCOSE 416*  --  228* 103*  BUN 104*  --  105* 92*  CREATININE 3.00* 3.03* 2.95* 2.30*  CALCIUM  --   --  9.0 9.3   CBC  Recent Labs Lab 12/15/14 1431 12/15/14 2108 12/16/14 0313 12/17/14 0721  WBC  --  11.4* 11.3* 14.2*  HGB 11.2* 9.2* 9.0* 9.1*  HCT 33.0* 27.8* 27.1* 28.0*  MCV  --  95.2 94.4 97.2  PLT  --  401* 378 434*    Medications:    . allopurinol  100 mg Oral Daily  . amLODipine  2.5 mg Oral Daily  . aspirin  325 mg Oral Daily  . atenolol  100 mg Oral Daily  . atorvastatin  40 mg Oral q1800  . docusate sodium  100 mg Oral BID  . enoxaparin (LOVENOX) injection  30 mg Subcutaneous Q24H  . feeding supplement (NEPRO CARB STEADY)  237 mL Oral BID BM  . furosemide  80 mg Oral BID  . insulin aspart  0-15 Units Subcutaneous TID WC  . insulin aspart  4 Units Subcutaneous TID WC  . insulin detemir  36 Units Subcutaneous QHS  . levothyroxine  25 mcg Oral QAC breakfast  . mirtazapine  15 mg Oral QHS  . multivitamin with minerals  1 tablet Oral Daily  . pantoprazole  40 mg Oral Daily  . sodium chloride  3 mL Intravenous Q12H    Zetta Bills, MD 12/17/2014, 9:13 AM

## 2014-12-17 NOTE — Progress Notes (Signed)
Subjective: DId better last night.  Wants to go home (she really didn't want to come here to begin with even when seen in the office).   Reviewed her Echo with her this AM as well.  Objective: Vital signs in last 24 hours: Temp:  [97.7 F (36.5 C)-98.4 F (36.9 C)] 97.7 F (36.5 C) (03/10 0453) Pulse Rate:  [60-62] 60 (03/10 0453) Resp:  [18-183] 183 (03/10 0453) BP: (126-137)/(42-74) 126/46 mmHg (03/10 0453) SpO2:  [94 %-98 %] 95 % (03/10 0453) Weight:  [94.05 kg (207 lb 5.5 oz)] 94.05 kg (207 lb 5.5 oz) (03/09 2118) Weight change: 0.155 kg (5.5 oz) Last BM Date: 12/14/14  Intake/Output from previous day: 03/09 0701 - 03/10 0700 In: 780 [P.O.:780] Out: 1900 [Urine:1900] Intake/Output this shift:   General appearance: alert, cooperative and appears stated age Nose: Nares normal. Septum midline. Mucosa normal. No drainage or sinus tenderness. Neck: no adenopathy, no carotid bruit, no JVD, supple, symmetrical, trachea midline and thyroid not enlarged, symmetric, no tenderness/mass/nodules Resp: rhonchi bibasilar improved, slight today Cardio: regular rate and rhythm, S1, S2 normal, no murmur, click, rub or gallop GI: soft, non-tender; bowel sounds normal; no masses, no organomegaly Extremities: edema 1+ bilaterally Neurologic: Alert and oriented X 3, normal strength and tone. Normal symmetric reflexes. Normal coordination and gait   Lab Results:  Recent Labs  12/15/14 2108 12/16/14 0313  WBC 11.4* 11.3*  HGB 9.2* 9.0*  HCT 27.8* 27.1*  PLT 401* 378   BMET  Recent Labs  12/15/14 1431 12/15/14 2108 12/16/14 0313  NA 131*  --  132*  K 5.2*  --  4.6  CL 93*  --  92*  CO2  --   --  28  GLUCOSE 416*  --  228*  BUN 104*  --  105*  CREATININE 3.00* 3.03* 2.95*  CALCIUM  --   --  9.0    Studies/Results: US Renal  12/15/2014   CLINICAL DATA:  Acute renal failure, acute on chronic  EXAM: RENAL/URINARY TRACT ULTRASOUND COMPLETE  COMPARISON:  None.  FINDINGS: Right  Kidney:  Length: 9 cm. Renal cortical thinning. Echogenicity within normal limits. No mass or hydronephrosis visualized.  Left Kidney:  Length: 10 cm. Renal cortical thinning. Echogenicity within normal limits. No mass or hydronephrosis visualized.  Bladder:  Appears normal for degree of bladder distention.  Other:  Incidental note made of gallstones.  IMPRESSION: 1. Bilateral renal cortical thinning consistent with chronic renal disease. 2. No obstructive uropathy. 3. Cholelithiasis.   Electronically Signed   By: Elige Ko   On: 12/15/2014 17:43   Dg Chest Port 1 View  12/17/2014   CLINICAL DATA:  CHF.  Cough.  EXAM: PORTABLE CHEST - 1 VIEW  COMPARISON:  12/15/2014.  FINDINGS: Mediastinum and hilar structures normal. Stable cardiomegaly with mild pulmonary vascular prominence interstitial prominence consistent congestive heart failure. Small left pleural effusion. No pneumothorax. No acute bony abnormality.  IMPRESSION: Persistent cardiomegaly with mild pulmonary vascular prominence and interstitial prominence consistent with congestive heart failure with pulmonary interstitial edema. Small left pleural effusion cannot be excluded. No significant interval change.   Electronically Signed   By: Maisie Fus  Register   On: 12/17/2014 07:29   Dg Chest Port 1 View  12/15/2014   CLINICAL DATA:  81 year old female with shortness of breath and lower extremity swelling. Initial encounter.  EXAM: PORTABLE CHEST - 1 VIEW  COMPARISON:  12/05/2010 and earlier.  FINDINGS: Portable AP semi upright view at 1432 hrs. Stable cardiac  size allowing for portable technique. Other mediastinal contours are within normal limits. Calcified atherosclerosis of the aorta. No pneumothorax. Increased pulmonary vascularity. No definite effusion. Superimposed perihilar opacity favored to be atelectasis. No consolidation.  IMPRESSION: Increased pulmonary vascularity compatible with mild interstitial edema. Increased perihilar opacity, favor  atelectasis.   Electronically Signed   By: Odessa FlemingH  Hall M.D.   On: 12/15/2014 14:48    Medications:  I have reviewed the patient's current medications. Scheduled: . allopurinol  100 mg Oral Daily  . aspirin  325 mg Oral Daily  . atenolol  100 mg Oral Daily  . atorvastatin  40 mg Oral q1800  . docusate sodium  100 mg Oral BID  . enoxaparin (LOVENOX) injection  30 mg Subcutaneous Q24H  . feeding supplement (NEPRO CARB STEADY)  237 mL Oral BID BM  . furosemide  80 mg Intravenous BID  . insulin aspart  0-15 Units Subcutaneous TID WC  . insulin aspart  4 Units Subcutaneous TID WC  . insulin detemir  36 Units Subcutaneous QHS  . levothyroxine  25 mcg Oral QAC breakfast  . mirtazapine  15 mg Oral QHS  . multivitamin with minerals  1 tablet Oral Daily  . pantoprazole  40 mg Oral Daily  . sodium chloride  3 mL Intravenous Q12H   Continuous: . sodium chloride Stopped (12/16/14 0407)   ZOX:[id number]:acetaminophen **OR** acetaminophen, albuterol, guaifenesin, ondansetron **OR** ondansetron (ZOFRAN) IV, polyethylene glycol, zolpidem  Assessment/Plan: Active Problems:  Acute renal disease  Dyspnea Appears to be a combination of new onset CHF on top of worsening renal failure. Normally sees [facility], so a consult has been called to them as well as Cardiology, both saw at admission.On Lasix 80mg  IV bid to see if we can improve her breathing. Oxygen, BP is better today again. Echo shows grade 3 diastolic dysfunction with moderate pulm HTN.  WIll add low dose Norvasc and convert Lasix to PO and see if her BP will tolerate this combination. CKD Stage 3-4 with acute exacerbation: In prior discussion, she is adamant about not undergoing long term dialysis, but might accept acute if needed. I don't see an indication for it at this point. Consideration is given to increasing volume to try to get her kidneys to respond, but the concern is that it will push her into volume overload at this point. Renal  ultrasound showed medical renal disease, no mass or obstruction. Voiding well with good response to Lasix. Told to limit PO fluids as anything she takes in will have to come off. (regarding her water bottle at the bedside).  Labs still pending for this AM (drawn late) Hyperkalemia: Coupled with high glucose, likely will normalize or need to be replaced, will continue to monitor DM2 poor control: Her Glucose was >400 in the office initially and she has not been eating well, and likely not taking her insulin. Will continue her on basal Levemir which I increased by 6 unitsyesterday as well as a SSI. Diabetic diet. Will get diabetic teaching to help with her needs as well. Will likely have to continue to adjust basal upward as renal indices improve. Gout: Reduced Allopurinol due to renal insufficiency, no acute concern. PPI for chronic GERD, converted to hospital formulary protonix. Hypothyroid: TSH level appropriate on current dose. Elevated Troponin: ASA, will cycle enzymes, but likely renal related/CHF. Clearly not in a position to undergo heart catheterization, Echo pending today. Renally dosed Lovenox/TED hose for DVT prophylaxis.  Appreciate continued input of Cards/Renal consultants IF she tolerates  the added Norvasc and conversion to PO Lasix, I intend to send her home tomorrow with close outpatient f/u.  She wants to go home today but I noted to her that it is still premature.  LOS: 2 days   TISOVEC,RICHARD W 12/17/2014, 8:33 AM

## 2014-12-18 LAB — CBC
HCT: 27.6 % — ABNORMAL LOW (ref 36.0–46.0)
HEMOGLOBIN: 8.9 g/dL — AB (ref 12.0–15.0)
MCH: 31.2 pg (ref 26.0–34.0)
MCHC: 32.2 g/dL (ref 30.0–36.0)
MCV: 96.8 fL (ref 78.0–100.0)
PLATELETS: 449 10*3/uL — AB (ref 150–400)
RBC: 2.85 MIL/uL — ABNORMAL LOW (ref 3.87–5.11)
RDW: 15.9 % — AB (ref 11.5–15.5)
WBC: 13.7 10*3/uL — ABNORMAL HIGH (ref 4.0–10.5)

## 2014-12-18 LAB — GLUCOSE, CAPILLARY
Glucose-Capillary: 106 mg/dL — ABNORMAL HIGH (ref 70–99)
Glucose-Capillary: 39 mg/dL — CL (ref 70–99)
Glucose-Capillary: 81 mg/dL (ref 70–99)
Glucose-Capillary: 161 mg/dL — ABNORMAL HIGH (ref 70–99)

## 2014-12-18 LAB — BASIC METABOLIC PANEL
Anion gap: 11 (ref 5–15)
BUN: 81 mg/dL — AB (ref 6–23)
CHLORIDE: 99 mmol/L (ref 96–112)
CO2: 34 mmol/L — AB (ref 19–32)
CREATININE: 2.25 mg/dL — AB (ref 0.50–1.10)
Calcium: 9.2 mg/dL (ref 8.4–10.5)
GFR calc Af Amer: 22 mL/min — ABNORMAL LOW (ref >90)
GFR calc non Af Amer: 19 mL/min — ABNORMAL LOW (ref >90)
GLUCOSE: 58 mg/dL — AB (ref 70–99)
Potassium: 3.5 mmol/L (ref 3.5–5.1)
Sodium: 144 mmol/L (ref 135–145)

## 2014-12-18 MED ORDER — FUROSEMIDE 40 MG PO TABS: 80.0000 mg | ORAL_TABLET | Freq: Two times a day (BID) | ORAL | Status: AC

## 2014-12-18 MED ORDER — FERROUS SULFATE 325 (65 FE) MG PO TABS: 325.0000 mg | ORAL_TABLET | Freq: Two times a day (BID) | ORAL | Status: DC

## 2014-12-18 MED ORDER — DEXTROSE 50 % IV SOLN
INTRAVENOUS | Status: AC
  Filled 2014-12-18: qty 50

## 2014-12-18 MED ORDER — MIRTAZAPINE 15 MG PO TABS: 15.0000 mg | ORAL_TABLET | Freq: Every day | ORAL | Status: AC

## 2014-12-18 MED ORDER — SODIUM CHLORIDE 0.9 % IV SOLN
510.0000 mg | Freq: Once | INTRAVENOUS | Status: AC
  Administered 2014-12-18: 510 mg via INTRAVENOUS
  Filled 2014-12-18: qty 17

## 2014-12-18 NOTE — Patient Instructions (Signed)
Mini Squat: Double Leg   With feet shoulder width apart, reach forward for balance and do a mini squat. Keep knees in line with second toe. Knees do not go past toes. Repeat ___ times per set. Rest ___ seconds after set. Do ___ sets per session.  http://plyo.exer.us/70   Copyright  VHI. All rights reserved.  KNEE: Extension, Long Arc Quad (Weight)   Place weight around leg. Raise leg until knee is straight. Hold ___ seconds. Use ___ lb weight. ___ reps per set, ___ sets per day, ___ days per week  Copyright  VHI. All rights reserved.  Strengthening: Hip Adduction - Isometric   With ball or folded pillow between knees, squeeze knees together. Hold ____ seconds. Repeat ____ times per set. Do ____ sets per session. Do ____ sessions per day.  http://orth.exer.us/613   Copyright  VHI. All rights reserved.  ABDUCTION: Supine (Active)   Lie on back, legs straight. Draw right leg out to the side as far as possible. Use ___ lbs. Complete ___ sets of ___ repetitions. Perform ___ sessions per day.  Copyright  VHI. All rights reserved.  Hip (Front)   Begin sitting tall, both feet flat on floor. Inhale, then exhale while lifting knee as high as is comfortable, keeping upper body straight and still. Slowly return to starting position. Repeat ____ times each leg. Do ____ sets per session. Do ____ sessions per day. Do with ____ pound ankle weights.  Copyright  VHI. All rights reserved.  Quad Set   Slowly tighten muscles on thigh of straight leg while counting out loud to ____. Repeat with other leg. Repeat ____ times. Do ____ sessions per day.  http://gt2.exer.us/362   Copyright  VHI. All rights reserved.

## 2014-12-18 NOTE — Progress Notes (Signed)
Hypoglycemic Event  CBG: 39  Treatment: D50 IV 25 mL  Symptoms: Sweaty  Follow-up CBG: Time:07:11 CBG Result:106  Possible Reasons for Event: Unknown  Comments/MD notified: Patient given milk and crackers as well to take before breakfast.     Leim FabryBirago, Danyal Whitenack  Remember to initiate Hypoglycemia Order Set & complete

## 2014-12-18 NOTE — Progress Notes (Signed)
Patient Discharge:  Disposition: Pt discharged home with husband  Education: Pt educated on medications, follow up appointments, and all discharge instructions. Pt verbalized understanding.   IV: Removed  Telemetry: Removed CCMD notified  Follow-up appointments:Reviewed with pt  Prescriptions: Sent to pt's pharmacy  Transportation: Transported home by husband  Belongings: All belongings taken with pt.

## 2014-12-18 NOTE — Progress Notes (Signed)
Physical Therapy Treatment Patient Details Name: Donna Hurst MRN: [id number] DOB: Feb 25, 1932 Today's Date: 12/18/2014    History of Present Illness Pt is an 81 y/o female who presents with a nonproductive cough, SOB, states she cant lay down at night due to SOB and chest congestion. Any exertion causes the dyspnea and she sweats a lot at night but doesnt know of any fever. Patient says symptoms have been going x 1 week PTA, and that she has been functionally declining for x1 month.      PT Comments    Pt progressing towards physical therapy goals. Was able to tolerate increased activity this session with no noted dyspnea during gait training. Pt was able to perform therapeutic exercise without complaints of increased pain or fatigue. Will continue to follow and progress as able per POC.   Follow Up Recommendations  SNF;Supervision/Assistance - 24 hour     Equipment Recommendations  None recommended by PT    Recommendations for Other Services       Precautions / Restrictions Precautions Precautions: Fall Precaution Comments: Watch O2 sats Restrictions Weight Bearing Restrictions: No    Mobility  Bed Mobility               General bed mobility comments: Pt sitting up in the recliner upon PT arrival.   Transfers Overall transfer level: Needs assistance Equipment used: Rolling walker (2 wheeled) Transfers: Sit to/from Stand Sit to Stand: Min guard         General transfer comment: Pt was able to power-up to full standing without assistance. No unsteadiness noted.  Ambulation/Gait Ambulation/Gait assistance: Min guard Ambulation Distance (Feet): 75 Feet Assistive device: Rolling walker (2 wheeled) Gait Pattern/deviations: Step-through pattern;Decreased stride length;Trunk flexed Gait velocity: Decreased Gait velocity interpretation: Below normal speed for age/gender General Gait Details: Pt was able to ambulate well with close guard for safety. No unsteadiness  noted with RW use.   Stairs Stairs:  (Pt declined practicing the stairs.)          Wheelchair Mobility    Modified Rankin (Stroke Patients Only)       Balance Overall balance assessment: Needs assistance Sitting-balance support: Feet supported;No upper extremity supported Sitting balance-Leahy Scale: Fair     Standing balance support: Bilateral upper extremity supported;During functional activity Standing balance-Leahy Scale: Poor Standing balance comment: Pt requires UE support for dynamic balance.                     Cognition Arousal/Alertness: Awake/alert Behavior During Therapy: WFL for tasks assessed/performed Overall Cognitive Status: Within Functional Limits for tasks assessed                      Exercises General Exercises - Lower Extremity Quad Sets: 15 reps Gluteal Sets: 15 reps Long Arc Quad: 15 reps Hip ABduction/ADduction: 15 reps    General Comments General comments (skin integrity, edema, etc.): HEP issued      Pertinent Vitals/Pain Pain Assessment: No/denies pain    Home Living                      Prior Function            PT Goals (current goals can now be found in the care plan section) Acute Rehab PT Goals Patient Stated Goal: Be able to walk without getting SOB PT Goal Formulation: With patient Time For Goal Achievement: 12/30/14 Potential to Achieve Goals: Good Progress towards PT goals:  Progressing toward goals    Frequency  Min 3X/week    PT Plan Current plan remains appropriate    Co-evaluation             End of Session Equipment Utilized During Treatment: Gait belt Activity Tolerance: Patient limited by fatigue Patient left: in bed;with call bell/phone within reach     Time: 0850-0915 PT Time Calculation (min) (ACUTE ONLY): 25 min  Charges:  $Gait Training: 8-22 mins $Therapeutic Exercise: 8-22 mins                    G Codes:      Donna Hurst, Donna Hurst 12/18/2014, 12:48 PM    Donna Hurst, PT, DPT Acute Rehabilitation Services Pager: [phone]

## 2014-12-18 NOTE — Care Management Note (Signed)
CARE MANAGEMENT NOTE 12/18/2014  Patient:  Donna Hurst, Donna Hurst   Account Number:  [id number]  Date Initiated:  12/18/2014  Documentation initiated by:  Alexxia Stankiewicz  Subjective/Objective Assessment:   CM following for progression and d/c planning.     Action/Plan:   Met with pt no d/c needs identified.   Anticipated DC Date:  12/18/2014   Anticipated DC Plan:  HOME/SELF CARE         Choice offered to / List presented to:             Status of service:  Completed, signed off Medicare Important Message given?  YES (If response is "NO", the following Medicare IM given date fields will be blank) Date Medicare IM given:  12/18/2014 Medicare IM given by:  Brandell Maready Date Additional Medicare IM given:   Additional Medicare IM given by:    Discharge Disposition:  HOME/SELF CARE  Per UR Regulation:    If discussed at Long Length of Stay Meetings, dates discussed:    Comments:

## 2014-12-18 NOTE — Discharge Summary (Signed)
DISCHARGE SUMMARY  Donna Hurst  MR#: [id number]  DOB:08/29/1932  Date of Admission: 12/15/2014 Date of Discharge: 12/18/2014  Attending Physician:Yani Lal W  Patient's [name], Donna Hurst  Consults:Treatment Team:  Zetta Bills, MD [facility], MD  Discharge Diagnoses: Principal Problem:   Acute on chronic diastolic heart failure, Grade 3 on Echo Active Problems:   Acute renal disease   Pulmonary HTN, moderate   Dyspnea   Elevated troponin   Gout Diabetes mellitus type 2 with renal complications  Chronic Kidney Disease Stage 4  Anemia, iron deficiency and anemia of chronic disease Chronic depression, recurrent, mild to moderate GERD HTN Hyperlipidemia Hypothyroidism Proteinuria Constipation  Discharge Medications:   Medication List    TAKE these medications        allopurinol 300 MG tablet  Commonly known as:  ZYLOPRIM  Take 300 mg by mouth daily.     amLODipine 5 MG tablet  Commonly known as:  NORVASC  Take 5 mg by mouth daily.     aspirin EC 81 MG tablet  Take 81 mg by mouth daily.     atenolol 100 MG tablet  Commonly known as:  TENORMIN  Take 100 mg by mouth daily.     calcium carbonate 600 MG Tabs tablet  Commonly known as:  OS-CAL  Take 600 mg by mouth 2 (two) times daily with a meal.     docusate sodium 100 MG capsule  Commonly known as:  COLACE  Take 100 mg by mouth 2 (two) times daily.     ferrous sulfate 325 (65 FE) MG tablet  Take 1 tablet (325 mg total) by mouth 2 (two) times daily with a meal.     furosemide 40 MG tablet  Commonly known as:  LASIX  Take 2 tablets (80 mg total) by mouth 2 (two) times daily.     insulin aspart 100 UNIT/ML injection  Commonly known as:  novoLOG  Inject 0-24 Units into the skin 3 (three) times daily. On sliding scale     insulin detemir 100 UNIT/ML injection  Commonly known as:  LEVEMIR  Inject 32 Units into the skin at bedtime.     levothyroxine 25 MCG tablet  Commonly known as:   SYNTHROID, LEVOTHROID  Take 25 mcg by mouth daily before breakfast.     Magnesium 250 MG Tabs  Take 1 tablet by mouth daily.     mirtazapine 15 MG tablet  Commonly known as:  REMERON  Take 1 tablet (15 mg total) by mouth at bedtime.     PRESERVISION AREDS 2 Caps  Take 1 capsule by mouth daily.     simvastatin 80 MG tablet  Commonly known as:  ZOCOR  Take 80 mg by mouth daily.        Hospital Procedures: US Renal  12/15/2014   CLINICAL DATA:  Acute renal failure, acute on chronic  EXAM: RENAL/URINARY TRACT ULTRASOUND COMPLETE  COMPARISON:  None.  FINDINGS: Right Kidney:  Length: 9 cm. Renal cortical thinning. Echogenicity within normal limits. No mass or hydronephrosis visualized.  Left Kidney:  Length: 10 cm. Renal cortical thinning. Echogenicity within normal limits. No mass or hydronephrosis visualized.  Bladder:  Appears normal for degree of bladder distention.  Other:  Incidental note made of gallstones.  IMPRESSION: 1. Bilateral renal cortical thinning consistent with chronic renal disease. 2. No obstructive uropathy. 3. Cholelithiasis.   Electronically Signed   By: Elige Ko   On: 12/15/2014 17:43   Dg Chest Port 1 [address]  12/17/2014   CLINICAL DATA:  CHF.  Cough.  EXAM: PORTABLE CHEST - 1 VIEW  COMPARISON:  12/15/2014.  FINDINGS: Mediastinum and hilar structures normal. Stable cardiomegaly with mild pulmonary vascular prominence interstitial prominence consistent congestive heart failure. Small left pleural effusion. No pneumothorax. No acute bony abnormality.  IMPRESSION: Persistent cardiomegaly with mild pulmonary vascular prominence and interstitial prominence consistent with congestive heart failure with pulmonary interstitial edema. Small left pleural effusion cannot be excluded. No significant interval change.   Electronically Signed   By: Maisie Fushomas  Register   On: 12/17/2014 07:29   Dg Chest Port 1 View  12/15/2014   CLINICAL DATA:  81 year old female with shortness of breath  and lower extremity swelling. Initial encounter.  EXAM: PORTABLE CHEST - 1 VIEW  COMPARISON:  12/05/2010 and earlier.  FINDINGS: Portable AP semi upright view at 1432 hrs. Stable cardiac size allowing for portable technique. Other mediastinal contours are within normal limits. Calcified atherosclerosis of the aorta. No pneumothorax. Increased pulmonary vascularity. No definite effusion. Superimposed perihilar opacity favored to be atelectasis. No consolidation.  IMPRESSION: Increased pulmonary vascularity compatible with mild interstitial edema. Increased perihilar opacity, favor atelectasis.   Electronically Signed   By: Odessa FlemingH  Hall M.D.   On: 12/15/2014 14:48   ------------------------------------------------------------------- Transthoracic Echocardiography  Patient:  Donna Hurst, Donna M MR #:    [id number] Study Date: 12/16/2014 Gender:   F Age:    [age] Height:   152.4 cm Weight:   93.9 kg BSA:    2.05 m^2 Pt. Status: Room:    6E03C  ADMITTING  Guerry Bruinisovec, Chiniqua Kilcrease [phone] ATTENDING  Guerry Bruinisovec, Karol Liendo [id number] ORDERING   Baileigh Modisette [id number] SONOGRAPHER Jeryl ColumbiaJohanna Elliott PERFORMING  [facility], Inpatient REFERRING  Trust Crago, Rich  cc:  ------------------------------------------------------------------- LV EF: 55% -  60%  ------------------------------------------------------------------- Indications:   CHF - 428.0.  ------------------------------------------------------------------- History:  PMH: Acute Renal Disease, Elevated Troponin. Dyspnea. Congestive heart failure.  ------------------------------------------------------------------- Study Conclusions  - Left ventricle: The cavity size was normal. Systolic function was normal. The estimated ejection fraction was in the range of 55% to 60%. Wall motion was normal; there were no regional wall motion abnormalities. There was a reduced contribution of atrial contraction to ventricular filling,  due to increased ventricular diastolic pressure or atrial contractile dysfunction. Doppler parameters are consistent with a reversible restrictive pattern, indicative of decreased left ventricular diastolic compliance and/or increased left atrial pressure (grade 3 diastolic dysfunction). - Aortic valve: Mild thickening and calcification, consistent with sclerosis. There was mild stenosis. Valve area (VTI): 1.31 cm^2. Valve area (Vmax): 1.31 cm^2. Valve area (Vmean): 1.34 cm^2. - Mitral valve: There was mild regurgitation. - Left atrium: The atrium was mildly dilated. - Pulmonary arteries: PA peak pressure: 43 mm Hg (S).  Impressions:  - The right ventricular systolic pressure was increased consistent with moderate pulmonary hypertension.  Transthoracic echocardiography. M-mode, complete 2D, spectral Doppler, and color Doppler. Birthdate: Patient birthdate: 06-30-1932. Age: Patient is 81 yr old. Sex: Gender: female. BMI: 40.4 kg/m^2. Blood pressure:   140/105 Patient status: Inpatient. Study date: Study date: 12/16/2014. Study time: 12:02 PM. Location: Bedside.  -------------------------------------------------------------------  ------------------------------------------------------------------- Left ventricle: The cavity size was normal. Systolic function was normal. The estimated ejection fraction was in the range of 55% to 60%. Wall motion was normal; there were no regional wall motion abnormalities. There was a reduced contribution of atrial contraction to ventricular filling, due to increased ventricular diastolic pressure or atrial contractile dysfunction. Doppler parameters are consistent with a reversible restrictive pattern, indicative of decreased  left ventricular diastolic compliance and/or increased left atrial pressure (grade 3 diastolic dysfunction).  ------------------------------------------------------------------- Aortic valve:   Trileaflet. Mild thickening and calcification, consistent with sclerosis. Mobility was not restricted. Doppler: There was mild stenosis.  There was no regurgitation.  VTI ratio of LVOT to aortic valve: 0.42. Valve area (VTI): 1.31 cm^2. Indexed valve area (VTI): 0.64 cm^2/m^2. Valve area (Vmax): 1.31 cm^2. Indexed valve area (Vmax): 0.64 cm^2/m^2. Mean velocity ratio of LVOT to aortic valve: 0.43. Valve area (Vmean): 1.34 cm^2. Indexed valve area (Vmean): 0.66 cm^2/m^2.  Mean gradient (S): 13 mm Hg. Peak gradient (S): 27 mm Hg.  ------------------------------------------------------------------- Aorta: Aortic root: The aortic root was normal in size.  ------------------------------------------------------------------- Mitral valve:  Structurally normal valve.  Mobility was not restricted. Doppler: Transvalvular velocity was within the normal range. There was no evidence for stenosis. There was mild regurgitation.  Peak gradient (D): 10 mm Hg.  ------------------------------------------------------------------- Left atrium: The atrium was mildly dilated.  ------------------------------------------------------------------- Right ventricle: The cavity size was normal. Wall thickness was normal. Systolic function was normal.  ------------------------------------------------------------------- Pulmonic valve:  Doppler: Transvalvular velocity was within the normal range. There was no evidence for stenosis.  ------------------------------------------------------------------- Tricuspid valve:  Structurally normal valve.  Doppler: Transvalvular velocity was within the normal range. There was mild regurgitation.  ------------------------------------------------------------------- Pulmonary artery:  The main pulmonary artery was normal-sized. Systolic pressure was within the normal range.  ------------------------------------------------------------------- Right  atrium: The atrium was normal in size.  ------------------------------------------------------------------- Pericardium: There was no pericardial effusion.  ------------------------------------------------------------------- Systemic veins: Inferior vena cava: The vessel was normal in size.  ------------------------------------------------------------------- Measurements  Left ventricle              Value     Reference LV ID, ED, PLAX chordal      (L)   41.9 mm    43 - 52 LV ID, ES, PLAX chordal          27  mm    23 - 38 LV fx shortening, PLAX chordal      36  %    >=29 LV PW thickness, ED            11.8 mm    --------- IVS/LV PW ratio, ED            0.97      <=1.3 Stroke volume, 2D             83  ml    --------- Stroke volume/bsa, 2D           41  ml/m^2  --------- LV e&', lateral              7.51 cm/s   --------- LV E/e&', lateral             21.57     --------- LV e&', medial               5.11 cm/s   --------- LV E/e&', medial              31.7      --------- LV e&', average              6.31 cm/s   --------- LV E/e&', average             25.67     ---------  Ventricular septum            Value     Reference IVS thickness, ED  11.4 mm    ---------  LVOT                   Value     Reference LVOT ID, S                20  mm    --------- LVOT area                 3.14 cm^2   --------- LVOT peak velocity, S           108  cm/s   --------- LVOT mean velocity, S           73.1 cm/s   --------- LVOT VTI, S                26.5 cm    --------- LVOT peak gradient, S            5   mm Hg  ---------  Aortic valve               Value     Reference Aortic valve mean velocity, S       171  cm/s   --------- Aortic valve VTI, S            63.5 cm    --------- Aortic mean gradient, S          13  mm Hg  --------- Aortic peak gradient, S          27  mm Hg  --------- VTI ratio, LVOT/AV            0.42      --------- Aortic valve area, VTI          1.31 cm^2   --------- Aortic valve area/bsa, VTI        0.64 cm^2/m^2 --------- Aortic valve area, peak velocity     1.31 cm^2   --------- Aortic valve area/bsa, peak        0.64 cm^2/m^2 --------- velocity Velocity ratio, mean, LVOT/AV       0.43      --------- Aortic valve area, mean velocity     1.34 cm^2   --------- Aortic valve area/bsa, mean        0.66 cm^2/m^2 --------- velocity  Aorta                   Value     Reference Aortic root ID, ED            25  mm    ---------  Left atrium                Value     Reference LA ID, A-P, ES              38  mm    --------- LA ID/bsa, A-P              1.86 cm/m^2  <=2.2 LA volume, S               67.6 ml    --------- LA volume/bsa, S             33  ml/m^2  --------- LA volume, ES, 1-p A4C          50.7 ml    --------- LA volume/bsa, ES, 1-p A4C        24.8 ml/m^2  --------- LA volume, ES, 1-p A2C  90.3 ml    --------- LA volume/bsa, ES, 1-p A2C        44.1 ml/m^2  ---------  Mitral valve               Value     Reference Mitral E-wave peak velocity        162  cm/s   --------- Mitral A-wave peak velocity        80.5 cm/s   --------- Mitral deceleration time      (H)   257  ms    150 - 230 Mitral peak gradient, D          10  mm Hg  --------- Mitral E/A ratio, peak          2       ---------  Pulmonary arteries            Value     Reference PA pressure, S, DP        (H)   43  mm Hg  <=30  Tricuspid valve              Value     Reference Tricuspid regurg peak velocity      316  cm/s   --------- Tricuspid peak RV-RA gradient       40  mm Hg  ---------  Systemic veins              Value     Reference Estimated CVP               3   mm Hg  ---------  Right ventricle              Value     Reference RV pressure, S, DP        (H)   43  mm Hg  <=30  Legend: (L) and (H) mark values outside specified reference range.  ------------------------------------------------------------------- Prepared and Electronically Authenticated by  Armanda Magic, MD 2016-03-09T15:38:30  History of Present Illness: Patient is an 81 year old female longstanding patient of mine who presented to our office with fluid overload, shortness of breath and acute renal inufficiency.  Direct admission was attempted but [facility] facility indicated that they had no telemetry beds, so was sent to the ER for admission  Hospital Course: I admitted Mrs. Prabhu through the ER on the 8th.  She was seen in the office with fluid overload and a worsening renal function as well as shortness of breath.  Please see H+P for further details.  She was placed on  IV Lasix and her BP was monitored closely as she was initially in a low range.  Cardiology consult was undertaken.  She was last seen by Cards in 2011 and had an episode of fluid overload following self d/c of diuretics.  She states she has been compliant with her home Lasix.  There was a mild elevation in troponin, likely due to her CHF as well as her  acute on chronic renal failure.  Echocardiogram showed preserved EF of 60%, but grade 3 diastolic dysfunction and moderate pulmonary HTN.  Her Norvasc was restarted as her BP had now improved and not dropped further with diuresis. Renal consult was initially obtained to help with diuretics, which she thankfully responded well to as she has had past outpatient discussions on dialysis needs going forward and has refused this.  Her cough continued some, she was given dextromethorphan cough syrup as well as albuterol nebs on a  PRN basis.  Does not have an oxygen requirement.  Diabetes control was quite good on inpatient regimen, but went a bit low overnight her last evening here and her insulin dose was reduced.  Her initial glucose was >400.  She has had issues with insulin and dietary compliance in the past.  She feels like she is back to baseline and will be discharged on 80mg  bid Lasix, which is an increased dose for her.  She has a Nephrologist who will arrange his own follow up and outpatient Cardiology follow up is at their discretion as they indicated  No need to pursue an ischemic workup in their last consult note. Day of Discharge Exam BP 146/87 mmHg  Pulse 58  Temp(Src) 98.3 F (36.8 C) (Oral)  Resp 17  Ht 5' (1.524 m)  Wt 94.05 kg (207 lb 5.5 oz)  BMI 40.49 kg/m2  SpO2 94%  Physical Exam: General appearance: alert, cooperative and appears stated age Nose: Nares normal. Septum midline. Mucosa normal. No drainage or sinus tenderness. Neck: no adenopathy, no carotid bruit, no JVD, supple, symmetrical, trachea midline and thyroid not enlarged, symmetric, no tenderness/mass/nodules Resp: rhonchi bibasilar improved, very slight only with forced deep inspiration. Cardio: regular rate and rhythm, S1, S2 normal, no murmur, click, rub or gallop GI: soft, non-tender; bowel sounds normal; no masses, no organomegaly Extremities: edema trace bilaterally Neurologic: Alert and oriented X 3, normal  strength and tone. Normal symmetric reflexes. Normal coordination and gait   Discharge Labs:  Recent Labs  12/17/14 0721 12/18/14 0539  NA 139 144  K 3.6 3.5  CL 95* 99  CO2 36* 34*  GLUCOSE 103* 58*  BUN 92* 81*  CREATININE 2.30* 2.25*  CALCIUM 9.3 9.2   Recent Labs  12/17/14 0721 12/18/14 0539  WBC 14.2* 13.7*  HGB 9.1* 8.9*  HCT 28.0* 27.6*  MCV 97.2 96.8  PLT 434* 449*   Lab Results  Component Value Date   INR 1.22 01/21/2010    Recent Labs  12/15/14 2108 12/16/14 0313 12/16/14 0815  TROPONINI 0.21* 0.16* 0.25*    Recent Labs  12/15/14 2108  TSH 1.310    Recent Labs  12/17/14 1020  VITAMINB12 819  FOLATE 18.6  FERRITIN 987*  TIBC 209*  IRON 17*  RETICCTPCT 2.8    Discharge instructions: Take meds as prescribed, may use OTC cough syrups if needed.    Disposition: Home Follow-up Appts: Follow-up with Dr. Wylene Simmer at [facility] Monday or Tuesday next week.  Shanon will call and arrange Condition on Discharge: Stable, improved  Tests Needing Follow-up: BMET with next OV  Signed: Aimee Timmons W 12/18/2014, 8:03 AM

## 2014-12-18 NOTE — Progress Notes (Signed)
Patient ID: Donna Hurst, female   DOB: 04/20/1932, 81 y.o.   MRN: [id number]  [facility] Progress Note    Assessment/ Plan:   1. Acute renal failure on chronic kidney disease stage IV: This appears to be hemodynamically mediated with what appears to be CHF exacerbation. Good response to diuretic therapy with improving renal function-transitioned to oral diuretic therapy today (furosemide 80 mg twice a day) to allow for discharge home. Renal follow-up arranged. 2. CHF exacerbation: Ongoing management of diuretics, continue to follow urine output/renal function. Seen by cardiology- continue outpatient follow-up is recommended. 3. Anemia of chronic kidney disease: Hemoglobin lower, restart ESA, will give single dose of Feraheme 510 mg IV today for low iron saturation. 4. Hyponatremia: Improving with better renal function/glycemic control. 5. Hyperkalemia: Improved/corrected with diuresis/urine output 6. Insulin-dependent diabetes mellitus: Management per primary service  Subjective:   Reports to be feeling better and with improved SOB- denies chest pain   Objective:   BP 146/87 mmHg  Pulse 58  Temp(Src) 98.3 F (36.8 C) (Oral)  Resp 17  Ht 5' (1.524 m)  Wt 94.05 kg (207 lb 5.5 oz)  BMI 40.49 kg/m2  SpO2 94%  Intake/Output Summary (Last 24 hours) at 12/18/14 0949 Last data filed at 12/18/14 0500  Gross per 24 hour  Intake 790.17 ml  Output   1271 ml  Net -480.83 ml   Weight change:   Physical Exam: WUJ:[id number] up in recliner AOZ:HYQMV regular bradycardia, normal s1 and s2 Resp:Decreased BS over bases, no rales HQI:ONGE, obese, NT Ext: 1-2+ lower extremity edema  Imaging: Dg Chest Port 1 View  12/17/2014   CLINICAL DATA:  CHF.  Cough.  EXAM: PORTABLE CHEST - 1 VIEW  COMPARISON:  12/15/2014.  FINDINGS: Mediastinum and hilar structures normal. Stable cardiomegaly with mild pulmonary vascular prominence interstitial prominence consistent congestive heart  failure. Small left pleural effusion. No pneumothorax. No acute bony abnormality.  IMPRESSION: Persistent cardiomegaly with mild pulmonary vascular prominence and interstitial prominence consistent with congestive heart failure with pulmonary interstitial edema. Small left pleural effusion cannot be excluded. No significant interval change.   Electronically Signed   By: Maisie Fus  Register   On: 12/17/2014 07:29    Labs: BMET  Recent Labs Lab 12/15/14 1431 12/15/14 2108 12/16/14 0313 12/17/14 0721 12/18/14 0539  NA 131*  --  132* 139 144  K 5.2*  --  4.6 3.6 3.5  CL 93*  --  92* 95* 99  CO2  --   --  28 36* 34*  GLUCOSE 416*  --  228* 103* 58*  BUN 104*  --  105* 92* 81*  CREATININE 3.00* 3.03* 2.95* 2.30* 2.25*  CALCIUM  --   --  9.0 9.3 9.2   CBC  Recent Labs Lab 12/15/14 2108 12/16/14 0313 12/17/14 0721 12/18/14 0539  WBC 11.4* 11.3* 14.2* 13.7*  HGB 9.2* 9.0* 9.1* 8.9*  HCT 27.8* 27.1* 28.0* 27.6*  MCV 95.2 94.4 97.2 96.8  PLT 401* 378 434* 449*    Medications:    . allopurinol  100 mg Oral Daily  . amLODipine  2.5 mg Oral Daily  . aspirin  325 mg Oral Daily  . atenolol  100 mg Oral Daily  . atorvastatin  40 mg Oral q1800  . dextrose      . docusate sodium  100 mg Oral BID  . enoxaparin (LOVENOX) injection  30 mg Subcutaneous Q24H  . feeding supplement (NEPRO CARB STEADY)  237 mL Oral BID BM  .  ferrous sulfate  325 mg Oral BID WC  . ferumoxytol  510 mg Intravenous Once  . furosemide  40 mg Intravenous BID  . insulin aspart  0-15 Units Subcutaneous TID WC  . insulin aspart  4 Units Subcutaneous TID WC  . insulin detemir  36 Units Subcutaneous QHS  . levothyroxine  25 mcg Oral QAC breakfast  . mirtazapine  15 mg Oral QHS  . multivitamin with minerals  1 tablet Oral Daily  . pantoprazole  40 mg Oral Daily  . sodium chloride  3 mL Intravenous Q12H   Zetta BillsJay Fahed Morten, MD 12/18/2014, 9:49 AM

## 2015-02-03 ENCOUNTER — Ambulatory Visit (INDEPENDENT_AMBULATORY_CARE_PROVIDER_SITE_OTHER): Payer: Medicare Other | Admitting: Cardiovascular Disease

## 2015-02-03 ENCOUNTER — Encounter: Payer: Self-pay | Admitting: Cardiovascular Disease

## 2015-02-03 VITALS — BP 150/60 | HR 66 | Ht 60.0 in | Wt 194.4 lb

## 2015-02-03 DIAGNOSIS — I5033 Acute on chronic diastolic (congestive) heart failure: Secondary | ICD-10-CM

## 2015-02-03 DIAGNOSIS — I350 Nonrheumatic aortic (valve) stenosis: Secondary | ICD-10-CM | POA: Diagnosis not present

## 2015-02-03 DIAGNOSIS — N184 Chronic kidney disease, stage 4 (severe): Secondary | ICD-10-CM

## 2015-02-03 DIAGNOSIS — N189 Chronic kidney disease, unspecified: Secondary | ICD-10-CM | POA: Insufficient documentation

## 2015-02-03 NOTE — Assessment & Plan Note (Signed)
Euvolemic related to HTN and CRF  Continue current lasix dose 80 bid

## 2015-02-03 NOTE — Patient Instructions (Signed)
Medication Instructions:  NO CHANGES  Labwork: NONE  Testing/Procedures: Your physician has requested that you have an echocardiogram. Echocardiography is a painless test that uses sound waves to create images of your heart. It provides your doctor with information about the size and shape of your heart and how well your heart's chambers and valves are working. This procedure takes approximately one hour. There are no restrictions for this procedure. IN  6 MONTHS  Follow-Up: Your physician wants you to follow-up in:   6 MONTHS WITH DR Eden EmmsNISHAN ECHO SAME DAY  You will receive a reminder letter in the mail two months in advance. If you don't receive a letter, please call our office to schedule the follow-up appointment.  Any Other Special Instructions Will Be Listed Below (If Applicable). \

## 2015-02-03 NOTE — Assessment & Plan Note (Signed)
F/U Dr Rich Reiningoldanato She does not want dialysis in future.  Low sodium low protein diet Avoid nephrotoxins

## 2015-02-03 NOTE — Progress Notes (Signed)
Cardiology Office Note   Date:  02/03/2015   ID:  CHAKIA COUNTS, DOB 21-Dec-1931, MRN [id number]  PCP:  No primary care provider on file.  Cardiologist:   Charlton Haws, MD   No chief complaint on file.     History of Present Illness: LASHAI GROSCH is a 81 y.o. female who presents for post hospital f/u diastolic failure in setting of CRF.  D/C on 3/16  After being admitted from Dr Wetzel Bjornstad office She was placed on  IV Lasix and her BP was monitored closely as she was initially in a low range. Cardiology consult was undertaken. She was last seen by Cards in 2011 and had an episode of fluid overload following self d/c of diuretics. She states she has been compliant with her home Lasix. There was a mild elevation in troponin, likely due to her CHF as well as her acute on chronic renal failure. Echocardiogram showed preserved EF of 60%, but grade 3 diastolic dysfunction and moderate pulmonary HTN. Her Norvasc was restarted as her BP had now improved and not dropped further with diuresis. Renal consult was initially obtained to help with diuretics, which she thankfully responded well to as she has had past outpatient discussions on dialysis needs going forward and has refused this.  12/18/14  Cr 2.25   Past Medical History  Diagnosis Date  . Hypertension   . Diabetes mellitus with renal manifestation   . CKD (chronic kidney disease), stage IV   . Secondary hyperparathyroidism   . Anemia of chronic disease   . Obesity   . Dyslipidemia   . Hypothyroidism   . Chronic diastolic CHF (congestive heart failure)   . Gout   . Osteoarthritis   . Edema   . Systolic murmur     a. Noted in 2011; 2D Echo 01/2010: poor acoustic quality, normal wall thickness, EF 60-65%, mild-mod TR, PASP .    Past Surgical History  Procedure Laterality Date  . Rotator cuff repair    . Hip surgery      For OA  . Back surgery       Current Outpatient Prescriptions  Medication Sig Dispense  Refill  . allopurinol (ZYLOPRIM) 300 MG tablet Take 300 mg by mouth daily.    Marland Kitchen amLODipine (NORVASC) 5 MG tablet Take 5 mg by mouth daily.    Marland Kitchen aspirin EC 81 MG tablet Take 81 mg by mouth daily.    Marland Kitchen atenolol (TENORMIN) 100 MG tablet Take 100 mg by mouth daily.    . calcium carbonate (OS-CAL) 600 MG TABS tablet Take 600 mg by mouth 2 (two) times daily with a meal.    . docusate sodium (COLACE) 100 MG capsule Take 100 mg by mouth 2 (two) times daily.    . ferrous sulfate 325 (65 FE) MG tablet Take 1 tablet (325 mg total) by mouth 2 (two) times daily with a meal. 60 tablet 3  . furosemide (LASIX) 40 MG tablet Take 2 tablets (80 mg total) by mouth 2 (two) times daily. 120 tablet 5  . insulin aspart (NOVOLOG) 100 UNIT/ML injection Inject 0-24 Units into the skin 3 (three) times daily. On sliding scale    . insulin detemir (LEVEMIR) 100 UNIT/ML injection Inject 32 Units into the skin at bedtime.    Marland Kitchen levothyroxine (SYNTHROID, LEVOTHROID) 25 MCG tablet Take 25 mcg by mouth daily before breakfast.    . Magnesium 250 MG TABS Take 1 tablet by mouth daily.    Marland Kitchen  mirtazapine (REMERON) 15 MG tablet Take 1 tablet (15 mg total) by mouth at bedtime. 30 tablet 3  . Multiple Vitamins-Minerals (PRESERVISION AREDS 2) CAPS Take 1 capsule by mouth daily.    . simvastatin (ZOCOR) 80 MG tablet Take 80 mg by mouth daily.     No current facility-administered medications for this visit.    Allergies:   Penicillins    Social History:  The patient  reports that she has quit smoking. She does not have any smokeless tobacco history on file. She reports that she does not drink alcohol or use illicit drugs.   Family History:  The patient's family history includes CAD in her brother, brother, and father; Diabetes in her mother; Hypertension in her mother.    ROS:  Please see the history of present illness.   Otherwise, review of systems are positive for none.   All other systems are reviewed and negative.    PHYSICAL  EXAM: VS:  There were no vitals taken for this visit. , BMI There is no weight on file to calculate BMI. Affect appropriate Overweight white female  HEENT: normal Neck supple with no adenopathy JVP normal no bruits no thyromegaly Lungs clear with no wheezing and good diaphragmatic motion Heart:  S1/S2 AS murmur, no rub, gallop or click PMI normal Abdomen: benighn, BS positve, no tenderness, no AAA no bruit.  No HSM or HJR Distal pulses intact with no bruits Trace LE edema Neuro non-focal Skin warm and dry No muscular weakness    EKG:   12/16/14  SB rate 59 poor R wave progression no acute changes    Recent Labs: 12/15/2014: B Natriuretic Peptide 1070.9*; TSH 1.310 12/18/2014: BUN 81*; Creatinine 2.25*; Hemoglobin 8.9*; Platelets 449*; Potassium 3.5; Sodium 144    Lipid Panel No results found for: CHOL, TRIG, HDL, CHOLHDL, VLDL, LDLCALC, LDLDIRECT    Wt Readings from Last 3 Encounters:  12/16/14 207 lb 5.5 oz (94.05 kg)      Other studies Reviewed: Additional studies/ records that were reviewed today include: Epic notes and notes from [facility].    ASSESSMENT AND PLAN:  1.  SEE problem list   Current medicines are reviewed at length with the patient today.  The patient does not have concerns regarding medicines.  The following changes have been made:  no change  Labs/ tests ordered today include:  Ech for AS in 6 months  No orders of the defined types were placed in this encounter.     Disposition:   FU with me in 6 months      Signed, Charlton HawsPeter Samaad Hashem, MD  02/03/2015 12:09 PM    [facility] [address], [place], [place]  [address] Phone: [phone]; Fax: [phone]

## 2015-02-03 NOTE — Assessment & Plan Note (Signed)
Reviewed echo from March Mild with mean gradient 13 mmhg and peak 27 mmHg  F/U echo in 6 months

## 2015-08-05 ENCOUNTER — Ambulatory Visit (INDEPENDENT_AMBULATORY_CARE_PROVIDER_SITE_OTHER): Payer: Commercial Managed Care - HMO | Admitting: Ophthalmology

## 2015-08-11 NOTE — Progress Notes (Signed)
Patient ID: Donna Hurst, female   DOB: 05-13-1932, 81 y.o.   MRN: [id number]     Cardiology Office Note   Date:  08/13/2015   ID:  Donna Hurst, DOB 08/31/1932, MRN [id number]  PCP:  Gaspar Garbe, MD  Cardiologist:   Charlton Haws, MD   No chief complaint on file.     History of Present Illness: Donna Hurst is a 81 y.o. female who presents for post hospital f/u diastolic failure in setting of CRF.  D/C on 3/16  After being admitted from Dr Wetzel Bjornstad office She was placed on  IV Lasix and her BP was monitored closely as she was initially in a low range. Cardiology consult was undertaken. She was last seen by Cards in 2011 and had an episode of fluid overload following self d/c of diuretics. She states she has been compliant with her home Lasix. There was a mild elevation in troponin, likely due to her CHF as well as her acute on chronic renal failure. Echocardiogram showed preserved EF of 60%, but grade 3 diastolic dysfunction and moderate pulmonary HTN. Her Norvasc was restarted as her BP had now improved and not dropped further with diuresis. Renal consult was initially obtained to help with diuretics, which she thankfully responded well to as she has had past outpatient discussions on dialysis needs going forward and has refused this.  12/18/14  Cr 2.25   Echo 12/16/14 Study Conclusions  - Left ventricle: The cavity size was normal. Systolic function was normal. The estimated ejection fraction was in the range of 55% to 60%. Wall motion was normal; there were no regional wall motion abnormalities. There was a reduced contribution of atrial contraction to ventricular filling, due to increased ventricular diastolic pressure or atrial contractile dysfunction. Doppler parameters are consistent with a reversible restrictive pattern, indicative of decreased left ventricular diastolic compliance and/or increased left atrial pressure (grade 3  diastolic dysfunction). - Aortic valve: Mild thickening and calcification, consistent with sclerosis. There was mild stenosis. Valve area (VTI): 1.31 cm^2. Valve area (Vmax): 1.31 cm^2. Valve area (Vmean): 1.34 cm^2. - Mitral valve: There was mild regurgitation. - Left atrium: The atrium was mildly dilated. - Pulmonary arteries: PA peak pressure: 43 mm Hg (S).  Impressions:  - The right ventricular systolic pressure was increased consistent with moderate pulmonary hypertension.  Past Medical History  Diagnosis Date  . Hypertension   . Diabetes mellitus with renal manifestation (HCC)   . CKD (chronic kidney disease), stage IV (HCC)   . Secondary hyperparathyroidism (HCC)   . Anemia of chronic disease   . Obesity   . Dyslipidemia   . Hypothyroidism   . Chronic diastolic CHF (congestive heart failure) (HCC)   . Gout   . Osteoarthritis   . Edema   . Systolic murmur     a. Noted in 2011; 2D Echo 01/2010: poor acoustic quality, normal wall thickness, EF 60-65%, mild-mod TR, PASP .    Past Surgical History  Procedure Laterality Date  . Rotator cuff repair    . Hip surgery      For OA  . Back surgery       Current Outpatient Prescriptions  Medication Sig Dispense Refill  . allopurinol (ZYLOPRIM) 300 MG tablet Take 300 mg by mouth daily.    Marland Kitchen amLODipine (NORVASC) 5 MG tablet Take 5 mg by mouth daily.    Marland Kitchen aspirin EC 81 MG tablet Take 81 mg by mouth daily.    Marland Kitchen atenolol (TENORMIN)  100 MG tablet Take 100 mg by mouth daily.    . calcium carbonate (OS-CAL) 600 MG TABS tablet Take 600 mg by mouth 2 (two) times daily with a meal.    . docusate sodium (COLACE) 100 MG capsule Take 100 mg by mouth daily as needed for mild constipation or moderate constipation.     . furosemide (LASIX) 40 MG tablet Take 2 tablets (80 mg total) by mouth 2 (two) times daily. 120 tablet 5  . insulin aspart (NOVOLOG) 100 UNIT/ML injection Inject 0-24 Units into the skin 3 (three) times daily.  On sliding scale    . insulin detemir (LEVEMIR) 100 UNIT/ML injection Inject 32 Units into the skin at bedtime.    Marland Kitchen. levothyroxine (SYNTHROID, LEVOTHROID) 25 MCG tablet Take 25 mcg by mouth daily before breakfast.    . mirtazapine (REMERON) 15 MG tablet Take 1 tablet (15 mg total) by mouth at bedtime. 30 tablet 3  . Multiple Vitamins-Minerals (PRESERVISION AREDS 2) CAPS Take 1 capsule by mouth daily.    . simvastatin (ZOCOR) 80 MG tablet Take 80 mg by mouth daily.    . traMADol (ULTRAM) 50 MG tablet Take 50 mg by mouth as needed for moderate pain.     No current facility-administered medications for this visit.    Allergies:   Penicillins    Social History:  The patient  reports that she has quit smoking. She does not have any smokeless tobacco history on file. She reports that she does not drink alcohol or use illicit drugs.   Family History:  The patient's family history includes CAD in her brother, brother, and father; Diabetes in her mother; Hypertension in her mother.    ROS:  Please see the history of present illness.   Otherwise, review of systems are positive for none.   All other systems are reviewed and negative.    PHYSICAL EXAM: VS:  BP 160/59 mmHg  Pulse 64  Ht 5' (1.524 m)  Wt 91.808 kg (202 lb 6.4 oz)  BMI 39.53 kg/m2 , BMI Body mass index is 39.53 kg/(m^2). Affect appropriate Overweight white female  HEENT: normal Neck supple with no adenopathy JVP normal referred murmur vs bruits no thyromegaly Lungs clear with no wheezing and good diaphragmatic motion Heart:  S1/S2 AS murmur, no rub, gallop or click PMI normal Abdomen: benighn, BS positve, no tenderness, no AAA no bruit.  No HSM or HJR Distal pulses intact with no bruits Trace LE edema Neuro non-focal Skin warm and dry No muscular weakness    EKG:   12/16/14  SB rate 59 poor R wave progression no acute changes    Recent Labs: 12/15/2014: B Natriuretic Peptide 1070.9*; TSH 1.310 12/18/2014: BUN 81*;  Creatinine, Ser 2.25*; Hemoglobin 8.9*; Platelets 449*; Potassium 3.5; Sodium 144    Lipid Panel No results found for: CHOL, TRIG, HDL, CHOLHDL, VLDL, LDLCALC, LDLDIRECT    Wt Readings from Last 3 Encounters:  08/13/15 91.808 kg (202 lb 6.4 oz)  02/03/15 88.179 kg (194 lb 6.4 oz)  12/16/14 94.05 kg (207 lb 5.5 oz)      Other studies Reviewed: Additional studies/ records that were reviewed today include: Epic notes and notes from [facility].    ASSESSMENT AND PLAN:  1.aortic Stenosis  Mild by last echo S2 present has echo scheduled for today 2. Diastolic CHF  Improved no dyspnea but very low functional status 3. CRF stable f/u Dr Rich Reiningoldanato she does not want dialysis  4. HTN: Well controlled.  Continue current medications and low sodium Dash type diet.  ] 5. Bruit:  Not clear if referred murmur or bruit will get duplex of neck     Current medicines are reviewed at length with the patient today.  The patient does not have concerns regarding medicines.  The following changes have been made:  no change  Labs/ tests ordered today include:  Echo and carotid    No orders of the defined types were placed in this encounter.     Disposition:   FU with me in 6 months      Signed, Charlton Haws, MD  08/13/2015 9:39 AM    [facility] [address] [place], [place], [place]  [address] Phone: [phone]; Fax: [phone]

## 2015-08-12 ENCOUNTER — Ambulatory Visit (INDEPENDENT_AMBULATORY_CARE_PROVIDER_SITE_OTHER): Payer: Medicare Other | Admitting: Ophthalmology

## 2015-08-12 DIAGNOSIS — H35033 Hypertensive retinopathy, bilateral: Secondary | ICD-10-CM

## 2015-08-12 DIAGNOSIS — I10 Essential (primary) hypertension: Secondary | ICD-10-CM | POA: Diagnosis not present

## 2015-08-12 DIAGNOSIS — E113293 Type 2 diabetes mellitus with mild nonproliferative diabetic retinopathy without macular edema, bilateral: Secondary | ICD-10-CM | POA: Diagnosis not present

## 2015-08-12 DIAGNOSIS — E11319 Type 2 diabetes mellitus with unspecified diabetic retinopathy without macular edema: Secondary | ICD-10-CM

## 2015-08-12 DIAGNOSIS — H43813 Vitreous degeneration, bilateral: Secondary | ICD-10-CM

## 2015-08-12 DIAGNOSIS — H353132 Nonexudative age-related macular degeneration, bilateral, intermediate dry stage: Secondary | ICD-10-CM | POA: Diagnosis not present

## 2015-08-13 ENCOUNTER — Other Ambulatory Visit: Payer: Self-pay | Admitting: Cardiovascular Disease

## 2015-08-13 ENCOUNTER — Ambulatory Visit (HOSPITAL_COMMUNITY): Payer: Medicare Other | Attending: Cardiology

## 2015-08-13 ENCOUNTER — Ambulatory Visit (INDEPENDENT_AMBULATORY_CARE_PROVIDER_SITE_OTHER): Payer: Medicare Other | Admitting: Cardiovascular Disease

## 2015-08-13 ENCOUNTER — Encounter: Payer: Self-pay | Admitting: Cardiovascular Disease

## 2015-08-13 ENCOUNTER — Other Ambulatory Visit: Payer: Self-pay

## 2015-08-13 VITALS — BP 160/59 | HR 64 | Ht 60.0 in | Wt 202.4 lb

## 2015-08-13 DIAGNOSIS — R0989 Other specified symptoms and signs involving the circulatory and respiratory systems: Secondary | ICD-10-CM

## 2015-08-13 DIAGNOSIS — Z87891 Personal history of nicotine dependence: Secondary | ICD-10-CM | POA: Insufficient documentation

## 2015-08-13 DIAGNOSIS — I350 Nonrheumatic aortic (valve) stenosis: Secondary | ICD-10-CM

## 2015-08-13 DIAGNOSIS — I5033 Acute on chronic diastolic (congestive) heart failure: Secondary | ICD-10-CM

## 2015-08-13 DIAGNOSIS — I071 Rheumatic tricuspid insufficiency: Secondary | ICD-10-CM | POA: Insufficient documentation

## 2015-08-13 DIAGNOSIS — I517 Cardiomegaly: Secondary | ICD-10-CM | POA: Insufficient documentation

## 2015-08-13 NOTE — Patient Instructions (Addendum)
Medication Instructions:  Your physician recommends that you continue on your current medications as directed. Please refer to the Current Medication list given to you today.   Labwork: NONE  Testing/Procedures: Your physician has requested that you have a carotid duplex. This test is an ultrasound of the carotid arteries in your neck. It looks at blood flow through these arteries that supply the brain with blood. Allow one hour for this exam. There are no restrictions or special instructions.   Follow-Up: Your physician wants you to follow-up in:   6 MONTHS  WITH  DR NISHAN  You will receive a reminder letter in the mail two months in advance. If you don't receive a letter, please call our office to schedule the follow-up appointment.  Any Other Special Instructions Will Be Listed Below (If Applicable).     If you need a refill on your cardiac medications before your next appointment, please call your pharmacy. \ 

## 2015-08-17 ENCOUNTER — Ambulatory Visit (HOSPITAL_COMMUNITY)
Admission: RE | Admit: 2015-08-17 | Discharge: 2015-08-17 | Disposition: A | Discharge status: Home / Self Care | Payer: Medicare Other | Source: Ambulatory Visit | Attending: Cardiovascular Disease | Admitting: Cardiovascular Disease

## 2015-08-17 DIAGNOSIS — R0989 Other specified symptoms and signs involving the circulatory and respiratory systems: Secondary | ICD-10-CM | POA: Diagnosis present

## 2015-08-17 DIAGNOSIS — I129 Hypertensive chronic kidney disease with stage 1 through stage 4 chronic kidney disease, or unspecified chronic kidney disease: Secondary | ICD-10-CM | POA: Insufficient documentation

## 2015-08-17 DIAGNOSIS — I6523 Occlusion and stenosis of bilateral carotid arteries: Secondary | ICD-10-CM | POA: Diagnosis not present

## 2015-08-17 DIAGNOSIS — E119 Type 2 diabetes mellitus without complications: Secondary | ICD-10-CM | POA: Diagnosis not present

## 2015-08-17 DIAGNOSIS — R938 Abnormal findings on diagnostic imaging of other specified body structures: Secondary | ICD-10-CM | POA: Diagnosis not present

## 2015-08-17 DIAGNOSIS — I6502 Occlusion and stenosis of left vertebral artery: Secondary | ICD-10-CM | POA: Diagnosis not present

## 2015-08-17 DIAGNOSIS — E785 Hyperlipidemia, unspecified: Secondary | ICD-10-CM | POA: Insufficient documentation

## 2015-08-17 DIAGNOSIS — N184 Chronic kidney disease, stage 4 (severe): Secondary | ICD-10-CM | POA: Diagnosis not present

## 2015-10-11 ENCOUNTER — Emergency Department (HOSPITAL_COMMUNITY): Payer: PPO

## 2015-10-11 ENCOUNTER — Encounter (HOSPITAL_COMMUNITY): Payer: Self-pay | Admitting: Emergency Medicine

## 2015-10-11 ENCOUNTER — Emergency Department (HOSPITAL_COMMUNITY)
Admission: EM | Admit: 2015-10-11 | Discharge: 2015-10-11 | Disposition: A | Discharge status: Home / Self Care | Payer: PPO | Attending: Emergency Medicine | Admitting: Emergency Medicine

## 2015-10-11 DIAGNOSIS — Z794 Long term (current) use of insulin: Secondary | ICD-10-CM | POA: Insufficient documentation

## 2015-10-11 DIAGNOSIS — Y9289 Other specified places as the place of occurrence of the external cause: Secondary | ICD-10-CM | POA: Diagnosis not present

## 2015-10-11 DIAGNOSIS — M1990 Unspecified osteoarthritis, unspecified site: Secondary | ICD-10-CM | POA: Insufficient documentation

## 2015-10-11 DIAGNOSIS — M79672 Pain in left foot: Secondary | ICD-10-CM | POA: Diagnosis not present

## 2015-10-11 DIAGNOSIS — N184 Chronic kidney disease, stage 4 (severe): Secondary | ICD-10-CM | POA: Diagnosis not present

## 2015-10-11 DIAGNOSIS — Z880 Allergy status to penicillin: Secondary | ICD-10-CM | POA: Diagnosis not present

## 2015-10-11 DIAGNOSIS — S99922A Unspecified injury of left foot, initial encounter: Secondary | ICD-10-CM | POA: Diagnosis not present

## 2015-10-11 DIAGNOSIS — I5032 Chronic diastolic (congestive) heart failure: Secondary | ICD-10-CM | POA: Insufficient documentation

## 2015-10-11 DIAGNOSIS — E785 Hyperlipidemia, unspecified: Secondary | ICD-10-CM | POA: Diagnosis not present

## 2015-10-11 DIAGNOSIS — W208XXA Other cause of strike by thrown, projected or falling object, initial encounter: Secondary | ICD-10-CM | POA: Diagnosis not present

## 2015-10-11 DIAGNOSIS — S9032XA Contusion of left foot, initial encounter: Secondary | ICD-10-CM

## 2015-10-11 DIAGNOSIS — Z87891 Personal history of nicotine dependence: Secondary | ICD-10-CM | POA: Insufficient documentation

## 2015-10-11 DIAGNOSIS — M109 Gout, unspecified: Secondary | ICD-10-CM | POA: Diagnosis not present

## 2015-10-11 DIAGNOSIS — D631 Anemia in chronic kidney disease: Secondary | ICD-10-CM | POA: Diagnosis not present

## 2015-10-11 DIAGNOSIS — R011 Cardiac murmur, unspecified: Secondary | ICD-10-CM | POA: Diagnosis not present

## 2015-10-11 DIAGNOSIS — Z7982 Long term (current) use of aspirin: Secondary | ICD-10-CM | POA: Diagnosis not present

## 2015-10-11 DIAGNOSIS — I129 Hypertensive chronic kidney disease with stage 1 through stage 4 chronic kidney disease, or unspecified chronic kidney disease: Secondary | ICD-10-CM | POA: Insufficient documentation

## 2015-10-11 DIAGNOSIS — Y998 Other external cause status: Secondary | ICD-10-CM | POA: Diagnosis not present

## 2015-10-11 DIAGNOSIS — Z79899 Other long term (current) drug therapy: Secondary | ICD-10-CM | POA: Insufficient documentation

## 2015-10-11 DIAGNOSIS — E669 Obesity, unspecified: Secondary | ICD-10-CM | POA: Insufficient documentation

## 2015-10-11 DIAGNOSIS — E211 Secondary hyperparathyroidism, not elsewhere classified: Secondary | ICD-10-CM | POA: Insufficient documentation

## 2015-10-11 DIAGNOSIS — Y9389 Activity, other specified: Secondary | ICD-10-CM | POA: Diagnosis not present

## 2015-10-11 DIAGNOSIS — M7989 Other specified soft tissue disorders: Secondary | ICD-10-CM | POA: Diagnosis not present

## 2015-10-11 NOTE — ED Provider Notes (Signed)
CSN: [id number]     Arrival date & time 10/11/15  1033 History   First MD Initiated Contact with Patient 10/11/15 1150     Chief Complaint  Patient presents with  . Foot Pain     (Consider location/radiation/quality/duration/timing/severity/associated sxs/prior Treatment) HPI Comments: Dropped 2lb bottle of shower gel on foot Happened Saturday night Been walking on it but with pain Pain improved, 3-4/10 Better with rest Walking on it worse No blood thinners   Patient is a 82 y.o. female presenting with lower extremity pain.  Foot Pain Pertinent negatives include no chest pain, no abdominal pain, no headaches and no shortness of breath.    Past Medical History  Diagnosis Date  . Hypertension   . Diabetes mellitus with renal manifestation (HCC)   . CKD (chronic kidney disease), stage IV (HCC)   . Secondary hyperparathyroidism (HCC)   . Anemia of chronic disease   . Obesity   . Dyslipidemia   . Hypothyroidism   . Chronic diastolic CHF (congestive heart failure) (HCC)   . Gout   . Osteoarthritis   . Edema   . Systolic murmur     a. Noted in 2011; 2D Echo 01/2010: poor acoustic quality, normal wall thickness, EF 60-65%, mild-mod TR, PASP 40mmHg.   Past Surgical History  Procedure Laterality Date  . Rotator cuff repair    . Hip surgery      For OA  . Back surgery     Family History  Problem Relation Age of Onset  . CAD Father     Died of MI at 7151  . CAD Brother     s/p CABG  . CAD Brother     Died at 341 of MI  . Diabetes Mother   . Hypertension Mother    Social History  Substance Use Topics  . Smoking status: Former Games developermoker  . Smokeless tobacco: None     Comment: Smoked for <10 years  . Alcohol Use: No   OB History    No data available     Review of Systems  Constitutional: Negative for fever.  HENT: Negative for sore throat.   Eyes: Negative for visual disturbance.  Respiratory: Negative for cough and shortness of breath.   Cardiovascular: Negative  for chest pain.  Gastrointestinal: Negative for abdominal pain.  Genitourinary: Negative for difficulty urinating.  Musculoskeletal: Positive for arthralgias. Negative for back pain and neck pain.  Skin: Negative for rash.  Neurological: Negative for syncope and headaches.      Allergies  Penicillins  Home Medications   Prior to Admission medications   Medication Sig Start Date End Date Taking? Authorizing Provider  allopurinol (ZYLOPRIM) 300 MG tablet Take 300 mg by mouth daily.    Historical Provider, MD  amLODipine (NORVASC) 5 MG tablet Take 5 mg by mouth daily.    Historical Provider, MD  aspirin EC 81 MG tablet Take 81 mg by mouth daily.    Historical Provider, MD  atenolol (TENORMIN) 100 MG tablet Take 100 mg by mouth daily.    Historical Provider, MD  calcium carbonate (OS-CAL) 600 MG TABS tablet Take 600 mg by mouth 2 (two) times daily with a meal.    Historical Provider, MD  docusate sodium (COLACE) 100 MG capsule Take 100 mg by mouth daily as needed for mild constipation or moderate constipation.     Historical Provider, MD  furosemide (LASIX) 40 MG tablet Take 2 tablets (80 mg total) by mouth 2 (two) times daily. 12/18/14  Gaspar Garbe, MD  insulin aspart (NOVOLOG) 100 UNIT/ML injection Inject 0-24 Units into the skin 3 (three) times daily. On sliding scale    Historical Provider, MD  insulin detemir (LEVEMIR) 100 UNIT/ML injection Inject 32 Units into the skin at bedtime.    Historical Provider, MD  levothyroxine (SYNTHROID, LEVOTHROID) 25 MCG tablet Take 25 mcg by mouth daily before breakfast.    Historical Provider, MD  mirtazapine (REMERON) 15 MG tablet Take 1 tablet (15 mg total) by mouth at bedtime. 12/18/14   Gaspar Garbe, MD  Multiple Vitamins-Minerals (PRESERVISION AREDS 2) CAPS Take 1 capsule by mouth daily.    Historical Provider, MD  simvastatin (ZOCOR) 80 MG tablet Take 80 mg by mouth daily.    Historical Provider, MD  traMADol (ULTRAM) 50 MG tablet Take  50 mg by mouth as needed for moderate pain.    Historical Provider, MD   BP 143/41 mmHg  Pulse 67  Temp(Src) 98.1 F (36.7 C) (Oral)  Resp 20  SpO2 91% Physical Exam  Constitutional: She is oriented to person, place, and time. She appears well-developed and well-nourished. No distress.  HENT:  Head: Normocephalic and atraumatic.  Eyes: Conjunctivae and EOM are normal.  Neck: Normal range of motion.  Cardiovascular: Normal rate, regular rhythm and intact distal pulses.   Pulmonary/Chest: Effort normal.  Musculoskeletal: She exhibits no edema or tenderness.  Left dorsal lateral foot swelling, contusion Normal ROM of toes, normal sensation, normal pulses bilaterally  Neurological: She is alert and oriented to person, place, and time.  Skin: Skin is warm and dry. No rash noted. She is not diaphoretic. No erythema.  Nursing note and vitals reviewed.   ED Course  Procedures (including critical care time) Labs Review Labs Reviewed - No data to display  Imaging Review Dg Foot Complete Left  10/11/2015  CLINICAL DATA:  Pain and bruising to the dorsal aspect of the foot after blunt trauma 2 days ago. EXAM: LEFT FOOT - COMPLETE 3+ VIEW COMPARISON:  None. FINDINGS: There is no fracture or dislocation. There is prominent dorsal soft tissue swelling over the metatarsals. Arterial vascular calcifications are noted. Moderate arthritis at the ankle joint. IMPRESSION: No acute osseous abnormality. Prominent soft tissue swelling over the dorsum of the forefoot. Electronically Signed   By: Francene Boyers M.D.   On: 10/11/2015 12:12   I have personally reviewed and evaluated these images and lab results as part of my medical decision-making.   EKG Interpretation None      MDM   Final diagnoses:  Left foot pain  Foot contusion, left, initial encounter   82yo female with hx of htn, DM, diastolic HF, aortic stenosis, dyslipidemia, presents with concern for left foot pain after dropping a heavy  bottle of shampoo on it on Saturday.  Pt NV intact, no sign of compartment syndrome.  XR shows no fracture. Pt with swelling, provided with postop shoe for comfort. Discussed elevation, ice. Patient discharged in stable condition with understanding of reasons to return.     Alvira Monday, MD 10/11/15 2223

## 2015-10-11 NOTE — ED Notes (Signed)
Pt c/o left foot pain after dropping 2 lb bottle onto foot. Foot is ecchymotic and grossly edematous.

## 2015-10-11 NOTE — Discharge Instructions (Signed)

## 2015-11-03 DIAGNOSIS — M25572 Pain in left ankle and joints of left foot: Secondary | ICD-10-CM | POA: Diagnosis not present

## 2015-11-03 DIAGNOSIS — S9032XA Contusion of left foot, initial encounter: Secondary | ICD-10-CM | POA: Diagnosis not present

## 2015-11-03 DIAGNOSIS — M79672 Pain in left foot: Secondary | ICD-10-CM | POA: Diagnosis not present

## 2015-11-11 DIAGNOSIS — M76822 Posterior tibial tendinitis, left leg: Secondary | ICD-10-CM | POA: Diagnosis not present

## 2015-11-11 DIAGNOSIS — S9032XD Contusion of left foot, subsequent encounter: Secondary | ICD-10-CM | POA: Diagnosis not present

## 2015-11-16 DIAGNOSIS — N2581 Secondary hyperparathyroidism of renal origin: Secondary | ICD-10-CM | POA: Diagnosis not present

## 2015-11-16 DIAGNOSIS — N189 Chronic kidney disease, unspecified: Secondary | ICD-10-CM | POA: Diagnosis not present

## 2015-11-16 DIAGNOSIS — N184 Chronic kidney disease, stage 4 (severe): Secondary | ICD-10-CM | POA: Diagnosis not present

## 2015-11-19 DIAGNOSIS — D631 Anemia in chronic kidney disease: Secondary | ICD-10-CM | POA: Diagnosis not present

## 2015-11-19 DIAGNOSIS — M109 Gout, unspecified: Secondary | ICD-10-CM | POA: Diagnosis not present

## 2015-11-19 DIAGNOSIS — N184 Chronic kidney disease, stage 4 (severe): Secondary | ICD-10-CM | POA: Diagnosis not present

## 2015-11-19 DIAGNOSIS — E1129 Type 2 diabetes mellitus with other diabetic kidney complication: Secondary | ICD-10-CM | POA: Diagnosis not present

## 2015-11-19 DIAGNOSIS — E039 Hypothyroidism, unspecified: Secondary | ICD-10-CM | POA: Diagnosis not present

## 2015-11-19 DIAGNOSIS — I10 Essential (primary) hypertension: Secondary | ICD-10-CM | POA: Diagnosis not present

## 2015-11-19 DIAGNOSIS — N2581 Secondary hyperparathyroidism of renal origin: Secondary | ICD-10-CM | POA: Diagnosis not present

## 2015-11-19 DIAGNOSIS — I509 Heart failure, unspecified: Secondary | ICD-10-CM | POA: Diagnosis not present

## 2015-11-25 DIAGNOSIS — S9032XA Contusion of left foot, initial encounter: Secondary | ICD-10-CM | POA: Diagnosis not present

## 2015-11-25 DIAGNOSIS — M12272 Villonodular synovitis (pigmented), left ankle and foot: Secondary | ICD-10-CM | POA: Diagnosis not present

## 2015-12-13 DIAGNOSIS — I5032 Chronic diastolic (congestive) heart failure: Secondary | ICD-10-CM | POA: Diagnosis not present

## 2015-12-13 DIAGNOSIS — M109 Gout, unspecified: Secondary | ICD-10-CM | POA: Diagnosis not present

## 2015-12-13 DIAGNOSIS — N184 Chronic kidney disease, stage 4 (severe): Secondary | ICD-10-CM | POA: Diagnosis not present

## 2015-12-13 DIAGNOSIS — E1129 Type 2 diabetes mellitus with other diabetic kidney complication: Secondary | ICD-10-CM | POA: Diagnosis not present

## 2015-12-13 DIAGNOSIS — Z1389 Encounter for screening for other disorder: Secondary | ICD-10-CM | POA: Diagnosis not present

## 2015-12-13 DIAGNOSIS — E038 Other specified hypothyroidism: Secondary | ICD-10-CM | POA: Diagnosis not present

## 2015-12-13 DIAGNOSIS — E782 Mixed hyperlipidemia: Secondary | ICD-10-CM | POA: Diagnosis not present

## 2015-12-13 DIAGNOSIS — Z6838 Body mass index (BMI) 38.0-38.9, adult: Secondary | ICD-10-CM | POA: Diagnosis not present

## 2015-12-13 DIAGNOSIS — I10 Essential (primary) hypertension: Secondary | ICD-10-CM | POA: Diagnosis not present

## 2015-12-13 DIAGNOSIS — Z794 Long term (current) use of insulin: Secondary | ICD-10-CM | POA: Diagnosis not present

## 2015-12-23 DIAGNOSIS — S9032XA Contusion of left foot, initial encounter: Secondary | ICD-10-CM | POA: Diagnosis not present

## 2016-02-09 NOTE — Progress Notes (Signed)
Patient ID: Donna Hurst, female   DOB: 1931-12-16, 82 y.o.   MRN: [id number]     Cardiology Office Note   Date:  02/10/2016   ID:  Donna Hurst, DOB 29-Jan-1932, MRN [id number]  PCP:  Gaspar Garbe, MD  Cardiologist:   Charlton Haws, MD   Chief Complaint  Patient presents with  . Aortic Stenosis    no sx      History of Present Illness: Donna Hurst is a 82 y.o. female who presents for post hospital f/u diastolic failure in setting of CRF.  D/C on 3/16  After being admitted from Dr Wetzel Bjornstad office She was placed on  IV Lasix and her BP was monitored closely as she was initially in a low range. Cardiology consult was undertaken. She was last seen by Cards in 2011 and had an episode of fluid overload following self d/c of diuretics. She states she has been compliant with her home Lasix. There was a mild elevation in troponin, likely due to her CHF as well as her acute on chronic renal failure. Echocardiogram showed preserved EF of 60%, but grade 3 diastolic dysfunction and moderate pulmonary HTN. Her Norvasc was restarted as her BP had now improved and not dropped further with diuresis. Renal consult was initially obtained to help with diuretics, which she thankfully responded well to as she has had past outpatient discussions on dialysis needs going forward and has refused this.  11/16  Mild to moderate AS  Study Conclusions  - Left ventricle: The cavity size was normal. There was moderate  concentric hypertrophy. Systolic function was normal. The  estimated ejection fraction was in the range of 60% to 65%. Wall  motion was normal; there were no regional wall motion  abnormalities. Doppler parameters are consistent with abnormal  left ventricular relaxation (grade 1 diastolic dysfunction).  There was no evidence of elevated ventricular filling pressure by  Doppler parameters. - Aortic valve: Valve mobility was restricted. There was mild to  moderate stenosis.  Mean gradient (S): 19 mm Hg. Peak gradient  (S): 32 mm Hg. Valve area (VTI): 1.32 cm^2. Valve area (Vmax):  1.11 cm^2. Valve area (Vmean): 1.17 cm^2. - Aortic root: The aortic root was normal in size. - Right ventricle: The cavity size was normal. Wall thickness was  normal. Systolic function was normal. - Right atrium: The atrium was normal in size. - Tricuspid valve: There was trivial regurgitation. - Pulmonary arteries: Systolic pressure was within the normal  range. - Inferior vena cava: The vessel was normal in size. - Pericardium, extracardiac: There was no pericardial effusion.  Impressions:  - When compared to the prior study from 12/16/2014 transaortic  gradients are slightly higher, mean now 19 mmHg, previously 13  mmHg.  Mild pulmonary hypertension is no longer present.  Kidneys doing ok  No TIAls compliant with meds   Past Medical History  Diagnosis Date  . Hypertension   . Diabetes mellitus with renal manifestation (HCC)   . CKD (chronic kidney disease), stage IV (HCC)   . Secondary hyperparathyroidism (HCC)   . Anemia of chronic disease   . Obesity   . Dyslipidemia   . Hypothyroidism   . Chronic diastolic CHF (congestive heart failure) (HCC)   . Gout   . Osteoarthritis   . Edema   . Systolic murmur     a. Noted in 2011; 2D Echo 01/2010: poor acoustic quality, normal wall thickness, EF 60-65%, mild-mod TR, PASP .  Past Surgical History  Procedure Laterality Date  . Rotator cuff repair    . Hip surgery      For OA  . Back surgery       Current Outpatient Prescriptions  Medication Sig Dispense Refill  . allopurinol (ZYLOPRIM) 300 MG tablet Take 300 mg by mouth daily.    Marland Kitchen. amLODipine (NORVASC) 5 MG tablet Take 5 mg by mouth daily.    Marland Kitchen. aspirin EC 81 MG tablet Take 81 mg by mouth daily.    Marland Kitchen. atenolol (TENORMIN) 100 MG tablet Take 100 mg by mouth daily.    Marland Kitchen. docusate sodium (COLACE) 100 MG capsule Take 100 mg by mouth daily as needed for  mild constipation or moderate constipation.     . furosemide (LASIX) 40 MG tablet Take 2 tablets (80 mg total) by mouth 2 (two) times daily. 120 tablet 5  . insulin aspart (NOVOLOG) 100 UNIT/ML injection Inject 0-24 Units into the skin 3 (three) times daily. On sliding scale    . insulin detemir (LEVEMIR) 100 UNIT/ML injection Inject 32 Units into the skin at bedtime.    Marland Kitchen. levothyroxine (SYNTHROID, LEVOTHROID) 25 MCG tablet Take 25 mcg by mouth daily before breakfast.    . mirtazapine (REMERON) 15 MG tablet Take 1 tablet (15 mg total) by mouth at bedtime. 30 tablet 3  . Multiple Vitamins-Minerals (PRESERVISION AREDS 2) CAPS Take 1 capsule by mouth daily.    . simvastatin (ZOCOR) 80 MG tablet Take 80 mg by mouth daily.    . traMADol (ULTRAM) 50 MG tablet Take 50 mg by mouth as needed for moderate pain.     No current facility-administered medications for this visit.    Allergies:   Penicillins    Social History:  The patient  reports that she has quit smoking. She does not have any smokeless tobacco history on file. She reports that she does not drink alcohol or use illicit drugs.   Family History:  The patient's family history includes CAD in her brother, brother, and father; Diabetes in her mother; Hypertension in her mother.    ROS:  Please see the history of present illness.   Otherwise, review of systems are positive for none.   All other systems are reviewed and negative.    PHYSICAL EXAM: VS:  BP 160/62 mmHg  Pulse 58  Ht 5' (1.524 m)  Wt 86.818 kg (191 lb 6.4 oz)  BMI 37.38 kg/m2  SpO2 95% , BMI Body mass index is 37.38 kg/(m^2). Affect appropriate Overweight white female  HEENT: normal Neck supple with no adenopathy JVP normal referred murmur vs bruits no thyromegaly Lungs clear with no wheezing and good diaphragmatic motion Heart:  S1/S2 AS murmur, no rub, gallop or click PMI normal Abdomen: benighn, BS positve, no tenderness, no AAA no bruit.  No HSM or HJR Distal  pulses intact with no bruits Trace LE edema Neuro non-focal Skin warm and dry No muscular weakness    EKG:   12/16/14  SB rate 59 poor R wave progression no acute changes  02/10/16 SR rate 59 LAD low voltage    Recent Labs: No results found for requested labs within last 365 days.    Lipid Panel No results found for: CHOL, TRIG, HDL, CHOLHDL, VLDL, LDLCALC, LDLDIRECT    Wt Readings from Last 3 Encounters:  02/10/16 86.818 kg (191 lb 6.4 oz)  08/13/15 91.808 kg (202 lb 6.4 oz)  02/03/15 88.179 kg (194 lb 6.4 oz)    Lab  Results  Component Value Date   CREATININE 2.25* 12/18/2014   BUN 81* 12/18/2014   NA 144 12/18/2014   K 3.5 12/18/2014   CL 99 12/18/2014   CO2 34* 12/18/2014     Other studies Reviewed: Additional studies/ records that were reviewed today include: Epic notes and notes from [facility].    ASSESSMENT AND PLAN:  1.Aortic Stenosis   Mild to moderate echo 08/2015 mean gradient 19 mmHg F/U echo June 2017  2. Diastolic CHF  Improved no dyspnea but very low functional status 3. CRF stable f/u Dr Rich Reining she does not want dialysis  4. HTN: Well controlled.  Continue current medications and low sodium Dash type diet.  ] 5. Bruit:   08/17/15 duplex reviewed 40-59% bilateral ICA stenosis f/u duplex 08/2016     Current medicines are reviewed at length with the patient today.  The patient does not have concerns regarding medicines.  The following changes have been made:  no change  Labs/ tests ordered today include:    No orders of the defined types were placed in this encounter.     Disposition:   FU with me in 6 months      Signed, Charlton Haws, MD  02/10/2016 10:03 AM    [facility] [address] [place], [place], [place]  [address] Phone: [phone]; Fax: [phone]

## 2016-02-10 ENCOUNTER — Ambulatory Visit (INDEPENDENT_AMBULATORY_CARE_PROVIDER_SITE_OTHER): Payer: PPO | Admitting: Cardiovascular Disease

## 2016-02-10 ENCOUNTER — Encounter: Payer: Self-pay | Admitting: Cardiovascular Disease

## 2016-02-10 VITALS — BP 160/62 | HR 58 | Ht 60.0 in | Wt 191.4 lb

## 2016-02-10 DIAGNOSIS — I350 Nonrheumatic aortic (valve) stenosis: Secondary | ICD-10-CM | POA: Diagnosis not present

## 2016-02-10 DIAGNOSIS — R0989 Other specified symptoms and signs involving the circulatory and respiratory systems: Secondary | ICD-10-CM | POA: Diagnosis not present

## 2016-02-10 NOTE — Patient Instructions (Signed)
Medication Instructions:  Your physician recommends that you continue on your current medications as directed. Please refer to the Current Medication list given to you today.  Labwork: NONE  Testing/Procedures: Your physician has requested that you have a carotid duplex. This test is an ultrasound of the carotid arteries in your neck. It looks at blood flow through these arteries that supply the brain with blood. Allow one hour for this exam. There are no restrictions or special instructions.  Your physician has requested that you have an echocardiogram. Echocardiography is a painless test that uses sound waves to create images of your heart. It provides your doctor with information about the size and shape of your heart and how well your heart's chambers and valves are working. This procedure takes approximately one hour. There are no restrictions for this procedure   Follow-Up: Your physician wants you to follow-up in: 6 months with Dr. Eden EmmsNishan. You will receive a reminder letter in the mail two months in advance. If you don't receive a letter, please call our office to schedule the follow-up appointment.  If you need a refill on your cardiac medications before your next appointment, please call your pharmacy.

## 2016-03-15 ENCOUNTER — Ambulatory Visit (HOSPITAL_COMMUNITY): Payer: PPO | Attending: Cardiology

## 2016-03-15 ENCOUNTER — Other Ambulatory Visit: Payer: Self-pay

## 2016-03-15 DIAGNOSIS — I350 Nonrheumatic aortic (valve) stenosis: Secondary | ICD-10-CM | POA: Insufficient documentation

## 2016-03-15 DIAGNOSIS — I110 Hypertensive heart disease with heart failure: Secondary | ICD-10-CM | POA: Diagnosis not present

## 2016-03-15 DIAGNOSIS — I340 Nonrheumatic mitral (valve) insufficiency: Secondary | ICD-10-CM | POA: Insufficient documentation

## 2016-03-15 DIAGNOSIS — I272 Other secondary pulmonary hypertension: Secondary | ICD-10-CM | POA: Insufficient documentation

## 2016-03-15 DIAGNOSIS — I509 Heart failure, unspecified: Secondary | ICD-10-CM | POA: Diagnosis not present

## 2016-03-15 LAB — ECHOCARDIOGRAM COMPLETE
AOASC: 28 cm
AOVTI: 75 cm
AV Area VTI: 0.95 cm2
AV Area mean vel: 1.02 cm2
AV VEL mean LVOT/AV: 0.36
AV area mean vel ind: 0.52 cm2/m2
AV vel: 0.99
AVA: 0.99 cm2
AVAREAVTIIND: 0.5 cm2/m2
AVG: 16 mmHg
AVLVOTPG: 4 mmHg
AVPG: 31 mmHg
AVPKVEL: 280 cm/s
Ao pk vel: 0.34 m/s
CHL CUP AV PEAK INDEX: 0.48
CHL CUP RV SYS PRESS: 41 mmHg
DOP CAL AO MEAN VELOCITY: 186 cm/s
EERAT: 23.79
EWDT: 285 ms
FS: 35 % (ref 28–44)
IVS/LV PW RATIO, ED: 1.08
LA ID, A-P, ES: 44 mm
LA diam end sys: 44 mm
LA diam index: 2.24 cm/m2
LA vol: 60 mL
LAVOLA4C: 55 mL
LAVOLIN: 30.6 mL/m2
LV E/e'average: 23.79
LV PW d: 10.2 mm — AB (ref 0.6–1.1)
LV TDI E'LATERAL: 7.02
LV sys vol: 26 mL (ref 14–42)
LVDIAVOL: 71 mL (ref 46–106)
LVDIAVOLIN: 36 mL/m2
LVEEMED: 23.79
LVELAT: 7.02 cm/s
LVOT VTI: 26.2 cm
LVOT area: 2.84 cm2
LVOT peak VTI: 0.35 cm
LVOT peak vel: 94 cm/s
LVOTD: 19 mm
LVOTSV: 74 mL
LVSYSVOLIN: 13 mL/m2
Lateral S' vel: 10.1 cm/s
MV Dec: 285
MV pk A vel: 102 m/s
MV pk E vel: 167 m/s
MVPG: 11 mmHg
Reg peak vel: 307 cm/s
Simpson's disk: 63
Stroke v: 45 ml
TDI e' medial: 5.75
TR max vel: 307 cm/s
Valve area index: 0.5

## 2016-03-19 ENCOUNTER — Encounter (HOSPITAL_COMMUNITY): Payer: Self-pay

## 2016-03-19 ENCOUNTER — Inpatient Hospital Stay (HOSPITAL_COMMUNITY)
Admission: EM | Admit: 2016-03-19 | Discharge: 2016-03-23 | DRG: 291 | Disposition: A | Discharge status: Home Health | Payer: PPO | Attending: Internal Medicine | Admitting: Internal Medicine

## 2016-03-19 ENCOUNTER — Emergency Department (HOSPITAL_COMMUNITY): Payer: PPO

## 2016-03-19 DIAGNOSIS — J154 Pneumonia due to other streptococci: Secondary | ICD-10-CM | POA: Diagnosis not present

## 2016-03-19 DIAGNOSIS — I272 Other secondary pulmonary hypertension: Secondary | ICD-10-CM | POA: Diagnosis present

## 2016-03-19 DIAGNOSIS — I130 Hypertensive heart and chronic kidney disease with heart failure and stage 1 through stage 4 chronic kidney disease, or unspecified chronic kidney disease: Principal | ICD-10-CM | POA: Diagnosis present

## 2016-03-19 DIAGNOSIS — Z6838 Body mass index (BMI) 38.0-38.9, adult: Secondary | ICD-10-CM | POA: Diagnosis not present

## 2016-03-19 DIAGNOSIS — Z66 Do not resuscitate: Secondary | ICD-10-CM | POA: Diagnosis not present

## 2016-03-19 DIAGNOSIS — J189 Pneumonia, unspecified organism: Secondary | ICD-10-CM | POA: Diagnosis not present

## 2016-03-19 DIAGNOSIS — E872 Acidosis: Secondary | ICD-10-CM | POA: Diagnosis not present

## 2016-03-19 DIAGNOSIS — N184 Chronic kidney disease, stage 4 (severe): Secondary | ICD-10-CM | POA: Diagnosis present

## 2016-03-19 DIAGNOSIS — E1122 Type 2 diabetes mellitus with diabetic chronic kidney disease: Secondary | ICD-10-CM | POA: Diagnosis present

## 2016-03-19 DIAGNOSIS — Z79899 Other long term (current) drug therapy: Secondary | ICD-10-CM | POA: Diagnosis not present

## 2016-03-19 DIAGNOSIS — R791 Abnormal coagulation profile: Secondary | ICD-10-CM | POA: Diagnosis present

## 2016-03-19 DIAGNOSIS — Z87891 Personal history of nicotine dependence: Secondary | ICD-10-CM

## 2016-03-19 DIAGNOSIS — Y95 Nosocomial condition: Secondary | ICD-10-CM | POA: Diagnosis not present

## 2016-03-19 DIAGNOSIS — E669 Obesity, unspecified: Secondary | ICD-10-CM | POA: Diagnosis present

## 2016-03-19 DIAGNOSIS — R0602 Shortness of breath: Secondary | ICD-10-CM | POA: Diagnosis not present

## 2016-03-19 DIAGNOSIS — N183 Chronic kidney disease, stage 3 unspecified: Secondary | ICD-10-CM | POA: Diagnosis present

## 2016-03-19 DIAGNOSIS — E785 Hyperlipidemia, unspecified: Secondary | ICD-10-CM | POA: Diagnosis not present

## 2016-03-19 DIAGNOSIS — Z7982 Long term (current) use of aspirin: Secondary | ICD-10-CM | POA: Diagnosis not present

## 2016-03-19 DIAGNOSIS — E1165 Type 2 diabetes mellitus with hyperglycemia: Secondary | ICD-10-CM | POA: Diagnosis not present

## 2016-03-19 DIAGNOSIS — I5033 Acute on chronic diastolic (congestive) heart failure: Secondary | ICD-10-CM | POA: Diagnosis not present

## 2016-03-19 DIAGNOSIS — I071 Rheumatic tricuspid insufficiency: Secondary | ICD-10-CM | POA: Diagnosis not present

## 2016-03-19 DIAGNOSIS — I350 Nonrheumatic aortic (valve) stenosis: Secondary | ICD-10-CM | POA: Diagnosis present

## 2016-03-19 DIAGNOSIS — N289 Disorder of kidney and ureter, unspecified: Secondary | ICD-10-CM

## 2016-03-19 DIAGNOSIS — I447 Left bundle-branch block, unspecified: Secondary | ICD-10-CM | POA: Diagnosis present

## 2016-03-19 DIAGNOSIS — IMO0002 Reserved for concepts with insufficient information to code with codable children: Secondary | ICD-10-CM | POA: Diagnosis present

## 2016-03-19 DIAGNOSIS — Z794 Long term (current) use of insulin: Secondary | ICD-10-CM

## 2016-03-19 DIAGNOSIS — R778 Other specified abnormalities of plasma proteins: Secondary | ICD-10-CM | POA: Diagnosis present

## 2016-03-19 DIAGNOSIS — E876 Hypokalemia: Secondary | ICD-10-CM | POA: Diagnosis not present

## 2016-03-19 DIAGNOSIS — R7989 Other specified abnormal findings of blood chemistry: Secondary | ICD-10-CM

## 2016-03-19 DIAGNOSIS — R0600 Dyspnea, unspecified: Secondary | ICD-10-CM

## 2016-03-19 DIAGNOSIS — M109 Gout, unspecified: Secondary | ICD-10-CM | POA: Diagnosis not present

## 2016-03-19 DIAGNOSIS — E039 Hypothyroidism, unspecified: Secondary | ICD-10-CM | POA: Diagnosis not present

## 2016-03-19 DIAGNOSIS — I509 Heart failure, unspecified: Secondary | ICD-10-CM | POA: Insufficient documentation

## 2016-03-19 DIAGNOSIS — R069 Unspecified abnormalities of breathing: Secondary | ICD-10-CM | POA: Diagnosis not present

## 2016-03-19 DIAGNOSIS — B955 Unspecified streptococcus as the cause of diseases classified elsewhere: Secondary | ICD-10-CM | POA: Diagnosis present

## 2016-03-19 DIAGNOSIS — R7881 Bacteremia: Secondary | ICD-10-CM

## 2016-03-19 HISTORY — DX: Reserved for inherently not codable concepts without codable children: IMO0001

## 2016-03-19 LAB — CBC
HCT: 35.4 % — ABNORMAL LOW (ref 36.0–46.0)
HEMOGLOBIN: 11.1 g/dL — AB (ref 12.0–15.0)
MCH: 31.4 pg (ref 26.0–34.0)
MCHC: 31.4 g/dL (ref 30.0–36.0)
MCV: 100 fL (ref 78.0–100.0)
PLATELETS: 260 10*3/uL (ref 150–400)
RBC: 3.54 MIL/uL — AB (ref 3.87–5.11)
RDW: 16.2 % — ABNORMAL HIGH (ref 11.5–15.5)
WBC: 20.7 10*3/uL — AB (ref 4.0–10.5)

## 2016-03-19 LAB — BASIC METABOLIC PANEL
ANION GAP: 17 — AB (ref 5–15)
BUN: 87 mg/dL — ABNORMAL HIGH (ref 6–20)
CHLORIDE: 92 mmol/L — AB (ref 101–111)
CO2: 26 mmol/L (ref 22–32)
CREATININE: 2.41 mg/dL — AB (ref 0.44–1.00)
Calcium: 9 mg/dL (ref 8.9–10.3)
GFR calc non Af Amer: 18 mL/min — ABNORMAL LOW (ref >60)
GFR, EST AFRICAN AMERICAN: 20 mL/min — AB (ref >60)
Glucose, Bld: 366 mg/dL — ABNORMAL HIGH (ref 65–99)
POTASSIUM: 3.6 mmol/L (ref 3.5–5.1)
SODIUM: 135 mmol/L (ref 135–145)

## 2016-03-19 LAB — I-STAT ARTERIAL BLOOD GAS, ED
ACID-BASE EXCESS: 3 mmol/L — AB (ref 0.0–2.0)
BICARBONATE: 27.3 meq/L — AB (ref 20.0–24.0)
O2 SAT: 98 %
PH ART: 7.443 (ref 7.350–7.450)
PO2 ART: 109 mmHg — AB (ref 80.0–100.0)
Patient temperature: 98.6
TCO2: 28 mmol/L (ref 0–100)
pCO2 arterial: 39.8 mmHg (ref 35.0–45.0)

## 2016-03-19 LAB — BRAIN NATRIURETIC PEPTIDE: B NATRIURETIC PEPTIDE 5: 349.2 pg/mL — AB (ref 0.0–100.0)

## 2016-03-19 LAB — PROTIME-INR
INR: 1.89 — ABNORMAL HIGH (ref 0.00–1.49)
PROTHROMBIN TIME: 21.6 s — AB (ref 11.6–15.2)

## 2016-03-19 LAB — I-STAT CG4 LACTIC ACID, ED: LACTIC ACID, VENOUS: 3.66 mmol/L — AB (ref 0.5–2.0)

## 2016-03-19 LAB — I-STAT TROPONIN, ED: Troponin i, poc: 0.06 ng/mL (ref 0.00–0.08)

## 2016-03-19 LAB — TROPONIN I: Troponin I: 0.06 ng/mL — ABNORMAL HIGH (ref <0.031)

## 2016-03-19 MED ORDER — INSULIN ASPART 100 UNIT/ML ~~LOC~~ SOLN: 20.0000 [IU] | Freq: Every day | SUBCUTANEOUS | Status: DC

## 2016-03-19 MED ORDER — DOCUSATE SODIUM 100 MG PO CAPS: 100.0000 mg | ORAL_CAPSULE | Freq: Every day | ORAL | Status: DC | PRN

## 2016-03-19 MED ORDER — TRAMADOL HCL 50 MG PO TABS
50.0000 mg | ORAL_TABLET | Freq: Every day | ORAL | Status: DC
  Administered 2016-03-20 – 2016-03-22 (×3): 50 mg via ORAL
  Filled 2016-03-19 (×3): qty 1

## 2016-03-19 MED ORDER — ATORVASTATIN CALCIUM 40 MG PO TABS
40.0000 mg | ORAL_TABLET | Freq: Every day | ORAL | Status: DC
  Administered 2016-03-20 – 2016-03-22 (×3): 40 mg via ORAL
  Filled 2016-03-19 (×3): qty 1

## 2016-03-19 MED ORDER — ONDANSETRON HCL 4 MG/2ML IJ SOLN: 4.0000 mg | Freq: Four times a day (QID) | INTRAMUSCULAR | Status: DC | PRN

## 2016-03-19 MED ORDER — ASPIRIN 81 MG PO CHEW
324.0000 mg | CHEWABLE_TABLET | Freq: Once | ORAL | Status: AC
  Administered 2016-03-19: 324 mg via ORAL
  Filled 2016-03-19: qty 4

## 2016-03-19 MED ORDER — INSULIN DETEMIR 100 UNIT/ML ~~LOC~~ SOLN
20.0000 [IU] | Freq: Every day | SUBCUTANEOUS | Status: DC
  Administered 2016-03-20: 20 [IU] via SUBCUTANEOUS
  Filled 2016-03-19 (×2): qty 0.2

## 2016-03-19 MED ORDER — FUROSEMIDE 10 MG/ML IJ SOLN
40.0000 mg | Freq: Every day | INTRAMUSCULAR | Status: DC
  Administered 2016-03-20: 40 mg via INTRAVENOUS
  Filled 2016-03-19: qty 4

## 2016-03-19 MED ORDER — POLYETHYLENE GLYCOL 3350 17 G PO PACK: 17.0000 g | PACK | Freq: Every day | ORAL | Status: DC | PRN

## 2016-03-19 MED ORDER — AMLODIPINE BESYLATE 5 MG PO TABS: 5.0000 mg | ORAL_TABLET | Freq: Every day | ORAL | Status: DC

## 2016-03-19 MED ORDER — MORPHINE SULFATE (PF) 2 MG/ML IV SOLN
2.0000 mg | Freq: Once | INTRAVENOUS | Status: DC
  Filled 2016-03-19: qty 1

## 2016-03-19 MED ORDER — VANCOMYCIN HCL IN DEXTROSE 1-5 GM/200ML-% IV SOLN
1000.0000 mg | Freq: Once | INTRAVENOUS | Status: AC
  Administered 2016-03-20: 1000 mg via INTRAVENOUS
  Filled 2016-03-19: qty 200

## 2016-03-19 MED ORDER — FUROSEMIDE 10 MG/ML IJ SOLN
80.0000 mg | Freq: Once | INTRAMUSCULAR | Status: AC
Start: 2016-03-19 — End: 2016-03-19
  Administered 2016-03-19: 80 mg via INTRAVENOUS
  Filled 2016-03-19: qty 8

## 2016-03-19 MED ORDER — AZTREONAM 1 G IJ SOLR
500.0000 mg | Freq: Three times a day (TID) | INTRAMUSCULAR | Status: DC
  Administered 2016-03-20 – 2016-03-23 (×9): 500 mg via INTRAVENOUS
  Filled 2016-03-19 (×14): qty 0.5

## 2016-03-19 MED ORDER — INSULIN ASPART 100 UNIT/ML ~~LOC~~ SOLN: 0.0000 [IU] | Freq: Three times a day (TID) | SUBCUTANEOUS | Status: DC

## 2016-03-19 MED ORDER — ONDANSETRON HCL 4 MG PO TABS: 4.0000 mg | ORAL_TABLET | Freq: Four times a day (QID) | ORAL | Status: DC | PRN

## 2016-03-19 MED ORDER — ASPIRIN EC 81 MG PO TBEC
81.0000 mg | DELAYED_RELEASE_TABLET | Freq: Every day | ORAL | Status: DC
  Administered 2016-03-20 – 2016-03-23 (×4): 81 mg via ORAL
  Filled 2016-03-19 (×4): qty 1

## 2016-03-19 MED ORDER — ACETAMINOPHEN 650 MG RE SUPP: 650.0000 mg | Freq: Four times a day (QID) | RECTAL | Status: DC | PRN

## 2016-03-19 MED ORDER — ATENOLOL 25 MG PO TABS: 100.0000 mg | ORAL_TABLET | Freq: Every day | ORAL | Status: DC

## 2016-03-19 MED ORDER — LEVOTHYROXINE SODIUM 25 MCG PO TABS
25.0000 ug | ORAL_TABLET | Freq: Every day | ORAL | Status: DC
  Administered 2016-03-20 – 2016-03-23 (×4): 25 ug via ORAL
  Filled 2016-03-19 (×5): qty 1

## 2016-03-19 MED ORDER — ENOXAPARIN SODIUM 30 MG/0.3ML ~~LOC~~ SOLN
30.0000 mg | Freq: Every day | SUBCUTANEOUS | Status: DC
  Filled 2016-03-19 (×5): qty 0.3

## 2016-03-19 MED ORDER — MORPHINE SULFATE (PF) 2 MG/ML IV SOLN
2.0000 mg | Freq: Once | INTRAVENOUS | Status: AC
  Administered 2016-03-19: 2 mg via INTRAVENOUS
  Filled 2016-03-19: qty 1

## 2016-03-19 MED ORDER — ACETAMINOPHEN 325 MG PO TABS
650.0000 mg | ORAL_TABLET | Freq: Four times a day (QID) | ORAL | Status: DC | PRN
Start: 2016-03-19 — End: 2016-03-23

## 2016-03-19 MED ORDER — MIRTAZAPINE 7.5 MG PO TABS
15.0000 mg | ORAL_TABLET | Freq: Every day | ORAL | Status: DC
  Administered 2016-03-20 – 2016-03-22 (×3): 15 mg via ORAL
  Filled 2016-03-19 (×3): qty 2

## 2016-03-19 MED ORDER — ALBUTEROL SULFATE (2.5 MG/3ML) 0.083% IN NEBU
3.0000 mL | INHALATION_SOLUTION | Freq: Once | RESPIRATORY_TRACT | Status: AC
  Administered 2016-03-19: 3 mL via RESPIRATORY_TRACT
  Filled 2016-03-19: qty 3

## 2016-03-19 MED ORDER — VANCOMYCIN HCL 500 MG IV SOLR
500.0000 mg | INTRAVENOUS | Status: DC
  Administered 2016-03-20 – 2016-03-22 (×3): 500 mg via INTRAVENOUS
  Filled 2016-03-19 (×5): qty 500

## 2016-03-19 MED ORDER — AZTREONAM 2 G IJ SOLR
2.0000 g | Freq: Once | INTRAMUSCULAR | Status: AC
  Administered 2016-03-19: 2 g via INTRAVENOUS
  Filled 2016-03-19: qty 2

## 2016-03-19 MED ORDER — ALBUTEROL SULFATE (2.5 MG/3ML) 0.083% IN NEBU: 2.5000 mg | INHALATION_SOLUTION | RESPIRATORY_TRACT | Status: DC | PRN

## 2016-03-19 MED ORDER — DOCUSATE SODIUM 100 MG PO CAPS
100.0000 mg | ORAL_CAPSULE | Freq: Two times a day (BID) | ORAL | Status: DC
  Administered 2016-03-20: 100 mg via ORAL
  Filled 2016-03-19 (×5): qty 1

## 2016-03-19 NOTE — ED Notes (Signed)
Dr Manus Gunningancour notified of chest pain.  New orders per Dr Manus Gunningancour.

## 2016-03-19 NOTE — ED Notes (Signed)
Notified phlebotomy of need to draw blood cultures.

## 2016-03-19 NOTE — H&P (Addendum)
History and Physical  Donna Hurst ZOX:[id number] DOB: Oct 04, 1932 DOA: 03/19/2016   PCP: Gaspar Garbe, MD   Patient coming from: Home via EMS   Chief Complaint: Chest pain, SOB, cough   HPI: Donna Hurst is a 82 y.o. female with medical history significant for diastolic HF, DMII admitted for heart failure, possible PNA. Pt family is not present, tells me she was in her usual state of heatlth until yesterday when she started having burning pain in her chest, like a burning pressure. Center of chest, no radiation. No aggravating or alleviating factors. Hasn't had pain like this before. Also developed cough today, not productive. No fevers, chills. She has associated LE edema, but thinks this is chronic, but does not know if she has gained weight recently.   ED Course: Was found to have poss PNA, LE edema, elevated BNP. Hospitalist consulted for admission.  Review of Systems: Please see HPI for pertinent positives and negatives. A complete 10 system review of systems are otherwise negative.  Past Medical History  Diagnosis Date  . Hypertension   . Diabetes mellitus with renal manifestation (HCC)   . CKD (chronic kidney disease), stage IV (HCC)   . Secondary hyperparathyroidism (HCC)   . Anemia of chronic disease   . Obesity   . Dyslipidemia   . Hypothyroidism   . Chronic diastolic CHF (congestive heart failure) (HCC)   . Gout   . Osteoarthritis   . Edema   . Systolic murmur     a. Noted in 2011; 2D Echo 01/2010: poor acoustic quality, normal wall thickness, EF 60-65%, mild-mod TR, PASP .   Past Surgical History  Procedure Laterality Date  . Rotator cuff repair    . Hip surgery      For OA  . Back surgery      Social History:  reports that she has quit smoking. She does not have any smokeless tobacco history on file. She reports that she does not drink alcohol or use illicit drugs.   Allergies  Allergen Reactions  . Penicillins Hives and Swelling    Has patient  had a PCN reaction causing immediate rash, facial/tongue/throat swelling, SOB or lightheadedness with hypotension: Yes Has patient had a PCN reaction causing severe rash involving mucus membranes or skin necrosis: No Has patient had a PCN reaction that required hospitalization No Has patient had a PCN reaction occurring within the last 10 years: No If all of the above answers are "NO", then may proceed with Cephalosporin use.    Family History  Problem Relation Age of Onset  . CAD Father     Died of MI at 53  . CAD Brother     s/p CABG  . CAD Brother     Died at 53 of MI  . Diabetes Mother   . Hypertension Mother      Prior to Admission medications   Medication Sig Start Date End Date Taking? Authorizing Provider  albuterol (PROVENTIL HFA;VENTOLIN HFA) 108 (90 Base) MCG/ACT inhaler Inhale 1 puff into the lungs every 6 (six) hours as needed for wheezing or shortness of breath.   Yes Historical Provider, MD  allopurinol (ZYLOPRIM) 300 MG tablet Take 300 mg by mouth daily.   Yes Historical Provider, MD  amLODipine (NORVASC) 5 MG tablet Take 5 mg by mouth at bedtime.    Yes Historical Provider, MD  aspirin EC 81 MG tablet Take 81 mg by mouth daily.   Yes Historical Provider, MD  atenolol (  TENORMIN) 100 MG tablet Take 100 mg by mouth at bedtime.    Yes Historical Provider, MD  docusate sodium (COLACE) 100 MG capsule Take 100 mg by mouth daily as needed for mild constipation or moderate constipation.    Yes Historical Provider, MD  furosemide (LASIX) 40 MG tablet Take 2 tablets (80 mg total) by mouth 2 (two) times daily. 12/18/14  Yes Gaspar Garbe, MD  insulin aspart (NOVOLOG) 100 UNIT/ML injection Inject 40 Units into the skin daily with supper.    Yes Historical Provider, MD  insulin detemir (LEVEMIR) 100 UNIT/ML injection Inject 40 Units into the skin at bedtime.    Yes Historical Provider, MD  levothyroxine (SYNTHROID, LEVOTHROID) 25 MCG tablet Take 25 mcg by mouth daily before  breakfast.   Yes Historical Provider, MD  mirtazapine (REMERON) 15 MG tablet Take 1 tablet (15 mg total) by mouth at bedtime. 12/18/14  Yes Gaspar Garbe, MD  Multiple Vitamins-Minerals (PRESERVISION AREDS 2) CAPS Take 1 capsule by mouth daily.   Yes Historical Provider, MD  Multiple Vitamins-Minerals (PRESERVISION AREDS 2) CAPS Take 1 capsule by mouth at bedtime.   Yes Historical Provider, MD  simvastatin (ZOCOR) 80 MG tablet Take 80 mg by mouth at bedtime.    Yes Historical Provider, MD  traMADol (ULTRAM) 50 MG tablet Take 50 mg by mouth at bedtime.    Yes Historical Provider, MD    Physical Exam: BP 102/43 mmHg  Pulse 79  Temp(Src) 100.9 F (38.3 C) (Rectal)  Resp 22  SpO2 95%  General:  Alert, oriented, calm, in mild distress, uncomfortable appearing Eyes: pupils round and reactive to light and accomodation, clear sclerea Neck: supple, no masses, trachea mildline  Cardiovascular: RRR, no murmurs or rubs, she has 2+ pitting BLE edema  Respiratory: diminished, no wheezing, crackles at bases and rhonchi thoughout Abdomen: soft, nontender, nondistended, normal bowel tones heard, obese  Skin: dry, no rashes  Musculoskeletal: no joint effusions, normal range of motion  Psychiatric: appropriate affect, normal speech  Neurologic: extraocular muscles intact, clear speech, moving all extremities with intact sensorium            Labs on Admission:  Basic Metabolic Panel:  Recent Labs Lab 03/19/16 2013  NA 135  K 3.6  CL 92*  CO2 26  GLUCOSE 366*  BUN 87*  CREATININE 2.41*  CALCIUM 9.0   Liver Function Tests: No results for input(s): AST, ALT, ALKPHOS, BILITOT, PROT, ALBUMIN in the last 168 hours. No results for input(s): LIPASE, AMYLASE in the last 168 hours. No results for input(s): AMMONIA in the last 168 hours. CBC:  Recent Labs Lab 03/19/16 2013  WBC 20.7*  HGB 11.1*  HCT 35.4*  MCV 100.0  PLT 260   Cardiac Enzymes:  Recent Labs Lab 03/19/16 2013    TROPONINI 0.06*    BNP (last 3 results)  Recent Labs  03/19/16 2112  BNP 349.2*    ProBNP (last 3 results) No results for input(s): PROBNP in the last 8760 hours.  CBG: No results for input(s): GLUCAP in the last 168 hours.  Radiological Exams on Admission: Dg Chest Portable 1 View  03/19/2016  CLINICAL DATA:  Acute onset of shortness of breath and respiratory distress. Initial encounter. EXAM: PORTABLE CHEST 1 VIEW COMPARISON:  Chest radiograph performed 12/17/2014 FINDINGS: The lungs are well-aerated. Patchy right-sided airspace opacity may reflect asymmetric pulmonary edema or pneumonia. There is no evidence of pleural effusion or pneumothorax. The cardiomediastinal silhouette is borderline normal in size.  No acute osseous abnormalities are seen. IMPRESSION: Patchy right-sided airspace opacity may reflect asymmetric pulmonary edema or pneumonia. Electronically Signed   By: Roanna RaiderJeffery  Chang M.D.   On: 03/19/2016 20:52    Assessment/Plan Present on Admission:  . Acute on chronic diastolic heart failure (HCC) Principal Problem:    Acute on chronic diastolic heart failure (HCC) - has history of grade II diastolic HF on last echo done 8/4/696/7/17. Was given lasix 80mg  IV in ER, starting to feel better but BP is marginally low. - admit inpt to stepdown unit - continue cautious IV lasix, hold for low SBP however - fluid restrict diet, heart healthy diabetic diet - cardiology consulted  Lactic acidosis - possibly due to poor perfusion from fluid overload in addition to PNA - hold further diuresis tonight, bolus in case of tachycardia or hypotension (caution with fluids due to CHF)   CAP  - treat with empiric aztreonam and vanco, supplemental O2 as needed - due to right sided PNA, will consult SLP in case of dsyphagia  Elevated INR - unclear etiology - could she have hepatic dysfunction perhaps due to congestion? Could this explain lactic acidosis as well? - check CMP with next lab  work  DMII  - elevated BG at admission with anion gap, this may also explain acidosis - cont half dose home basal insulin, plus SSI (give a dose of insulin now)  Chest pain - doubt cardiac in nature, but will keep on telemetry - sounds like GERD, improved when I sat her upright - give a GI cocktail now    Heart failure (HCC)   CHF exacerbation (HCC)  DVT prophylaxis: Lovenox   Code Status: FULL   Family Communication: None present, husband came with her to ER but has gone home.   Disposition Plan: Likely home in 2-3 days.   Consults called: None   Admission status: Inpt   Time spent: 65 minutes  Riyan Gavina Vergie LivingMohammed Jakarius Flamenco MD [facility] Pager [phone]  If 7PM-7AM, please contact night-coverage [email or web address] Password [email or web address]  03/19/2016, 11:32 PM

## 2016-03-19 NOTE — Progress Notes (Signed)
Pt removed from bipap at this time and o2 requirements have not been an issue.  Taft available if needed and RT will monitor.

## 2016-03-19 NOTE — ED Provider Notes (Signed)
CSN: [id number]     Arrival date & time 03/19/16  2006 History   First MD Initiated Contact with Patient 03/19/16 2023     Chief Complaint  Patient presents with  . Shortness of Breath   Patient is a 82 y.o. female presenting with shortness of breath.  Shortness of Breath Severity:  Severe Onset quality:  Gradual Duration:  1 day Timing:  Constant Progression:  Worsening Chronicity:  New Context: activity and URI   Relieved by:  Oxygen and diuretics Worsened by:  Exertion Ineffective treatments:  Inhaler Associated symptoms: cough   Associated symptoms: no abdominal pain, no chest pain, no claudication, no diaphoresis, no fever, no headaches, no hemoptysis, no sore throat and no sputum production   Risk factors: obesity   Risk factors: no hx of cancer, no hx of PE/DVT, no prolonged immobilization and no recent surgery      Past Medical History  Diagnosis Date  . Hypertension   . Diabetes mellitus with renal manifestation (HCC)   . CKD (chronic kidney disease), stage IV (HCC)   . Secondary hyperparathyroidism (HCC)   . Anemia of chronic disease   . Obesity   . Dyslipidemia   . Hypothyroidism   . Chronic diastolic CHF (congestive heart failure) (HCC)   . Gout   . Osteoarthritis   . Edema   . Systolic murmur     a. Noted in 2011; 2D Echo 01/2010: poor acoustic quality, normal wall thickness, EF 60-65%, mild-mod TR, PASP .   Past Surgical History  Procedure Laterality Date  . Rotator cuff repair    . Hip surgery      For OA  . Back surgery     Family History  Problem Relation Age of Onset  . CAD Father     Died of MI at 74  . CAD Brother     s/p CABG  . CAD Brother     Died at 29 of MI  . Diabetes Mother   . Hypertension Mother    Social History  Substance Use Topics  . Smoking status: Former Games developer  . Smokeless tobacco: None     Comment: Smoked for <10 years  . Alcohol Use: No   OB History    No data available     Review of Systems   Constitutional: Negative for fever and diaphoresis.  HENT: Negative for sore throat.   Respiratory: Positive for cough and shortness of breath. Negative for hemoptysis and sputum production.   Cardiovascular: Negative for chest pain and claudication.  Gastrointestinal: Negative for abdominal pain.  Neurological: Negative for headaches.  All other systems reviewed and are negative.     Allergies  Penicillins  Home Medications   Prior to Admission medications   Medication Sig Start Date End Date Taking? Authorizing Provider  albuterol (PROVENTIL HFA;VENTOLIN HFA) 108 (90 Base) MCG/ACT inhaler Inhale 1 puff into the lungs every 6 (six) hours as needed for wheezing or shortness of breath.   Yes Historical Provider, MD  allopurinol (ZYLOPRIM) 300 MG tablet Take 300 mg by mouth daily.   Yes Historical Provider, MD  amLODipine (NORVASC) 5 MG tablet Take 5 mg by mouth at bedtime.    Yes Historical Provider, MD  aspirin EC 81 MG tablet Take 81 mg by mouth daily.   Yes Historical Provider, MD  atenolol (TENORMIN) 100 MG tablet Take 100 mg by mouth at bedtime.    Yes Historical Provider, MD  docusate sodium (COLACE) 100 MG capsule Take  100 mg by mouth daily as needed for mild constipation or moderate constipation.    Yes Historical Provider, MD  furosemide (LASIX) 40 MG tablet Take 2 tablets (80 mg total) by mouth 2 (two) times daily. 12/18/14  Yes Gaspar Garbe, MD  insulin aspart (NOVOLOG) 100 UNIT/ML injection Inject 40 Units into the skin daily with supper.    Yes Historical Provider, MD  insulin detemir (LEVEMIR) 100 UNIT/ML injection Inject 40 Units into the skin at bedtime.    Yes Historical Provider, MD  levothyroxine (SYNTHROID, LEVOTHROID) 25 MCG tablet Take 25 mcg by mouth daily before breakfast.   Yes Historical Provider, MD  mirtazapine (REMERON) 15 MG tablet Take 1 tablet (15 mg total) by mouth at bedtime. 12/18/14  Yes Gaspar Garbe, MD  Multiple Vitamins-Minerals  (PRESERVISION AREDS 2) CAPS Take 1 capsule by mouth daily.   Yes Historical Provider, MD  Multiple Vitamins-Minerals (PRESERVISION AREDS 2) CAPS Take 1 capsule by mouth at bedtime.   Yes Historical Provider, MD  simvastatin (ZOCOR) 80 MG tablet Take 80 mg by mouth at bedtime.    Yes Historical Provider, MD  traMADol (ULTRAM) 50 MG tablet Take 50 mg by mouth at bedtime.    Yes Historical Provider, MD   BP 92/59 mmHg  Pulse 86  Temp(Src) 100.9 F (38.3 C) (Rectal)  Resp 24  SpO2 96% Physical Exam  Constitutional: She appears well-developed and well-nourished. No distress.  HENT:  Head: Normocephalic and atraumatic.  Eyes: Conjunctivae are normal. Right eye exhibits no discharge. Left eye exhibits no discharge.  Neck: Normal range of motion. Neck supple. No JVD present.  Cardiovascular: Normal rate, regular rhythm, normal heart sounds and intact distal pulses.   No murmur heard. Pulmonary/Chest: She is in respiratory distress. She has wheezes. She has rales.  BiPAP  Abdominal: Soft. Bowel sounds are normal. She exhibits no distension and no mass. There is no tenderness. There is no rebound and no guarding.  Musculoskeletal: She exhibits edema (significant bilateral bitting edema).  Neurological: She is alert.  Skin: Skin is warm. No rash noted.  Psychiatric: She has a normal mood and affect.  Nursing note and vitals reviewed.   ED Course  Procedures (including critical care time) Labs Review Labs Reviewed  BASIC METABOLIC PANEL - Abnormal; Notable for the following:    Chloride 92 (*)    Glucose, Bld 366 (*)    BUN 87 (*)    Creatinine, Ser 2.41 (*)    GFR calc non Af Amer 18 (*)    GFR calc Af Amer 20 (*)    Anion gap 17 (*)    All other components within normal limits  CBC - Abnormal; Notable for the following:    WBC 20.7 (*)    RBC 3.54 (*)    Hemoglobin 11.1 (*)    HCT 35.4 (*)    RDW 16.2 (*)    All other components within normal limits  BRAIN NATRIURETIC PEPTIDE -  Abnormal; Notable for the following:    B Natriuretic Peptide 349.2 (*)    All other components within normal limits  PROTIME-INR - Abnormal; Notable for the following:    Prothrombin Time 21.6 (*)    INR 1.89 (*)    All other components within normal limits  TROPONIN I - Abnormal; Notable for the following:    Troponin I 0.06 (*)    All other components within normal limits  I-STAT ARTERIAL BLOOD GAS, ED - Abnormal; Notable for the following:  pO2, Arterial 109.0 (*)    Bicarbonate 27.3 (*)    Acid-Base Excess 3.0 (*)    All other components within normal limits  I-STAT CG4 LACTIC ACID, ED - Abnormal; Notable for the following:    Lactic Acid, Venous 3.66 (*)    All other components within normal limits  CULTURE, BLOOD (ROUTINE X 2)  CULTURE, BLOOD (ROUTINE X 2)  CBC  CREATININE, SERUM  HEMOGLOBIN A1C  TSH  BASIC METABOLIC PANEL  CBC  I-STAT TROPOININ, ED    Imaging Review Dg Chest Portable 1 View  03/19/2016  CLINICAL DATA:  Acute onset of shortness of breath and respiratory distress. Initial encounter. EXAM: PORTABLE CHEST 1 VIEW COMPARISON:  Chest radiograph performed 12/17/2014 FINDINGS: The lungs are well-aerated. Patchy right-sided airspace opacity may reflect asymmetric pulmonary edema or pneumonia. There is no evidence of pleural effusion or pneumothorax. The cardiomediastinal silhouette is borderline normal in size. No acute osseous abnormalities are seen. IMPRESSION: Patchy right-sided airspace opacity may reflect asymmetric pulmonary edema or pneumonia. Electronically Signed   By: Roanna RaiderJeffery  Chang M.D.   On: 03/19/2016 20:52   I have personally reviewed and evaluated these images and lab results as part of my medical decision-making.   EKG Interpretation   Date/Time:  Sunday March 19 2016 21:50:13 EDT Ventricular Rate:  85 PR Interval:  70 QRS Duration: 179 QT Interval:  469 QTC Calculation: 558 R Axis:   -80 Text Interpretation:  Sinus rhythm Short PR interval  Nonspecific IVCD with  LAD LVH with secondary repolarization abnormality No significant change  was found Confirmed by Manus GunningANCOUR  MD, STEPHEN [phone]([id number]) on 03/19/2016 10:28:34  PM      MDM   Final diagnoses:  Healthcare-associated pneumonia  Acute on chronic diastolic congestive heart failure (HCC)   Patient presents with new onset of shortness of breath. Recent admission with A. fib with RVR. Patient arrives on BiPAP after being found hypoxic with room O2 sats of 80%. She states that she is always swollen and does not think she is more swollen today but does not weigh herself daily. She denies any chest pain. Aspirin administered in route. Patient received Solu-Medrol, 2 DuoNeb. She appears to have a CHF exacerbation in addition to a pneumonia on chest x-ray. Lactic acid elevated but given CHF, will hold from aggressive fluid hydration. Antibiotics administered. Doubt PE or dissection or ACS at this time. Spoke with cardiology who will see as an inpatient. Troponin is 0.06. Patient will be admitted for further monitoring of her oxygen, antibiotics, and diuresis. After 80 mg of Lasix IV, patient transitioned from BiPAP to nasal cannula at 2 L. Updated patient. She is in agreement with plan to admit.   Sidney AceAlison Charruf Mikael Debell, MD 03/20/16 [id number]  Glynn OctaveStephen Rancour, MD 03/20/16 1047

## 2016-03-19 NOTE — ED Notes (Signed)
[facility], complains of SOB/Resp Distress, pt has hx of chf and asthma, sob this after noon, used inhaler this afternoon with no relief, wheezing through out, given 2 duonebs, and solumedrol and cpap. 20 g in LAC. 140/120 with ems initially now 140/52 on arrival here.

## 2016-03-19 NOTE — Progress Notes (Signed)
Pharmacy Antibiotic Note  Donna Hurst is a 82 y.o. female admitted on 03/19/2016 with pneumonia.  Pharmacy has been consulted for Vancomycin/Aztreonam dosing. WBC elevated, noted renal dysfunction, other labs reviewed.   Plan: -Vancomycin 1000 mg IV x 1, then 500 mg IV q24h -Aztreonam 500 mg IV q8h -Trend WBC, temp, renal function  -Drug levels as indicated   Temp (24hrs), Avg:100.5 F (38.1 C), Min:100.1 F (37.8 C), Max:100.9 F (38.3 C)   Recent Labs Lab 03/19/16 2013 03/19/16 2248  WBC 20.7*  --   CREATININE 2.41*  --   LATICACIDVEN  --  3.66*    CrCl cannot be calculated (Unknown ideal weight.).    Allergies  Allergen Reactions  . Penicillins Hives and Swelling    Has patient had a PCN reaction causing immediate rash, facial/tongue/throat swelling, SOB or lightheadedness with hypotension: Yes Has patient had a PCN reaction causing severe rash involving mucus membranes or skin necrosis: No Has patient had a PCN reaction that required hospitalization No Has patient had a PCN reaction occurring within the last 10 years: No If all of the above answers are "NO", then may proceed with Cephalosporin use.   Donna Hurst, Donna Hurst 03/19/2016 11:27 PM

## 2016-03-20 ENCOUNTER — Encounter (HOSPITAL_COMMUNITY): Payer: Self-pay | Admitting: General Practice

## 2016-03-20 ENCOUNTER — Inpatient Hospital Stay (HOSPITAL_COMMUNITY): Payer: PPO

## 2016-03-20 ENCOUNTER — Other Ambulatory Visit: Payer: Self-pay | Admitting: *Deleted

## 2016-03-20 DIAGNOSIS — E872 Acidosis: Secondary | ICD-10-CM | POA: Diagnosis not present

## 2016-03-20 DIAGNOSIS — J189 Pneumonia, unspecified organism: Secondary | ICD-10-CM | POA: Diagnosis not present

## 2016-03-20 DIAGNOSIS — I5033 Acute on chronic diastolic (congestive) heart failure: Secondary | ICD-10-CM | POA: Diagnosis not present

## 2016-03-20 LAB — GLUCOSE, CAPILLARY
Glucose-Capillary: 461 mg/dL — ABNORMAL HIGH (ref 65–99)
Glucose-Capillary: 428 mg/dL — ABNORMAL HIGH (ref 65–99)

## 2016-03-20 LAB — CBC
HCT: 33.7 % — ABNORMAL LOW (ref 36.0–46.0)
HEMOGLOBIN: 10.7 g/dL — AB (ref 12.0–15.0)
MCH: 31.2 pg (ref 26.0–34.0)
MCHC: 31.8 g/dL (ref 30.0–36.0)
MCV: 98.3 fL (ref 78.0–100.0)
Platelets: 232 10*3/uL (ref 150–400)
RBC: 3.43 MIL/uL — AB (ref 3.87–5.11)
RDW: 16 % — ABNORMAL HIGH (ref 11.5–15.5)
WBC: 15.4 10*3/uL — ABNORMAL HIGH (ref 4.0–10.5)

## 2016-03-20 LAB — COMPREHENSIVE METABOLIC PANEL
ALBUMIN: 3.2 g/dL — AB (ref 3.5–5.0)
ALK PHOS: 74 U/L (ref 38–126)
ALT: 17 U/L (ref 14–54)
AST: 26 U/L (ref 15–41)
Anion gap: 14 (ref 5–15)
BUN: 87 mg/dL — ABNORMAL HIGH (ref 6–20)
CALCIUM: 8.9 mg/dL (ref 8.9–10.3)
CO2: 24 mmol/L (ref 22–32)
CREATININE: 2.44 mg/dL — AB (ref 0.44–1.00)
Chloride: 96 mmol/L — ABNORMAL LOW (ref 101–111)
GFR calc Af Amer: 20 mL/min — ABNORMAL LOW (ref >60)
GFR calc non Af Amer: 17 mL/min — ABNORMAL LOW (ref >60)
GLUCOSE: 452 mg/dL — AB (ref 65–99)
Potassium: 4.3 mmol/L (ref 3.5–5.1)
SODIUM: 134 mmol/L — AB (ref 135–145)
Total Bilirubin: 1 mg/dL (ref 0.3–1.2)
Total Protein: 6.6 g/dL (ref 6.5–8.1)

## 2016-03-20 LAB — CBG MONITORING, ED
GLUCOSE-CAPILLARY: 504 mg/dL — AB (ref 65–99)
GLUCOSE-CAPILLARY: 536 mg/dL — AB (ref 65–99)
Glucose-Capillary: 527 mg/dL — ABNORMAL HIGH (ref 65–99)

## 2016-03-20 LAB — I-STAT CG4 LACTIC ACID, ED: LACTIC ACID, VENOUS: 3 mmol/L — AB (ref 0.5–2.0)

## 2016-03-20 LAB — TSH: TSH: 1.323 u[IU]/mL (ref 0.350–4.500)

## 2016-03-20 LAB — MRSA PCR SCREENING: MRSA by PCR: NEGATIVE

## 2016-03-20 MED ORDER — INSULIN ASPART 100 UNIT/ML ~~LOC~~ SOLN
20.0000 [IU] | Freq: Once | SUBCUTANEOUS | Status: AC
  Administered 2016-03-20: 20 [IU] via SUBCUTANEOUS
  Filled 2016-03-20: qty 1

## 2016-03-20 MED ORDER — INSULIN ASPART 100 UNIT/ML ~~LOC~~ SOLN
20.0000 [IU] | Freq: Once | SUBCUTANEOUS | Status: AC
  Administered 2016-03-20: 20 [IU] via SUBCUTANEOUS

## 2016-03-20 MED ORDER — FUROSEMIDE 10 MG/ML IJ SOLN
120.0000 mg | Freq: Three times a day (TID) | INTRAMUSCULAR | Status: DC
  Administered 2016-03-20 – 2016-03-21 (×4): 120 mg via INTRAVENOUS
  Filled 2016-03-20 (×7): qty 12

## 2016-03-20 MED ORDER — INSULIN ASPART 100 UNIT/ML ~~LOC~~ SOLN
15.0000 [IU] | Freq: Once | SUBCUTANEOUS | Status: AC
  Administered 2016-03-20: 15 [IU] via SUBCUTANEOUS

## 2016-03-20 MED ORDER — INSULIN ASPART 100 UNIT/ML ~~LOC~~ SOLN: 10.0000 [IU] | Freq: Three times a day (TID) | SUBCUTANEOUS | Status: DC

## 2016-03-20 MED ORDER — INSULIN ASPART 100 UNIT/ML ~~LOC~~ SOLN
0.0000 [IU] | SUBCUTANEOUS | Status: DC
  Administered 2016-03-21 (×4): 11 [IU] via SUBCUTANEOUS
  Administered 2016-03-21: 15 [IU] via SUBCUTANEOUS
  Administered 2016-03-21: 7 [IU] via SUBCUTANEOUS
  Administered 2016-03-22: 4 [IU] via SUBCUTANEOUS

## 2016-03-20 MED ORDER — GI COCKTAIL ~~LOC~~: 30.0000 mL | Freq: Once | ORAL | Status: DC

## 2016-03-20 MED ORDER — ALLOPURINOL 300 MG PO TABS
300.0000 mg | ORAL_TABLET | Freq: Every day | ORAL | Status: DC
  Administered 2016-03-20 – 2016-03-22 (×3): 300 mg via ORAL
  Filled 2016-03-20: qty 3
  Filled 2016-03-20 (×2): qty 1

## 2016-03-20 MED ORDER — INSULIN ASPART 100 UNIT/ML ~~LOC~~ SOLN
24.0000 [IU] | Freq: Once | SUBCUTANEOUS | Status: AC
  Administered 2016-03-20: 24 [IU] via SUBCUTANEOUS
  Filled 2016-03-20: qty 1

## 2016-03-20 MED ORDER — INSULIN DETEMIR 100 UNIT/ML ~~LOC~~ SOLN
40.0000 [IU] | Freq: Every day | SUBCUTANEOUS | Status: DC
  Filled 2016-03-20: qty 0.4

## 2016-03-20 MED ORDER — INSULIN ASPART 100 UNIT/ML ~~LOC~~ SOLN
10.0000 [IU] | Freq: Three times a day (TID) | SUBCUTANEOUS | Status: DC
Start: 2016-03-21 — End: 2016-03-22

## 2016-03-20 MED ORDER — ALBUTEROL SULFATE (2.5 MG/3ML) 0.083% IN NEBU: 2.5000 mg | INHALATION_SOLUTION | RESPIRATORY_TRACT | Status: DC | PRN

## 2016-03-20 MED ORDER — INSULIN DETEMIR 100 UNIT/ML ~~LOC~~ SOLN
40.0000 [IU] | Freq: Every day | SUBCUTANEOUS | Status: DC
  Administered 2016-03-20: 40 [IU] via SUBCUTANEOUS
  Filled 2016-03-20 (×2): qty 0.4

## 2016-03-20 NOTE — Progress Notes (Signed)
Inpatient Diabetes Program Recommendations  AACE/ADA: New Consensus Statement on Inpatient Glycemic Control (2015)  Target Ranges:  Prepandial:   less than 140 mg/dL      Peak postprandial:   less than 180 mg/dL (1-2 hours)      Critically ill patients:  140 - 180 mg/dL   Lab Results  Component Value Date   GLUCAP 527* 03/20/2016    Review of Glycemic Control  Diabetes history: DM 2  Outpatient Diabetes medications: Levemir 40 units, Novolog 40 units Daily at supper Current orders for Inpatient glycemic control: Levemir 20 units, Novolog 20 units Daily at supper, Novolog Resistant q4hrs  Inpatient Diabetes Program Recommendations:  Insulin - Basal: Glucose still elevated 400-500 range after Levemir 20 units given at 0359 this am. Please consider increasing basal insulin to Levemir 35 units Daily, give additional 15 units of Levemir today.  A1c is collected will wait for results for glucose control at home.  Thanks,  Christena DeemShannon Zamari Vea RN, MSN, [facility] Inpatient Diabetes Coordinator Team Pager [phone] (8a-5p)

## 2016-03-20 NOTE — ED Notes (Signed)
Spoke with Dr. Konrad DoloresMerrell, he reports can give patient 20 units one time dose of novolog SQ injection, since CBG 527 and exceed sliding scale range.

## 2016-03-20 NOTE — Care Management Note (Signed)
Case Management Note  Patient Details  Name: Donna Hurst MRN: [id number] Date of Birth: 08/03/1932  Subjective/Objective:                  82 y.o. female presenting with shortness of breath. /Home with spouse.  Action/Plan: Follow for disposition needs.   Expected Discharge Date:  03/23/16               Expected Discharge Plan:  Home w Home Health Services  In-House Referral:     Discharge planning Services  CM Consult  Post Acute Care Choice:    Choice offered to:     DME Arranged:    DME Agency:     HH Arranged:    HH Agency:     Status of Service:  In process, will continue to follow  Medicare Important Message Given:    Date Medicare IM Given:    Medicare IM give by:    Date Additional Medicare IM Given:    Additional Medicare Important Message give by:     If discussed at Long Length of Stay Meetings, dates discussed:    Additional Comments:  Donna Hurst, Donna Hagy, RN 03/20/2016, 10:12 AM

## 2016-03-20 NOTE — Evaluation (Signed)
Clinical/Bedside Swallow Evaluation Patient Details  Name: Donna Hurst MRN: [id number] Date of Birth: 08/06/1932  Today's Date: 03/20/2016 Time: SLP Start Time (ACUTE ONLY): 1552 SLP Stop Time (ACUTE ONLY): 1608 SLP Time Calculation (min) (ACUTE ONLY): 16 min  Past Medical History:  Past Medical History  Diagnosis Date  . Hypertension   . Diabetes mellitus with renal manifestation (HCC)   . CKD (chronic kidney disease), stage IV (HCC)   . Secondary hyperparathyroidism (HCC)   . Anemia of chronic disease   . Obesity   . Dyslipidemia   . Hypothyroidism   . Chronic diastolic CHF (congestive heart failure) (HCC)   . Gout   . Osteoarthritis   . Edema   . Systolic murmur     a. Noted in 2011; 2D Echo 01/2010: poor acoustic quality, normal wall thickness, EF 60-65%, mild-mod TR, PASP 40mmHg.   Past Surgical History:  Past Surgical History  Procedure Laterality Date  . Rotator cuff repair    . Hip surgery      For OA  . Back surgery     HPI:  Donna Hurst is a 82 y.o. female with medical history significant for diastolic HF, DMII admitted for heart failure, possible PNA. Pt family is not present, tells me she was in her usual state of heatlth until yesterday when she started having burning pain in her chest, like a burning pressure. Center of chest, no radiation. No aggravating or alleviating factors. Hasn't had pain like this before. Also developed cough today, not productive. No fevers, chills. She has associated LE edema, but thinks this is chronic, but does not know if she has gained weight recently.    Assessment / Plan / Recommendation Clinical Impression  Pt referred for clinical assessment of swallow function. Pt tolerated all PO consistencies WNL. No s/s aspiration. Pt does not endorse any dysphagia symptoms. Recommend: Continue with regular diet. No further SLP services indicated at this time.    Aspiration Risk  No limitations    Diet Recommendation Regular;Thin liquid    Liquid Administration via: Cup;Straw Medication Administration: Whole meds with liquid Supervision: Patient able to self feed Postural Changes: Seated upright at 90 degrees    Other  Recommendations Oral Care Recommendations: Oral care QID   Follow up Recommendations  None    Frequency and Duration            Prognosis Prognosis for Safe Diet Advancement: Good      Swallow Study   General Date of Onset: 03/20/16 HPI: Donna Hurst is a 82 y.o. female with medical history significant for diastolic HF, DMII admitted for heart failure, possible PNA. Pt family is not present, tells me she was in her usual state of heatlth until yesterday when she started having burning pain in her chest, like a burning pressure. Center of chest, no radiation. No aggravating or alleviating factors. Hasn't had pain like this before. Also developed cough today, not productive. No fevers, chills. She has associated LE edema, but thinks this is chronic, but does not know if she has gained weight recently.  Type of Study: Bedside Swallow Evaluation Previous Swallow Assessment: none Diet Prior to this Study: Regular;Thin liquids Temperature Spikes Noted: No Respiratory Status: Room air History of Recent Intubation: No Behavior/Cognition: Alert;Cooperative;Pleasant mood Oral Cavity Assessment: Within Functional Limits Oral Care Completed by SLP: No Oral Cavity - Dentition: Dentures, top;Dentures, bottom Vision: Functional for self-feeding Self-Feeding Abilities: Able to feed self Patient Positioning: Upright in bed Baseline  Vocal Quality: Normal Volitional Cough: Strong Volitional Swallow: Able to elicit    Oral/Motor/Sensory Function Overall Oral Motor/Sensory Function: Within functional limits   Ice Chips Ice chips: Within functional limits Presentation: Spoon   Thin Liquid Thin Liquid: Within functional limits Presentation: Cup;Straw    Nectar Thick     Honey Thick     Puree Puree: Within  functional limits Presentation: Self Fed;Spoon   Solid   GO   Solid: Within functional limits Presentation: Self Fed    Functional Assessment Tool Used: clinician judgement Functional Limitations: Swallowing Swallow Current Status (W0981): 0 percent impaired, limited or restricted Swallow Goal Status (X9147): 0 percent impaired, limited or restricted Swallow Discharge Status [phone]): 0 percent impaired, limited or restricted   Rocky Crafts MA, CCC-SLP Pager [phone] 03/20/2016,4:16 PM

## 2016-03-20 NOTE — Consult Note (Signed)
   [facility]   03/20/2016  [name] 08-07-1932 [id number]   Came back to speak with [name] at bedside [facility]. She is agreeable and written consent signed. Explained to patient that she will receive post hospital transition of care calls and will be evaluated for monthly home visits. Confirmed Primary Care MD as Tis[place]c.  Confirmed best contact number as [phone]. Explained that [facility] will not interfere or replace services provided by home health if she should have it. She reports she lives with her husband and older son who is autistic. Denies having issues with obtaining medications or with transportation. Discussed primary focus will be CHF disease and symptom management. She also has a history of DM and CKD. Left [facility] packet and contact information at bedside. Made inpatient RNCM aware that patient [facility] discharge.  Will [facility] to be [facility] for disease and symptom management for CHF.    Raiford NobleAtika Hall, MSN-Ed, RN,BSN [facility] Care Management Hospital Liaison [phone]

## 2016-03-20 NOTE — ED Notes (Signed)
Pt CBG 527. Admitting MD paged, awaiting further orders. Pt is eating breakfast, reports no pain. In NAD

## 2016-03-20 NOTE — Consult Note (Signed)
   [facility]   03/20/2016  Lenora Boysnita M [place] 05-May-1932 [id number]   [facility]. Went to bedside. Speech Therapist was at bedside. Will come back at a later time.  Raiford NobleAtika Ceejay Kegley, MSN-Ed, RN,BSN [facility] Care [facility] Hospital Liaison [phone]

## 2016-03-20 NOTE — ED Notes (Signed)
Patient's son Rexanne ManoBucky called to check on mother.  Reassurance given.

## 2016-03-20 NOTE — ED Notes (Signed)
Recheck pt sugar after insulin this AM. CBG 537. MD made paged. Also, pt requesting allopurinol be ordered.

## 2016-03-20 NOTE — Progress Notes (Addendum)
[facility] TEAM 1 - Stepdown/ICU TEAM  Donna Hurst  ZHY:[id number] DOB: Nov 23, 1931 DOA: 03/19/2016 PCP: Gaspar Garbeichard W Tisovec, MD    Brief Narrative:  82 y.o. female with history of diastolic CHF, CKD stage 4, and DM2 who c/o burning pain in her chest w/ a nonproductive cough, and LE edema. In the ED a CXR suggested a R sided infiltrate v/s asymmetric edema.     Subjective: The pt is feeling much better.  She denies current sob, n/v, chest pain, or abdom pain.  She is in good spirits.   Assessment & Plan:  Acute on chronic diastolic heart failure grade II diastolic CHF via TTE 03/15/16 - severe LE edema and JVD on exam - baseline weight appears to be ~87kg - diurese as BP allows - follow Is/Os and daily wgts - no ACEi due to renal failure - no BB due to relative hypotension/acute exacerbation  Filed Weights   03/20/16 1419  Weight: 91.627 kg (202 lb)   Lactic acidosis No sx suggestive of infection - suspect this is simply poor perfusion due to CHF, and poor clearance in setting of CKD - follow w/ diuresis   ?Pneumonia   I am not convinced this pt has an infectious PNA - cont abx for now, but will have low threshold to stop - will d/c vanc if MRSA screen negative   Elevated INR - unclear etiology ?passive hepatic congestion/hepatopathy - recheck in AM - LFTs normal   CKD Stage 4 Followed by Dr. Rich Reiningoldanato - reportedly does not want dialysis should she progress to ESRD - baseline crt appears to be ~2.3  Uncontrolled DM2 Admits to dietary indiscretion - CBG very poorly controlled, w/ recurring values > 500 - cont aggressive SSI - A1c pending   Hx HTN  Gout Quiescent   Obesity - Body mass index is 38.19 kg/(m^2).   DVT prophylaxis: lovenox  Code Status: NO CODE / DNR Family Communication: no family present at time of exam  Disposition Plan: stable for tele bed at this time - diurese - watch renal fxn and BP - PT/OT eval in AM   Consultants:   none  Procedures: none  Antimicrobials:  Aztreonam 6/11 > Vanc 6/11 >  Objective: Blood pressure 128/48, pulse 80, temperature 97.8 F (36.6 C), temperature source Oral, resp. rate 19, height 5\' 1"  (1.549 m), weight 91.627 kg (202 lb), SpO2 98 %.  Intake/Output Summary (Last 24 hours) at 03/20/16 1633 Last data filed at 03/20/16 1109  Gross per 24 hour  Intake      0 ml  Output    601 ml  Net   -601 ml    Examination: General: No acute respiratory distress at rest  Lungs: fine crackles B bases - no wheeze  Cardiovascular: Regular rate and rhythm without murmur - JVD to angle of jaw Abdomen: Nontender, obese, soft, bowel sounds positive, no rebound, no ascites, no appreciable mass Extremities: No significant cyanosis, or clubbing - 3+ pitting edema bilateral lower extremities  CBC:  Recent Labs Lab 03/19/16 2013 03/20/16 0037  WBC 20.7* 15.4*  HGB 11.1* 10.7*  HCT 35.4* 33.7*  MCV 100.0 98.3  PLT 260 232   Basic Metabolic Panel:  Recent Labs Lab 03/19/16 2013 03/20/16 0037  NA 135 134*  K 3.6 4.3  CL 92* 96*  CO2 26 24  GLUCOSE 366* 452*  BUN 87* 87*  CREATININE 2.41* 2.44*  CALCIUM 9.0 8.9   GFR: Estimated Creatinine Clearance: 18.3 mL/min (by C-G  formula based on Cr of 2.44).  Liver Function Tests:  Recent Labs Lab 03/20/16 0037  AST 26  ALT 17  ALKPHOS 74  BILITOT 1.0  PROT 6.6  ALBUMIN 3.2*    Coagulation Profile:  Recent Labs Lab 03/19/16 2013  INR 1.89*    Cardiac Enzymes:  Recent Labs Lab 03/19/16 2013  TROPONINI 0.06*    HbA1C: No results found for: HGBA1C  CBG:  Recent Labs Lab 03/20/16 0403 03/20/16 0721 03/20/16 1108  GLUCAP 504* 527* 536*     Scheduled Meds: . allopurinol  300 mg Oral Daily  . aspirin EC  81 mg Oral Daily  . atenolol  100 mg Oral QHS  . atorvastatin  40 mg Oral q1800  . aztreonam  500 mg Intravenous Q8H  . docusate sodium  100 mg Oral BID  . enoxaparin (LOVENOX) injection  30 mg  Subcutaneous Daily  . furosemide  40 mg Intravenous Daily  . gi cocktail  30 mL Oral Once  . insulin aspart  0-20 Units Subcutaneous Q4H  . insulin aspart  20 Units Subcutaneous Q supper  . insulin detemir  20 Units Subcutaneous QHS  . levothyroxine  25 mcg Oral QAC breakfast  . mirtazapine  15 mg Oral QHS  . traMADol  50 mg Oral QHS  . vancomycin  500 mg Intravenous Q24H     LOS: 1 day   Time spent: 35 minutes   Lonia Blood, MD [facility] Office  [phone] Pager - Text Page per Amion as per below:  On-Call/Text Page:      [email or web address]      password [email or web address]  If 7PM-7AM, please contact night-coverage [email or web address] Password [facility] 03/20/2016, 4:33 PM

## 2016-03-21 ENCOUNTER — Inpatient Hospital Stay (HOSPITAL_COMMUNITY): Payer: PPO

## 2016-03-21 DIAGNOSIS — J189 Pneumonia, unspecified organism: Secondary | ICD-10-CM | POA: Diagnosis not present

## 2016-03-21 DIAGNOSIS — R7881 Bacteremia: Secondary | ICD-10-CM

## 2016-03-21 DIAGNOSIS — N183 Chronic kidney disease, stage 3 unspecified: Secondary | ICD-10-CM | POA: Diagnosis present

## 2016-03-21 DIAGNOSIS — I5033 Acute on chronic diastolic (congestive) heart failure: Secondary | ICD-10-CM | POA: Diagnosis not present

## 2016-03-21 DIAGNOSIS — R0602 Shortness of breath: Secondary | ICD-10-CM | POA: Diagnosis not present

## 2016-03-21 DIAGNOSIS — E1165 Type 2 diabetes mellitus with hyperglycemia: Secondary | ICD-10-CM

## 2016-03-21 DIAGNOSIS — Z794 Long term (current) use of insulin: Secondary | ICD-10-CM

## 2016-03-21 DIAGNOSIS — B955 Unspecified streptococcus as the cause of diseases classified elsewhere: Secondary | ICD-10-CM | POA: Diagnosis present

## 2016-03-21 DIAGNOSIS — I350 Nonrheumatic aortic (valve) stenosis: Secondary | ICD-10-CM | POA: Diagnosis not present

## 2016-03-21 DIAGNOSIS — B954 Other streptococcus as the cause of diseases classified elsewhere: Secondary | ICD-10-CM

## 2016-03-21 DIAGNOSIS — IMO0002 Reserved for concepts with insufficient information to code with codable children: Secondary | ICD-10-CM | POA: Diagnosis present

## 2016-03-21 LAB — CBC
HEMATOCRIT: 30.5 % — AB (ref 36.0–46.0)
HEMOGLOBIN: 9.9 g/dL — AB (ref 12.0–15.0)
MCH: 31.7 pg (ref 26.0–34.0)
MCHC: 32.5 g/dL (ref 30.0–36.0)
MCV: 97.8 fL (ref 78.0–100.0)
Platelets: 241 10*3/uL (ref 150–400)
RBC: 3.12 MIL/uL — ABNORMAL LOW (ref 3.87–5.11)
RDW: 15.6 % — AB (ref 11.5–15.5)
WBC: 20.4 10*3/uL — AB (ref 4.0–10.5)

## 2016-03-21 LAB — BLOOD CULTURE ID PANEL (REFLEXED)
Acinetobacter baumannii: NOT DETECTED
Candida albicans: NOT DETECTED
Candida glabrata: NOT DETECTED
Candida krusei: NOT DETECTED
Candida parapsilosis: NOT DETECTED
Candida tropicalis: NOT DETECTED
Carbapenem resistance: NOT DETECTED
Enterobacter cloacae complex: NOT DETECTED
Enterobacteriaceae species: NOT DETECTED
Enterococcus species: NOT DETECTED
Escherichia coli: NOT DETECTED
Haemophilus influenzae: NOT DETECTED
Klebsiella oxytoca: NOT DETECTED
Klebsiella pneumoniae: NOT DETECTED
Listeria monocytogenes: NOT DETECTED
Methicillin resistance: NOT DETECTED
Neisseria meningitidis: NOT DETECTED
Proteus species: NOT DETECTED
Pseudomonas aeruginosa: NOT DETECTED
Serratia marcescens: NOT DETECTED
Staphylococcus aureus (BCID): NOT DETECTED
Staphylococcus species: NOT DETECTED
Streptococcus agalactiae: NOT DETECTED
Streptococcus pneumoniae: NOT DETECTED
Streptococcus pyogenes: NOT DETECTED
Streptococcus species: DETECTED — AB
Vancomycin resistance: NOT DETECTED

## 2016-03-21 LAB — PROCALCITONIN: Procalcitonin: >1.75 ng/mL

## 2016-03-21 LAB — COMPREHENSIVE METABOLIC PANEL
ALT: 27 U/L (ref 14–54)
ANION GAP: 12 (ref 5–15)
AST: 42 U/L — AB (ref 15–41)
Albumin: 3 g/dL — ABNORMAL LOW (ref 3.5–5.0)
Alkaline Phosphatase: 76 U/L (ref 38–126)
BILIRUBIN TOTAL: 0.8 mg/dL (ref 0.3–1.2)
BUN: 95 mg/dL — ABNORMAL HIGH (ref 6–20)
CO2: 29 mmol/L (ref 22–32)
Calcium: 9.5 mg/dL (ref 8.9–10.3)
Chloride: 96 mmol/L — ABNORMAL LOW (ref 101–111)
Creatinine, Ser: 2.28 mg/dL — ABNORMAL HIGH (ref 0.44–1.00)
GFR calc Af Amer: 22 mL/min — ABNORMAL LOW (ref >60)
GFR, EST NON AFRICAN AMERICAN: 19 mL/min — AB (ref >60)
Glucose, Bld: 260 mg/dL — ABNORMAL HIGH (ref 65–99)
POTASSIUM: 4 mmol/L (ref 3.5–5.1)
Sodium: 137 mmol/L (ref 135–145)
TOTAL PROTEIN: 6.4 g/dL — AB (ref 6.5–8.1)

## 2016-03-21 LAB — GLUCOSE, CAPILLARY
GLUCOSE-CAPILLARY: 257 mg/dL — AB (ref 65–99)
Glucose-Capillary: 236 mg/dL — ABNORMAL HIGH (ref 65–99)
Glucose-Capillary: 262 mg/dL — ABNORMAL HIGH (ref 65–99)
Glucose-Capillary: 272 mg/dL — ABNORMAL HIGH (ref 65–99)
Glucose-Capillary: 288 mg/dL — ABNORMAL HIGH (ref 65–99)
Glucose-Capillary: 343 mg/dL — ABNORMAL HIGH (ref 65–99)

## 2016-03-21 LAB — BASIC METABOLIC PANEL
Anion gap: 10 (ref 5–15)
BUN: 98 mg/dL — ABNORMAL HIGH (ref 6–20)
CALCIUM: 9.1 mg/dL (ref 8.9–10.3)
CHLORIDE: 94 mmol/L — AB (ref 101–111)
CO2: 30 mmol/L (ref 22–32)
CREATININE: 2.04 mg/dL — AB (ref 0.44–1.00)
GFR calc Af Amer: 25 mL/min — ABNORMAL LOW (ref >60)
GFR, EST NON AFRICAN AMERICAN: 22 mL/min — AB (ref >60)
GLUCOSE: 304 mg/dL — AB (ref 65–99)
Potassium: 4.3 mmol/L (ref 3.5–5.1)
SODIUM: 134 mmol/L — AB (ref 135–145)

## 2016-03-21 LAB — URINALYSIS, ROUTINE W REFLEX MICROSCOPIC
Bilirubin Urine: NEGATIVE
GLUCOSE, UA: NEGATIVE mg/dL
HGB URINE DIPSTICK: NEGATIVE
KETONES UR: NEGATIVE mg/dL
Leukocytes, UA: NEGATIVE
Nitrite: NEGATIVE
PROTEIN: NEGATIVE mg/dL
Specific Gravity, Urine: 1.015 (ref 1.005–1.030)
pH: 5 (ref 5.0–8.0)

## 2016-03-21 LAB — HEMOGLOBIN A1C
Hgb A1c MFr Bld: 7.7 % — ABNORMAL HIGH (ref 4.8–5.6)
Mean Plasma Glucose: 174 mg/dL

## 2016-03-21 LAB — PROTIME-INR
INR: 1.18 (ref 0.00–1.49)
PROTHROMBIN TIME: 15.1 s (ref 11.6–15.2)

## 2016-03-21 LAB — MAGNESIUM: Magnesium: 2.4 mg/dL (ref 1.7–2.4)

## 2016-03-21 LAB — STREP PNEUMONIAE URINARY ANTIGEN: Strep Pneumo Urinary Antigen: NEGATIVE

## 2016-03-21 MED ORDER — INSULIN DETEMIR 100 UNIT/ML ~~LOC~~ SOLN
30.0000 [IU] | Freq: Two times a day (BID) | SUBCUTANEOUS | Status: DC
  Administered 2016-03-21 (×2): 30 [IU] via SUBCUTANEOUS
  Filled 2016-03-21 (×4): qty 0.3

## 2016-03-21 NOTE — Consult Note (Signed)
CARDIOLOGY CONSULT NOTE   Patient ID: Donna Hurst MRN: [id number] DOB/AGE: Dec 22, 1931 82 y.o.  Admit date: 03/19/2016  Primary Physician   Gaspar Garbe, MD Primary Cardiologist: Dr. Eden Emms Requesting MD: Dr. Isidoro Donning Reason for Consultation: CHF  HPI: Donna Hurst is a 82 year old female with a past medical history of HTN, DM, HLD, mild to moderate TR, and chronic diastolic CHF.   She presented to the ED on 03/19/16 with chest pain that she describes as burning in the center of her chest that began a few days prior. She initially thought that it was indigestion as the pain only came with lying down, but then she began to experience SOB on exertion and orthopnea. She also noted a new dry cough, and has bilateral lower extremity edema but describes this as chronic. Upon arrival to the ED, chest x-ray showed right-sided opacity consistent with pulmonary edema versus pneumonia. Her BNP was elevated at 349.2. Cardiology was consulted to help manage CHF.  Her last echo was earlier this month. Her LVEF was 60-65%, no regional wall motion abnormalities. She has grade 2 diastolic dysfunction and mild aortic stenosis with a mean gradient of 16 mm Hg.   She is minimally active at home, normally does not feel SOB with activity until recently. 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 She reports some dietary discretions, however denies eating fast food and avoids canned products. She lives with her son, who is a Investment banker, operational at a local nursing home and he does not add salt to the food that he prepares for her.   She is normally able to lay flat for sleep, with one pillow. Only in the last few days has she experienced orthopnea. Denies ever having any PND. Does not have a history of CAD.   She was initially treated for pneumonia however, primary team is not convinced that this patient has infectious pneumonia. She is still currently on antibiotics. According to review of notes her  baseline weight appears to be 87 kg. Her admission weight was 91.6 kg, currently she is 90.5 kg. She has received IV diuresis. She has gotten 3 doses of 120 mg IV furosemide thus far.     Past Medical History  Diagnosis Date  . Hypertension   . Diabetes mellitus with renal manifestation (HCC)   . CKD (chronic kidney disease), stage IV (HCC)   . Secondary hyperparathyroidism (HCC)   . Anemia of chronic disease   . Obesity   . Dyslipidemia   . Hypothyroidism   . Chronic diastolic CHF (congestive heart failure) (HCC)   . Gout   . Osteoarthritis   . Edema   . Systolic murmur     a. Noted in 2011; 2D Echo 01/2010: poor acoustic quality, normal wall thickness, EF 60-65%, mild-mod TR, PASP .  Marland Kitchen Shortness of breath dyspnea      Past Surgical History  Procedure Laterality Date  . Rotator cuff repair    . Hip surgery      For OA  . Back surgery    . Ankle surgery Left     " many years ago "    Allergies  Allergen Reactions  . Penicillins Hives and Swelling    Has patient had a PCN reaction causing immediate rash, facial/tongue/throat swelling, SOB or lightheadedness with hypotension: Yes Has patient had a PCN reaction causing severe rash involving mucus membranes or skin necrosis: No Has patient  had a PCN reaction that required hospitalization No Has patient had a PCN reaction occurring within the last 10 years: No If all of the above answers are "NO", then may proceed with Cephalosporin use.    I have reviewed the patient's current medications . allopurinol  300 mg Oral Daily  . aspirin EC  81 mg Oral Daily  . atorvastatin  40 mg Oral q1800  . aztreonam  500 mg Intravenous Q8H  . docusate sodium  100 mg Oral BID  . enoxaparin (LOVENOX) injection  30 mg Subcutaneous Daily  . furosemide  120 mg Intravenous Q8H  . gi cocktail  30 mL Oral Once  . insulin aspart  0-20 Units Subcutaneous Q4H  . insulin aspart  10 Units Subcutaneous TID WC  . insulin detemir  30 Units  Subcutaneous BID  . levothyroxine  25 mcg Oral QAC breakfast  . mirtazapine  15 mg Oral QHS  . traMADol  50 mg Oral QHS  . vancomycin  500 mg Intravenous Q24H     acetaminophen **OR** acetaminophen, albuterol, docusate sodium, ondansetron **OR** ondansetron (ZOFRAN) IV, polyethylene glycol  Prior to Admission medications   Medication Sig Start Date End Date Taking? Authorizing Provider  albuterol (PROVENTIL HFA;VENTOLIN HFA) 108 (90 Base) MCG/ACT inhaler Inhale 1 puff into the lungs every 6 (six) hours as needed for wheezing or shortness of breath.   Yes Historical Provider, MD  allopurinol (ZYLOPRIM) 300 MG tablet Take 300 mg by mouth daily.   Yes Historical Provider, MD  amLODipine (NORVASC) 5 MG tablet Take 5 mg by mouth at bedtime.    Yes Historical Provider, MD  aspirin EC 81 MG tablet Take 81 mg by mouth daily.   Yes Historical Provider, MD  atenolol (TENORMIN) 100 MG tablet Take 100 mg by mouth at bedtime.    Yes Historical Provider, MD  docusate sodium (COLACE) 100 MG capsule Take 100 mg by mouth daily as needed for mild constipation or moderate constipation.    Yes Historical Provider, MD  furosemide (LASIX) 40 MG tablet Take 2 tablets (80 mg total) by mouth 2 (two) times daily. 12/18/14  Yes Gaspar Garbe, MD  insulin aspart (NOVOLOG) 100 UNIT/ML injection Inject 40 Units into the skin daily with supper.    Yes Historical Provider, MD  insulin detemir (LEVEMIR) 100 UNIT/ML injection Inject 40 Units into the skin at bedtime.    Yes Historical Provider, MD  levothyroxine (SYNTHROID, LEVOTHROID) 25 MCG tablet Take 25 mcg by mouth daily before breakfast.   Yes Historical Provider, MD  mirtazapine (REMERON) 15 MG tablet Take 1 tablet (15 mg total) by mouth at bedtime. 12/18/14  Yes Gaspar Garbe, MD  Multiple Vitamins-Minerals (PRESERVISION AREDS 2) CAPS Take 1 capsule by mouth daily.   Yes Historical Provider, MD  Multiple Vitamins-Minerals (PRESERVISION AREDS 2) CAPS Take 1  capsule by mouth at bedtime.   Yes Historical Provider, MD  simvastatin (ZOCOR) 80 MG tablet Take 80 mg by mouth at bedtime.    Yes Historical Provider, MD  traMADol (ULTRAM) 50 MG tablet Take 50 mg by mouth at bedtime.    Yes Historical Provider, MD     Social History   Social History  . Marital Status: Married    Spouse Name: N/A  . Number of Children: N/A  . Years of Education: N/A   Occupational History  . Retired Engineer, civil (consulting)    Social History Main Topics  . Smoking status: Former Games developer  . Smokeless tobacco: Never Used  Comment: Smoked for <10 years  . Alcohol Use: No  . Drug Use: No  . Sexual Activity: Not on file   Other Topics Concern  . Not on file   Social History Narrative    Family Status  Relation Status Death Age  . Father Deceased   . Mother Deceased   . Brother Deceased   . Maternal Grandfather Deceased   . Maternal Grandmother Deceased   . Paternal Grandfather Deceased   . Paternal Grandmother Deceased    Family History  Problem Relation Age of Onset  . CAD Father     Died of MI at 5251  . CAD Brother     s/p CABG  . CAD Brother     Died at 6741 of MI  . Diabetes Mother   . Hypertension Mother      ROS:  Full 14 point review of systems complete and found to be negative unless listed above.  Physical Exam: Blood pressure 130/63, pulse 66, temperature 98.2 F (36.8 C), temperature source Oral, resp. rate 18, height 5\' 1"  (1.549 m), weight 199 lb 9.6 oz (90.538 kg), SpO2 98 %.  General: Well developed, well nourished,obese female in no acute distress Head: Eyes PERRLA, No xanthomas.   Normocephalic and atraumatic, oropharynx without edema or exudate.  Lungs: CTA, fine crackles in RLL.  Heart: HRRR S1 S2, no rub/gallop, No murmur. pulses are 2+ extrem.   Neck: No carotid bruits. No lymphadenopathy.  No JVD. Abdomen: Bowel sounds present, abdomen soft and non-tender without masses or hernias noted. Msk:  No spine or cva tenderness. No weakness, no  joint deformities or effusions. Extremities: No clubbing or cyanosis. +1 pretibial  edema.  Neuro: Alert and oriented X 3. No focal deficits noted. Psych:  Good affect, responds appropriately Skin: No rashes or lesions noted.  Labs:   Lab Results  Component Value Date   WBC 20.4* 03/21/2016   HGB 9.9* 03/21/2016   HCT 30.5* 03/21/2016   MCV 97.8 03/21/2016   PLT 241 03/21/2016    Recent Labs  03/21/16 0411  INR 1.18    Recent Labs Lab 03/21/16 0411  NA 137  K 4.0  CL 96*  CO2 29  BUN 95*  CREATININE 2.28*  CALCIUM 9.5  PROT 6.4*  BILITOT 0.8  ALKPHOS 76  ALT 27  AST 42*  GLUCOSE 260*  ALBUMIN 3.0*    Recent Labs  03/19/16 2013  TROPONINI 0.06*    Recent Labs  03/19/16 2044  TROPIPOC 0.06   B NATRIURETIC PEPTIDE  Date/Time Value Ref Range Status  03/19/2016 09:12 PM 349.2* 0.0 - 100.0 pg/mL Final  12/15/2014 09:08 PM 1070.9* 0.0 - 100.0 pg/mL Final    Echo: Transthoracic Echocardiography 03/15/16 - Left ventricle: The cavity size was normal. Wall thickness was  increased in a pattern of mild LVH. Systolic function was normal.  The estimated ejection fraction was in the range of 60% to 65%.  Wall motion was normal; there were no regional wall motion  abnormalities. Features are consistent with a pseudonormal left  ventricular filling pattern, with concomitant abnormal relaxation  and increased filling pressure (grade 2 diastolic dysfunction). - Aortic valve: Trileaflet; moderately calcified leaflets. There  was mild stenosis. Mean gradient (S): 16 mm Hg. - Mitral valve: Mildly calcified annulus. Mildly calcified leaflets  . There was mild regurgitation. - Left atrium: The atrium was mildly dilated. - Right ventricle: The cavity size was normal. Systolic function  was normal. - Pulmonary arteries:  PA peak pressure: 41 mm Hg (S). - Inferior vena cava: The vessel was normal in size. The  respirophasic diameter changes were in the normal  range (>= 50%),  consistent with normal central venous pressure.  Impressions:  - Normal LV size with mild LV hypertrophy. EF 60-65%. Moderate  diastolic dysfunction. Normal RV size and systolic function. Mild  aortic stenosis. Mild pulmonary hypertension.   ECG: NSR with LBBB.   Radiology:  X-ray Chest Pa And Lateral  03/20/2016  CLINICAL DATA:  Community acquired pneumonia. EXAM: CHEST  2 VIEW COMPARISON:  Single-view of the chest 06/11/20176 and 12/17/2014. FINDINGS: Patchy airspace disease in the right chest is improved. There small bilateral pleural effusions. Interstitial pulmonary edema appears improved. IMPRESSION: Improved airspace disease on the right. Small bilateral pleural effusions, worse on the right. Improved interstitial pulmonary edema. Electronically Signed   By: Drusilla Kanner M.D.   On: 03/20/2016 09:13   Dg Chest Port 1 View  03/21/2016  CLINICAL DATA:  Shortness of Breath EXAM: PORTABLE CHEST 1 VIEW COMPARISON:  March 20, 2016 FINDINGS: There has been further clearing on the right. There is currently mild atelectasis in the right base. The lungs elsewhere clear. Heart is mildly enlarged with pulmonary vascularity within normal limits. No adenopathy. No bone lesions. IMPRESSION: Mild right base atelectasis. Lungs otherwise clear. Stable cardiac prominence. Electronically Signed   By: Bretta Bang III M.D.   On: 03/21/2016 09:58   Dg Chest Portable 1 View  03/19/2016  CLINICAL DATA:  Acute onset of shortness of breath and respiratory distress. Initial encounter. EXAM: PORTABLE CHEST 1 VIEW COMPARISON:  Chest radiograph performed 12/17/2014 FINDINGS: The lungs are well-aerated. Patchy right-sided airspace opacity may reflect asymmetric pulmonary edema or pneumonia. There is no evidence of pleural effusion or pneumothorax. The cardiomediastinal silhouette is borderline normal in size. No acute osseous abnormalities are seen. IMPRESSION: Patchy right-sided airspace  opacity may reflect asymmetric pulmonary edema or pneumonia. Electronically Signed   By: Roanna Raider M.D.   On: 03/19/2016 20:52    ASSESSMENT AND PLAN:    Principal Problem:   Acute on chronic diastolic heart failure (HCC) Active Problems:   Bacteremia due to Streptococcus   HCAP (healthcare-associated pneumonia)   CKD (chronic kidney disease), stage III   Diabetes mellitus type 2, uncontrolled (HCC)   1. Acute on chronic diastolic CHF: Patient presented with chest pain with lying down, shortness of breath and increased dyspnea on exertion. Her BNP was elevated, and chest x-ray consistent with coronary edema versus pneumonia. Her breathing has much improved with the administration of IV Lasix. She has received 4 doses of 120 mg of IV Lasix. She is negative most 1 L for admission. It doesn't appear that she has responded to IV diuresis as well as we would hope.   Her creatinine is 2.28. Her baseline is 2.5-3. Would reduce or discontinue IV diuresis,consider switching patient to by mouth torsemide for better gut absorption. She is on beta blocker at home, this has been held during admission. I have reviewed telemetry and her HR is mostly in the 60-65 range. Would add back beta blocker. It is likely that her acute decompensation is related to her bacteremia and underlying CKD.   2. HCAP/Bacteremia due to streptococcus: Patient with positive blood cultures. Management per primary team.   3. CKD Stage III: Patient follows with Dr. Abel Presto, she is clear that she does not want dialysis.   4. DM: Presented with elevated CBG's in 400's. Last A1C  was 7.7. She is insulin dependent at home.  5. Elevated troponin: Likely due to CHF. EKG shows new widening QRS consistent with LBBB pattern. This is new to her. She is chest pain free.    Signed: Little Ishikawa, NP 03/21/2016 1:01 PM Pager [phone]  Co-Sign MD  Agree with note by Suzzette Righter NP  Pt of DF PN with preserved EF/ DD and mild AS  by 2D 2 weeks ago, Just saw Dr PN in the office. Admitted with atyp CP, worse when recumbent and mildly increased SOB. Enz neg. BNP only mildly elevated. EKG with new IVCD. She has been diuresed with high dose lasix (CRI), I/O neg 1 L. She has mild periph edema, scattered basilar crackles but no signif JVP. Of note, her WBC ar elevated 20K and she is bacteremic on IV ATBX  With elevated lactic acid levels. ? Source. Would back down on diuretics, add back BB and continue to Rx sepsis. Will follow wit you.  Runell Gess, M.D., FACP, [facility], [facility] [address] [place], [place]  [address]  [phone] 03/21/2016 2:30 PM

## 2016-03-21 NOTE — Progress Notes (Signed)
[facility]                                                                              Patient Demographics  Donna Hurst, is a 82 y.o. female, DOB - 07-26-1932, JXB:[id number]  Admit date - 03/19/2016   Admitting Physician Mir Vergie Living, MD  Outpatient Primary MD for the patient is Gaspar Garbe, MD  Outpatient specialists:   LOS - 2  days    Chief Complaint  Patient presents with  . Shortness of Breath       Brief summary   82 y.o. female with history of diastolic CHF, CKD stage 4, and DM2 Who presented with chest pain, shortness of breath with coughing. Patient reported that she was in her usual state of health until the day before the admission when she started having chest pain with no radiation, coughing but no fevers or chills. She had associated lower extremity edema  but patient reported as chronic. In ED, CXR suggested Rt sided infiltrate versus asymmetric edema.    Assessment & Plan    Principal Problem:   Acute on chronic diastolic heart failure (HCC), mild to moderate aortic stenosis - Patient was placed on IV diuresis, severe lower extremity edema and JVP, - Currently on Lasix IV 120 mg q8 hours (home dose 80 mg oral BID), negative balance of only 941 mL - Weight down from 202-> 199 lbs - 2-D echo on 6/7 had shown EF of 60-65% with grade 2 diastolic dysfunction - Cardiology consulted, patient follows Dr. Eden Emms  Active Problems:   Bacteremia due to StreptococcusLikely from  ?HCAP (healthcare-associated pneumonia)/CAP - Blood cultures positive for Streptococcus, ? Source - Chest x-ray 6/13 shows mild right basilar atelectasis lungs otherwise clear. Chest x-ray on admission 6/11 had shown patchy right-sided airspace opacity - Obtain urine strep antigen, urine Legionella antigen, UA and culture - Repeat blood cultures, pro-calcitonin >1.75 - Continue vancomycin and aztreonam for now    CKD (chronic kidney disease), stage  III-IV - Baseline appears to be around 2.2-2.3, per last creatinine in March 2016 - Continue IV Lasix, follow creatinine trend - Follows Dr. Abel Presto    Diabetes mellitus type 2, uncontrolled (HCC) - CBGs uncontrolled - Not on any steroids. Continue sliding scale insulin, increased meal coverage 10 units TID, Levemir increased to 30 units twice a day - Diabetic coordinator consult  Hypothyroidism Continue Synthroid 25 MCG daily  Code Status:DO NOT RESUSCITATE  DVT Prophylaxis:  Lovenox  Family Communication: Discussed in detail with the patient, all imaging results, lab results explained to the patien  Disposition Plan: Not medically ready, PT evaluation  Time Spent in minutes  25 minutes  Procedures:  Chest x-ray  Consultants:   Cardiology  Antimicrobials:   IV vancomycin 6/12  IV aztreonam 6/12   Medications  Scheduled Meds: . allopurinol  300 mg Oral Daily  . aspirin EC  81 mg Oral Daily  . atorvastatin  40 mg Oral q1800  . aztreonam  500 mg Intravenous Q8H  . docusate sodium  100 mg Oral BID  . enoxaparin (LOVENOX) injection  30 mg Subcutaneous Daily  . furosemide  120 mg Intravenous Q8H  . gi cocktail  30 mL Oral Once  . insulin aspart  0-20 Units Subcutaneous Q4H  . insulin aspart  10 Units Subcutaneous TID WC  . insulin detemir  30 Units Subcutaneous BID  . levothyroxine  25 mcg Oral QAC breakfast  . mirtazapine  15 mg Oral QHS  . traMADol  50 mg Oral QHS  . vancomycin  500 mg Intravenous Q24H   Continuous Infusions:  PRN Meds:.acetaminophen **OR** acetaminophen, albuterol, docusate sodium, ondansetron **OR** ondansetron (ZOFRAN) IV, polyethylene glycol   Antibiotics   Anti-infectives    Start     Dose/Rate Route Frequency Ordered Stop   03/20/16 2200  vancomycin (VANCOCIN) 500 mg in sodium chloride 0.9 % 100 mL IVPB     500 mg 100 mL/hr over 60 Minutes Intravenous Every 24 hours 03/19/16 2332     03/20/16 0600  aztreonam (AZACTAM) 500 mg in  dextrose 5 % 50 mL IVPB     500 mg 100 mL/hr over 30 Minutes Intravenous Every 8 hours 03/19/16 2332     03/19/16 2230  vancomycin (VANCOCIN) IVPB 1000 mg/200 mL premix     1,000 mg 200 mL/hr over 60 Minutes Intravenous  Once 03/19/16 2215 03/20/16 0137   03/19/16 2230  aztreonam (AZACTAM) 2 g in dextrose 5 % 50 mL IVPB     2 g 100 mL/hr over 30 Minutes Intravenous  Once 03/19/16 2224 03/19/16 2320        Subjective:   Donna Hurst was seen and examined today.  Deconditioned, still has shortness of breath. No chest pain. No fevers or chills. Patient denies dizziness, abdominal pain, N/V/D/C, new weakness, numbess, tingling. No acute events overnight.    Objective:   Filed Vitals:   03/20/16 1659 03/20/16 2100 03/21/16 0414 03/21/16 1123  BP:  121/43 130/63   Pulse:  76 66   Temp: 97.7 F (36.5 C) 97.8 F (36.6 C) 97.6 F (36.4 C) 98.2 F (36.8 C)  TempSrc: Oral Oral Oral Oral  Resp:  20 18   Height:      Weight:   90.538 kg (199 lb 9.6 oz)   SpO2:  97% 98%     Intake/Output Summary (Last 24 hours) at 03/21/16 1254 Last data filed at 03/21/16 0839  Gross per 24 hour  Intake    360 ml  Output    700 ml  Net   -340 ml     Wt Readings from Last 3 Encounters:  03/21/16 90.538 kg (199 lb 9.6 oz)  02/10/16 86.818 kg (191 lb 6.4 oz)  08/13/15 91.808 kg (202 lb 6.4 oz)     Exam  General: Alert and oriented x 3, NAD  HEENT:     Neck: Supple, +JVD, no masses  Cardiovascular: S1 S2 auscultated, no rubs, murmurs or gallops. Regular rate and rhythm.  Respiratory:Bibasal crackles   Gastrointestinal: Soft, nontender, nondistended, + bowel sounds  Ext: no cyanosis clubbing, 2+ edema  Neuro: AAOx3, Cr N's II- XII. Strength 5/5 upper and lower extremities bilaterally  Skin: No rashes  Psych: Normal affect and demeanor, alert and oriented x3    Data Reviewed:  I have personally reviewed following labs and imaging studies  Micro Results Recent Results (from the  past 240 hour(s))  Blood culture (routine x 2)     Status: None (Preliminary result)   Collection Time: 03/19/16 10:30 PM  Result Value Ref Range Status   Specimen Description BLOOD RIGHT ANTECUBITAL  Final  Special Requests BOTTLES DRAWN AEROBIC AND ANAEROBIC 5CC   Final   Culture  Setup Time   Final    GRAM POSITIVE COCCI IN CHAINS IN PAIRS AEROBIC BOTTLE ONLY Organism ID to follow CRITICAL RESULT CALLED TO, READ BACK BY AND VERIFIED WITH: JAMES LEDFORD,PHARMD @0405  03/21/16 MKELLY    Culture NO GROWTH < 24 HOURS  Final   Report Status PENDING  Incomplete  Blood Culture ID Panel (Reflexed)     Status: Abnormal   Collection Time: 03/19/16 10:30 PM  Result Value Ref Range Status   Enterococcus species NOT DETECTED NOT DETECTED Final   Vancomycin resistance NOT DETECTED NOT DETECTED Final   Listeria monocytogenes NOT DETECTED NOT DETECTED Final   Staphylococcus species NOT DETECTED NOT DETECTED Final   Staphylococcus aureus NOT DETECTED NOT DETECTED Final   Methicillin resistance NOT DETECTED NOT DETECTED Final   Streptococcus species DETECTED (A) NOT DETECTED Corrected    Comment: CRITICAL RESULT CALLED TO, READ BACK BY AND VERIFIED WITH: JAMES LEDFORD,PHARMD @0405  03/21/16 MKELLY CORRECTED ON 06/13 AT 0404: PREVIOUSLY REPORTED AS DETECTED JAMES LEDFORD,PHARMD @0405  03/21/16 MKELLY    Streptococcus agalactiae NOT DETECTED NOT DETECTED Final   Streptococcus pneumoniae NOT DETECTED NOT DETECTED Final   Streptococcus pyogenes NOT DETECTED NOT DETECTED Final   Acinetobacter baumannii NOT DETECTED NOT DETECTED Final   Enterobacteriaceae species NOT DETECTED NOT DETECTED Final   Enterobacter cloacae complex NOT DETECTED NOT DETECTED Final   Escherichia coli NOT DETECTED NOT DETECTED Final   Klebsiella oxytoca NOT DETECTED NOT DETECTED Final   Klebsiella pneumoniae NOT DETECTED NOT DETECTED Final   Proteus species NOT DETECTED NOT DETECTED Final   Serratia marcescens NOT DETECTED NOT  DETECTED Final   Carbapenem resistance NOT DETECTED NOT DETECTED Final   Haemophilus influenzae NOT DETECTED NOT DETECTED Final   Neisseria meningitidis NOT DETECTED NOT DETECTED Final   Pseudomonas aeruginosa NOT DETECTED NOT DETECTED Final   Candida albicans NOT DETECTED NOT DETECTED Final   Candida glabrata NOT DETECTED NOT DETECTED Final   Candida krusei NOT DETECTED NOT DETECTED Final   Candida parapsilosis NOT DETECTED NOT DETECTED Final   Candida tropicalis NOT DETECTED NOT DETECTED Final  Blood culture (routine x 2)     Status: None (Preliminary result)   Collection Time: 03/19/16 10:38 PM  Result Value Ref Range Status   Specimen Description BLOOD RIGHT FOREARM  Final   Special Requests BOTTLES DRAWN AEROBIC AND ANAEROBIC 5CC   Final   Culture NO GROWTH < 24 HOURS  Final   Report Status PENDING  Incomplete  MRSA PCR Screening     Status: None   Collection Time: 03/20/16  2:29 PM  Result Value Ref Range Status   MRSA by PCR NEGATIVE NEGATIVE Final    Comment:        The GeneXpert MRSA Assay (FDA approved for NASAL specimens only), is one component of a comprehensive MRSA colonization surveillance program. It is not intended to diagnose MRSA infection nor to guide or monitor treatment for MRSA infections.     Radiology Reports X-ray Chest Pa And Lateral  03/20/2016  CLINICAL DATA:  Community acquired pneumonia. EXAM: CHEST  2 VIEW COMPARISON:  Single-view of the chest 06/11/20176 and 12/17/2014. FINDINGS: Patchy airspace disease in the right chest is improved. There small bilateral pleural effusions. Interstitial pulmonary edema appears improved. IMPRESSION: Improved airspace disease on the right. Small bilateral pleural effusions, worse on the right. Improved interstitial pulmonary edema. Electronically Signed   By: Maisie Fus  Dalessio M.D.   On: 03/20/2016 09:13   Dg Chest Port 1 View  03/21/2016  CLINICAL DATA:  Shortness of Breath EXAM: PORTABLE CHEST 1 VIEW COMPARISON:   March 20, 2016 FINDINGS: There has been further clearing on the right. There is currently mild atelectasis in the right base. The lungs elsewhere clear. Heart is mildly enlarged with pulmonary vascularity within normal limits. No adenopathy. No bone lesions. IMPRESSION: Mild right base atelectasis. Lungs otherwise clear. Stable cardiac prominence. Electronically Signed   By: Bretta Bang III M.D.   On: 03/21/2016 09:58   Dg Chest Portable 1 View  03/19/2016  CLINICAL DATA:  Acute onset of shortness of breath and respiratory distress. Initial encounter. EXAM: PORTABLE CHEST 1 VIEW COMPARISON:  Chest radiograph performed 12/17/2014 FINDINGS: The lungs are well-aerated. Patchy right-sided airspace opacity may reflect asymmetric pulmonary edema or pneumonia. There is no evidence of pleural effusion or pneumothorax. The cardiomediastinal silhouette is borderline normal in size. No acute osseous abnormalities are seen. IMPRESSION: Patchy right-sided airspace opacity may reflect asymmetric pulmonary edema or pneumonia. Electronically Signed   By: Roanna Raider M.D.   On: 03/19/2016 20:52    Lab Data:  CBC:  Recent Labs Lab 03/19/16 2013 03/20/16 0037 03/21/16 0411  WBC 20.7* 15.4* 20.4*  HGB 11.1* 10.7* 9.9*  HCT 35.4* 33.7* 30.5*  MCV 100.0 98.3 97.8  PLT 260 232 241   Basic Metabolic Panel:  Recent Labs Lab 03/19/16 2013 03/20/16 0037 03/21/16 0411  NA 135 134* 137  K 3.6 4.3 4.0  CL 92* 96* 96*  CO2 26 24 29   GLUCOSE 366* 452* 260*  BUN 87* 87* 95*  CREATININE 2.41* 2.44* 2.28*  CALCIUM 9.0 8.9 9.5   GFR: Estimated Creatinine Clearance: 19.5 mL/min (by C-G formula based on Cr of 2.28). Liver Function Tests:  Recent Labs Lab 03/20/16 0037 03/21/16 0411  AST 26 42*  ALT 17 27  ALKPHOS 74 76  BILITOT 1.0 0.8  PROT 6.6 6.4*  ALBUMIN 3.2* 3.0*   No results for input(s): LIPASE, AMYLASE in the last 168 hours. No results for input(s): AMMONIA in the last 168  hours. Coagulation Profile:  Recent Labs Lab 03/19/16 2013 03/21/16 0411  INR 1.89* 1.18   Cardiac Enzymes:  Recent Labs Lab 03/19/16 2013  TROPONINI 0.06*   BNP (last 3 results) No results for input(s): PROBNP in the last 8760 hours. HbA1C:  Recent Labs  03/20/16 0037  HGBA1C 7.7*   CBG:  Recent Labs Lab 03/20/16 1956 03/21/16 0008 03/21/16 0412 03/21/16 0740 03/21/16 1121  GLUCAP 428* 343* 272* 257* 236*   Lipid Profile: No results for input(s): CHOL, HDL, LDLCALC, TRIG, CHOLHDL, LDLDIRECT in the last 72 hours. Thyroid Function Tests:  Recent Labs  03/19/16 0037  TSH 1.323   Anemia Panel: No results for input(s): VITAMINB12, FOLATE, FERRITIN, TIBC, IRON, RETICCTPCT in the last 72 hours. Urine analysis:    Component Value Date/Time   COLORURINE YELLOW 12/15/2014 1838   APPEARANCEUR CLOUDY* 12/15/2014 1838   LABSPEC 1.016 12/15/2014 1838   PHURINE 5.0 12/15/2014 1838   GLUCOSEU NEGATIVE 12/15/2014 1838   HGBUR NEGATIVE 12/15/2014 1838   BILIRUBINUR NEGATIVE 12/15/2014 1838   KETONESUR NEGATIVE 12/15/2014 1838   PROTEINUR NEGATIVE 12/15/2014 1838   UROBILINOGEN 0.2 12/15/2014 1838   NITRITE NEGATIVE 12/15/2014 1838   LEUKOCYTESUR NEGATIVE 12/15/2014 Paulo Fruit     RAI,RIPUDEEP M.D. [facility] 03/21/2016, 12:54 PM  Pager: [phone] Between 7am to 7pm - call Pager - [phone]  After 7pm go to [email or web address] - password [email or web address]  Call night coverage person covering after 7pm

## 2016-03-21 NOTE — Care Management Important Message (Signed)
Important Message  Patient Details  Name: Donna Hurst MRN: [id number] Date of Birth: 1932-09-24   Medicare Important Message Given:  Yes    Bernadette HoitShoffner, Allard Lightsey Coleman 03/21/2016, 8:45 AM

## 2016-03-21 NOTE — Progress Notes (Signed)
Pt had 21 bt run of vtach.  Asymptomatic and VSS.  Bjorn Loserhonda, [place] with cardiology notified and orders received to obtain stat BMP and Mag.  Will continue to closely monitor.

## 2016-03-21 NOTE — Progress Notes (Addendum)
Results for [name], Danijah M (MRN [id number]) as of 03/21/2016 13:19  Ref. Range 03/20/2016 19:56 03/21/2016 00:08 03/21/2016 04:12 03/21/2016 07:40 03/21/2016 11:21  Glucose-Capillary Latest Ref Range: 65-99 mg/dL 696428 (H) 295343 (H) 284272 (H) 257 (H) 236 (H)  Received Diabetes Coordinator consult. Spoke with patient about her diabetes and the insulin regimen that she is on at home. States that she has been on insulin for quite some time. She does not sleep much of th night. 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 She states that she usually sleeps until noon, and then doesn't go back to sleep until 4-5 am in the morning.  Her home insulin regimen is Novolog sliding scale at supper at which time her blood sugar is usually in the 300's. Her dosage can be as high as 40 units of Novolog at suppertime.  Levemir 40 units is taken around 10 pm. She states that she does not eat 3 meals a day. States that she does have low blood sugars in the night at times, especially if not eating a snack late at night.   The Novolog RESISTANT scale needs to be [facility] to [facility] since patient is eating. Recommend dividing the Levemir into 2 doses like it is ordered now.  Could change to Lantus BID. Dr. Wylene Simmerisovec had changed her insulin to Levemir just a few months ago. Patient states that Levemir is now $45 a bottle which is better than what it had been.  Patient could use the Lantus co-pay card for $10 a bottle if has insurance. Recommend also that patient take Novolog 10 units with lunch and dinner at home. Could send home on Lantus or Levemir 20-25 units BID.  Will continue to monitor blood sugars while in the hospital. Smith MinceKendra Jatorian Renault RN BSN CDE

## 2016-03-21 NOTE — Evaluation (Signed)
Physical Therapy Evaluation Patient Details Name: Donna Hurst Jeffreys MRN: [id number] DOB: January 18, 1932 Today's Date: 03/21/2016   History of Present Illness  Donna Hurst Dinunzio is a 82 y.o. female with medical history significant for diastolic HF, DMII admitted for heart failure, possible PNA. Pt family is not present, tells me she was in her usual state of heatlth until yesterday when she started having burning pain in her chest, like a burning pressure. Center of chest, no radiation. No aggravating or alleviating factors. Hasn't had pain like this before. Also developed cough, not productive. No fevers, chills. She has associated LE edema, but thinks this is chronic, but does not know if she has gained weight recently.   Clinical Impression  Pt admitted with above diagnosis. Pt currently with functional limitations due to the deficits listed below (see PT Problem List).  Pt will benefit from skilled PT to increase their independence and safety with mobility to allow discharge to the venue listed below. Pt is moving well overall, but primarily limited by respiratory status with wheezing at end of gait.  During gait o2 sat 90-94% on RA with 1-2/4 dyspnea. Will have close to 24 hour A at home with supportive husband.  No equipment or therapy needs post acute care.      Follow Up Recommendations No PT follow up;Supervision for mobility/OOB    Equipment Recommendations  None recommended by PT    Recommendations for Other Services       Precautions / Restrictions Precautions Precaution Comments: monitor o2 sats      Mobility  Bed Mobility Overal bed mobility: Needs Assistance Bed Mobility: Supine to Sit     Supine to sit: Min assist     General bed mobility comments: Pt with mechanical bed a thome, but husband has attached sheet to foot board for her to pull up on, so she used PT's hand as leverage.  Transfers Overall transfer level: Needs assistance Equipment used: 4-wheeled walker Transfers:  Sit to/from Stand Sit to Stand: Min guard         General transfer comment: min/guard for steadying  Ambulation/Gait Ambulation/Gait assistance: Supervision Ambulation Distance (Feet): 150 Feet Assistive device: 4-wheeled walker Gait Pattern/deviations: Step-through pattern Gait velocity: decreased   General Gait Details: o2 90-94% during gait with 1-2/4 dyspnea with cues for pursed lip breathing.  Upon sitting, she did become very wheezy, with o2 sats  remaining in 90's.  Breathing had settled upon exit and nursing informed.  Stairs            Wheelchair Mobility    Modified Rankin (Stroke Patients Only)       Balance Overall balance assessment: Needs assistance Sitting-balance support: Feet supported Sitting balance-Leahy Scale: Good       Standing balance-Leahy Scale: Poor Standing balance comment: requires UE support                             Pertinent Vitals/Pain Pain Assessment: No/denies pain    Home Living Family/patient expects to be discharged to:: Private residence Living Arrangements: Spouse/significant other Available Help at Discharge: Family;Available 24 hours/day Type of Home: House Home Access: Stairs to enter Entrance Stairs-Rails: Right;Left;Can reach both Entrance Stairs-Number of Steps: 3 Home Layout: Multi-level;Able to live on main level with bedroom/bathroom Home Equipment: Dan HumphreysWalker - 2 wheels;Walker - 4 wheels;Wheelchair - manual;Cane - single point;Grab bars - toilet      Prior Function Level of Independence: Independent with assistive device(s)  Comments: Amb with rollator for community distances and RW for househeold and uses cane from car and into house     Hand Dominance   Dominant Hand: Right    Extremity/Trunk Assessment   Upper Extremity Assessment: Overall WFL for tasks assessed;Generalized weakness           Lower Extremity Assessment: Overall WFL for tasks assessed;Generalized  weakness      Cervical / Trunk Assessment: Normal  Communication   Communication: No difficulties  Cognition Arousal/Alertness: Awake/alert Behavior During Therapy: WFL for tasks assessed/performed Overall Cognitive Status: Within Functional Limits for tasks assessed                      General Comments      Exercises        Assessment/Plan    PT Assessment Patient needs continued PT services  PT Diagnosis Difficulty walking;Generalized weakness   PT Problem List Decreased strength;Decreased activity tolerance;Decreased balance;Cardiopulmonary status limiting activity  PT Treatment Interventions DME instruction;Gait training;Stair training;Functional mobility training;Therapeutic activities;Balance training   PT Goals (Current goals can be found in the Care Plan section) Acute Rehab PT Goals Patient Stated Goal: go home PT Goal Formulation: With patient Time For Goal Achievement: 03/28/16 Potential to Achieve Goals: Good    Frequency Min 3X/week   Barriers to discharge        Co-evaluation               End of Session Equipment Utilized During Treatment: Gait belt Activity Tolerance: Treatment limited secondary to medical complications (Comment) (wheezing at end) Patient left: in chair;with call bell/phone within reach Nurse Communication: Mobility status         Time: 4098-1191 PT Time Calculation (min) (ACUTE ONLY): 30 min   Charges:   PT Evaluation $PT Eval Low Complexity: 1 Procedure PT Treatments $Gait Training: 8-22 mins   PT G Codes:        Aneshia Jacquet LUBECK 03/21/2016, 11:36 AM

## 2016-03-22 ENCOUNTER — Other Ambulatory Visit (HOSPITAL_COMMUNITY): Payer: PPO

## 2016-03-22 DIAGNOSIS — E872 Acidosis: Secondary | ICD-10-CM | POA: Diagnosis not present

## 2016-03-22 DIAGNOSIS — I447 Left bundle-branch block, unspecified: Secondary | ICD-10-CM | POA: Diagnosis not present

## 2016-03-22 DIAGNOSIS — Z87891 Personal history of nicotine dependence: Secondary | ICD-10-CM | POA: Diagnosis not present

## 2016-03-22 DIAGNOSIS — I5033 Acute on chronic diastolic (congestive) heart failure: Secondary | ICD-10-CM | POA: Insufficient documentation

## 2016-03-22 DIAGNOSIS — N184 Chronic kidney disease, stage 4 (severe): Secondary | ICD-10-CM | POA: Diagnosis not present

## 2016-03-22 DIAGNOSIS — I130 Hypertensive heart and chronic kidney disease with heart failure and stage 1 through stage 4 chronic kidney disease, or unspecified chronic kidney disease: Secondary | ICD-10-CM | POA: Diagnosis not present

## 2016-03-22 DIAGNOSIS — J189 Pneumonia, unspecified organism: Secondary | ICD-10-CM | POA: Insufficient documentation

## 2016-03-22 DIAGNOSIS — J154 Pneumonia due to other streptococci: Secondary | ICD-10-CM | POA: Diagnosis not present

## 2016-03-22 DIAGNOSIS — E1165 Type 2 diabetes mellitus with hyperglycemia: Secondary | ICD-10-CM | POA: Diagnosis not present

## 2016-03-22 DIAGNOSIS — I350 Nonrheumatic aortic (valve) stenosis: Secondary | ICD-10-CM | POA: Diagnosis not present

## 2016-03-22 DIAGNOSIS — M109 Gout, unspecified: Secondary | ICD-10-CM | POA: Diagnosis not present

## 2016-03-22 DIAGNOSIS — E1122 Type 2 diabetes mellitus with diabetic chronic kidney disease: Secondary | ICD-10-CM | POA: Diagnosis not present

## 2016-03-22 DIAGNOSIS — R7881 Bacteremia: Secondary | ICD-10-CM | POA: Diagnosis not present

## 2016-03-22 DIAGNOSIS — N183 Chronic kidney disease, stage 3 (moderate): Secondary | ICD-10-CM | POA: Diagnosis not present

## 2016-03-22 DIAGNOSIS — I272 Other secondary pulmonary hypertension: Secondary | ICD-10-CM | POA: Diagnosis not present

## 2016-03-22 LAB — URINE CULTURE: Culture: NO GROWTH

## 2016-03-22 LAB — CBC
HEMATOCRIT: 31.8 % — AB (ref 36.0–46.0)
Hemoglobin: 10.1 g/dL — ABNORMAL LOW (ref 12.0–15.0)
MCH: 30.7 pg (ref 26.0–34.0)
MCHC: 31.8 g/dL (ref 30.0–36.0)
MCV: 96.7 fL (ref 78.0–100.0)
PLATELETS: 250 10*3/uL (ref 150–400)
RBC: 3.29 MIL/uL — AB (ref 3.87–5.11)
RDW: 15.6 % — ABNORMAL HIGH (ref 11.5–15.5)
WBC: 18 10*3/uL — AB (ref 4.0–10.5)

## 2016-03-22 LAB — LEGIONELLA PNEUMOPHILA SEROGP 1 UR AG: L. PNEUMOPHILA SEROGP 1 UR AG: NEGATIVE

## 2016-03-22 LAB — BASIC METABOLIC PANEL
Anion gap: 11 (ref 5–15)
BUN: 95 mg/dL — ABNORMAL HIGH (ref 6–20)
CHLORIDE: 98 mmol/L — AB (ref 101–111)
CO2: 28 mmol/L (ref 22–32)
CREATININE: 1.93 mg/dL — AB (ref 0.44–1.00)
Calcium: 9 mg/dL (ref 8.9–10.3)
GFR, EST AFRICAN AMERICAN: 27 mL/min — AB (ref >60)
GFR, EST NON AFRICAN AMERICAN: 23 mL/min — AB (ref >60)
Glucose, Bld: 95 mg/dL (ref 65–99)
POTASSIUM: 3.4 mmol/L — AB (ref 3.5–5.1)
SODIUM: 137 mmol/L (ref 135–145)

## 2016-03-22 LAB — GLUCOSE, CAPILLARY
GLUCOSE-CAPILLARY: 105 mg/dL — AB (ref 65–99)
GLUCOSE-CAPILLARY: 65 mg/dL (ref 65–99)
GLUCOSE-CAPILLARY: 161 mg/dL — AB (ref 65–99)
GLUCOSE-CAPILLARY: 136 mg/dL — AB (ref 65–99)
Glucose-Capillary: 151 mg/dL — ABNORMAL HIGH (ref 65–99)
Glucose-Capillary: 195 mg/dL — ABNORMAL HIGH (ref 65–99)
Glucose-Capillary: 98 mg/dL (ref 65–99)

## 2016-03-22 MED ORDER — POTASSIUM CHLORIDE CRYS ER 20 MEQ PO TBCR
40.0000 meq | EXTENDED_RELEASE_TABLET | Freq: Once | ORAL | Status: AC
Start: 2016-03-22 — End: 2016-03-22
  Administered 2016-03-22: 40 meq via ORAL
  Filled 2016-03-22: qty 2

## 2016-03-22 MED ORDER — ALLOPURINOL 100 MG PO TABS
200.0000 mg | ORAL_TABLET | Freq: Every day | ORAL | Status: DC
  Administered 2016-03-23: 200 mg via ORAL
  Filled 2016-03-22 (×2): qty 2

## 2016-03-22 MED ORDER — INSULIN ASPART 100 UNIT/ML ~~LOC~~ SOLN
0.0000 [IU] | Freq: Three times a day (TID) | SUBCUTANEOUS | Status: DC
  Administered 2016-03-22 – 2016-03-23 (×2): 3 [IU] via SUBCUTANEOUS

## 2016-03-22 MED ORDER — INSULIN ASPART 100 UNIT/ML ~~LOC~~ SOLN: 0.0000 [IU] | Freq: Every day | SUBCUTANEOUS | Status: DC

## 2016-03-22 MED ORDER — IPRATROPIUM-ALBUTEROL 0.5-2.5 (3) MG/3ML IN SOLN
3.0000 mL | Freq: Three times a day (TID) | RESPIRATORY_TRACT | Status: DC
  Administered 2016-03-22 – 2016-03-23 (×3): 3 mL via RESPIRATORY_TRACT
  Filled 2016-03-22 (×3): qty 3

## 2016-03-22 MED ORDER — FUROSEMIDE 10 MG/ML IJ SOLN
80.0000 mg | Freq: Three times a day (TID) | INTRAMUSCULAR | Status: DC
  Administered 2016-03-22 – 2016-03-23 (×3): 80 mg via INTRAVENOUS
  Filled 2016-03-22 (×3): qty 8

## 2016-03-22 MED ORDER — IPRATROPIUM-ALBUTEROL 0.5-2.5 (3) MG/3ML IN SOLN
3.0000 mL | RESPIRATORY_TRACT | Status: DC
  Administered 2016-03-22: 3 mL via RESPIRATORY_TRACT
  Filled 2016-03-22: qty 3

## 2016-03-22 MED ORDER — INSULIN ASPART 100 UNIT/ML ~~LOC~~ SOLN
3.0000 [IU] | Freq: Three times a day (TID) | SUBCUTANEOUS | Status: DC
  Administered 2016-03-22: 3 [IU] via SUBCUTANEOUS

## 2016-03-22 MED ORDER — INSULIN DETEMIR 100 UNIT/ML ~~LOC~~ SOLN
20.0000 [IU] | Freq: Two times a day (BID) | SUBCUTANEOUS | Status: DC
  Administered 2016-03-22 – 2016-03-23 (×3): 20 [IU] via SUBCUTANEOUS
  Filled 2016-03-22 (×4): qty 0.2

## 2016-03-22 MED ORDER — INSULIN DETEMIR 100 UNIT/ML ~~LOC~~ SOLN: 25.0000 [IU] | Freq: Two times a day (BID) | SUBCUTANEOUS | Status: DC

## 2016-03-22 MED ORDER — ALUM & MAG HYDROXIDE-SIMETH 200-200-20 MG/5ML PO SUSP
15.0000 mL | Freq: Once | ORAL | Status: AC
  Administered 2016-03-22: 15 mL via ORAL
  Filled 2016-03-22: qty 30

## 2016-03-22 NOTE — Progress Notes (Signed)
Pharmacy Antibiotic Note  Donna Hurst is a 82 y.o. female admitted on 03/19/2016 with pneumonia, on D#4 vancomycin and aztreonam. afev, wbc up 15.4 >>20.4 > 18K, CKD, scr 2.44 >> 1.93, est. Crcl ~ 20 ml/min. Pt with penicillin allergy (hives, swelling), no previous record of cephalosporin use.   Aztreonam 6/12 >>  Vanc 6/12 >>  6/11 blood x 2 - 1/2 strep  6/13 blood x 2  6/13 urine  Plan: -Vancomycin 500 mg IV q24h -Aztreonam 500 mg IV q8h -monitor renal function and f/u cultures -Consider switching to levaquin 750 mg Q 48 hrs? -Check vancomycin trough if continues vanc tomorrow.   Temp (24hrs), Avg:97.6 F (36.4 C), Min:97.5 F (36.4 C), Max:97.7 F (36.5 C)   Recent Labs Lab 03/19/16 2013 03/19/16 2248 03/20/16 0037 03/20/16 0105 03/21/16 0411 03/21/16 1842 03/22/16 0445  WBC 20.7*  --  15.4*  --  20.4*  --  18.0*  CREATININE 2.41*  --  2.44*  --  2.28* 2.04* 1.93*  LATICACIDVEN  --  3.66*  --  3.00*  --   --   --     Estimated Creatinine Clearance: 23 mL/min (by C-G formula based on Cr of 1.93).    Allergies  Allergen Reactions  . Penicillins Hives and Swelling    Has patient had a PCN reaction causing immediate rash, facial/tongue/throat swelling, SOB or lightheadedness with hypotension: Yes Has patient had a PCN reaction causing severe rash involving mucus membranes or skin necrosis: No Has patient had a PCN reaction that required hospitalization No Has patient had a PCN reaction occurring within the last 10 years: No If all of the above answers are "NO", then may proceed with Cephalosporin use.   Donna Hurst, Donna Hurst 03/22/2016 1:34 PM

## 2016-03-22 NOTE — Progress Notes (Signed)
Pt refused both PM doses of Lasix IVPG. Pt states that she wants to wait to talk to the doctor this AM.

## 2016-03-22 NOTE — Progress Notes (Signed)
[facility]                                                                              Patient Demographics  Donna Hurst, is a 82 y.o. female, DOB - Mar 23, 1932, ZOX:[id number]  Admit date - 03/19/2016   Admitting Physician Mir Vergie Living, MD  Outpatient Primary MD for the patient is Gaspar Garbe, MD  Outpatient specialists:   LOS - 3  days    Chief Complaint  Patient presents with  . Shortness of Breath       Brief summary   82 y.o. female with history of diastolic CHF, CKD stage 4, and DM2 Who presented with chest pain, shortness of breath with coughing. Patient reported that she was in her usual state of health until the day before the admission when she started having chest pain with no radiation, coughing but no fevers or chills. She had associated lower extremity edema  but patient reported as chronic. In ED, CXR suggested Rt sided infiltrate versus asymmetric edema.    Assessment & Plan    Principal Problem:   Acute on chronic diastolic heart failure (HCC), mild to moderate aortic stenosis - Patient was placed on IV diuresis, severe lower extremity edema and JVP, - Patient was on Lasix IV 120 mg q8 hours (home dose 80 mg oral BID), negative balance of only 831cc as she refused overnight doses. Patient wants to decrease lasix, for now decreased to 80mg  IV q8hrs, patient agreeable to take it.  - Weight down from 202-> 199 lbs - 2-D echo on 6/7 had shown EF of 60-65% with grade 2 diastolic dysfunction - Cardiology following   Active Problems:   Bacteremia due to StreptococcusLikely from  ?HCAP (healthcare-associated pneumonia)/CAP - Blood cultures positive for Streptococcus, ? Source - Chest x-ray 6/13 shows mild right basilar atelectasis lungs otherwise clear. Chest x-ray on admission 6/11 had shown patchy right-sided airspace opacity - urine strept Ag negative,  urine Legionella antigen pending. UA negative - Repeat blood cultures,  pro-calcitonin >1.75 - Continue vancomycin and aztreonam for now, awaiting sensitivities      CKD (chronic kidney disease), stage III-IV - Baseline appears to be around 2.2-2.3, per last creatinine in March 2016 - Continue IV Lasix, follow creatinine trend - Follows Dr. Abel Presto    Diabetes mellitus type 2, uncontrolled (HCC) - CBGs uncontrolled, now lower this am - Deceased insulin regimen   Hypothyroidism Continue Synthroid 25 MCG daily  Code Status:DO NOT RESUSCITATE  DVT Prophylaxis:  Lovenox  Family Communication: Discussed in detail with the patient, all imaging results, lab results explained to the patient  Disposition Plan: Not medically ready, PT evaluation  Time Spent in minutes  25 minutes  Procedures:  Chest x-ray  Consultants:   Cardiology  Antimicrobials:   IV vancomycin 6/12  IV aztreonam 6/12   Medications  Scheduled Meds: . allopurinol  300 mg Oral Daily  . aspirin EC  81 mg Oral Daily  . atorvastatin  40 mg Oral q1800  . aztreonam  500 mg Intravenous Q8H  . docusate sodium  100 mg Oral BID  . enoxaparin (LOVENOX) injection  30 mg Subcutaneous Daily  . furosemide  80 mg Intravenous Q8H  . gi cocktail  30 mL Oral Once  . insulin aspart  0-15 Units Subcutaneous TID WC  . insulin aspart  0-5 Units Subcutaneous QHS  . insulin aspart  3 Units Subcutaneous TID WC  . insulin detemir  20 Units Subcutaneous BID  . ipratropium-albuterol  3 mL Nebulization TID  . levothyroxine  25 mcg Oral QAC breakfast  . mirtazapine  15 mg Oral QHS  . traMADol  50 mg Oral QHS  . vancomycin  500 mg Intravenous Q24H   Continuous Infusions:  PRN Meds:.acetaminophen **OR** acetaminophen, albuterol, docusate sodium, ondansetron **OR** ondansetron (ZOFRAN) IV, polyethylene glycol   Antibiotics   Anti-infectives    Start     Dose/Rate Route Frequency Ordered Stop   03/20/16 2200  vancomycin (VANCOCIN) 500 mg in sodium chloride 0.9 % 100 mL IVPB     500 mg 100 mL/hr  over 60 Minutes Intravenous Every 24 hours 03/19/16 2332     03/20/16 0600  aztreonam (AZACTAM) 500 mg in dextrose 5 % 50 mL IVPB     500 mg 100 mL/hr over 30 Minutes Intravenous Every 8 hours 03/19/16 2332     03/19/16 2230  vancomycin (VANCOCIN) IVPB 1000 mg/200 mL premix     1,000 mg 200 mL/hr over 60 Minutes Intravenous  Once 03/19/16 2215 03/20/16 0137   03/19/16 2230  aztreonam (AZACTAM) 2 g in dextrose 5 % 50 mL IVPB     2 g 100 mL/hr over 30 Minutes Intravenous  Once 03/19/16 2224 03/19/16 2320        Subjective:   Trinnity Breunig was seen and examined today.  Still has shortness of breath but refused lasix overnight. No chest pain. No fevers or chills. Patient denies dizziness, abdominal pain, N/V/D/C, new weakness, numbess, tingling. No acute events overnight.    Objective:   Filed Vitals:   03/21/16 1816 03/21/16 2030 03/22/16 0500 03/22/16 0851  BP: 127/36 136/38 121/49   Pulse:  64 80 69  Temp:  97.7 F (36.5 C) 97.5 F (36.4 C)   TempSrc:  Oral Oral   Resp:  Height:      Weight:      SpO2:  97% 94% 95%    Intake/Output Summary (Last 24 hours) at 03/22/16 1209 Last data filed at 03/22/16 0900  Gross per 24 hour  Intake    960 ml  Output    850 ml  Net    110 ml     Wt Readings from Last 3 Encounters:  03/21/16 90.538 kg (199 lb 9.6 oz)  02/10/16 86.818 kg (191 lb 6.4 oz)  08/13/15 91.808 kg (202 lb 6.4 oz)     Exam  General: Alert and oriented x 3, NAD  HEENT:     Neck: Supple, +JVD, no masses  Cardiovascular: S1 S2 clear, RRR  Respiratory:Bibasal crackles   Gastrointestinal: Soft, nontender, nondistended, + bowel sounds  Ext: no cyanosis clubbing, 2+ edema  Neuro: no new deficits  Skin: No rashes  Psych: Normal affect and demeanor, alert and oriented x3    Data Reviewed:  I have personally reviewed following labs and imaging studies  Micro Results Recent Results (from the past 240 hour(s))  Blood culture (routine x 2)      Status: None (Preliminary result)   Collection Time: 03/19/16 10:30 PM  Result Value Ref Range Status   Specimen Description BLOOD RIGHT ANTECUBITAL  Final   Special Requests BOTTLES DRAWN AEROBIC AND ANAEROBIC 5CC   Final   Culture  Setup Time   Final    GRAM POSITIVE COCCI IN CHAINS IN PAIRS AEROBIC BOTTLE ONLY Organism ID to follow CRITICAL RESULT CALLED TO, READ BACK BY AND VERIFIED WITH: JAMES LEDFORD,PHARMD @0405  03/21/16 MKELLY    Culture GRAM POSITIVE COCCI  Final   Report Status PENDING  Incomplete  Blood Culture ID Panel (Reflexed)     Status: Abnormal   Collection Time: 03/19/16 10:30 PM  Result Value Ref Range Status   Enterococcus species NOT DETECTED NOT DETECTED Final   Vancomycin resistance NOT DETECTED NOT DETECTED Final   Listeria monocytogenes NOT DETECTED NOT DETECTED Final   Staphylococcus species NOT DETECTED NOT DETECTED Final   Staphylococcus aureus NOT DETECTED NOT DETECTED Final   Methicillin resistance NOT DETECTED NOT DETECTED Final   Streptococcus species DETECTED (A) NOT DETECTED Corrected    Comment: CRITICAL RESULT CALLED TO, READ BACK BY AND VERIFIED WITH: JAMES LEDFORD,PHARMD @0405  03/21/16 MKELLY CORRECTED ON 06/13 AT 0404: PREVIOUSLY REPORTED AS DETECTED JAMES LEDFORD,PHARMD @0405  03/21/16 MKELLY    Streptococcus agalactiae NOT DETECTED NOT DETECTED Final   Streptococcus pneumoniae NOT DETECTED NOT DETECTED Final   Streptococcus pyogenes NOT DETECTED NOT DETECTED Final   Acinetobacter baumannii NOT DETECTED NOT DETECTED Final   Enterobacteriaceae species NOT DETECTED NOT DETECTED Final   Enterobacter cloacae complex NOT DETECTED NOT DETECTED Final   Escherichia coli NOT DETECTED NOT DETECTED Final   Klebsiella oxytoca NOT DETECTED NOT DETECTED Final   Klebsiella pneumoniae NOT DETECTED NOT DETECTED Final   Proteus species NOT DETECTED NOT DETECTED Final   Serratia marcescens NOT DETECTED NOT DETECTED Final   Carbapenem resistance NOT  DETECTED NOT DETECTED Final   Haemophilus influenzae NOT DETECTED NOT DETECTED Final   Neisseria meningitidis NOT DETECTED NOT DETECTED Final   Pseudomonas aeruginosa NOT DETECTED NOT DETECTED Final   Candida albicans NOT DETECTED NOT DETECTED Final   Candida glabrata NOT DETECTED NOT DETECTED Final   Candida krusei NOT DETECTED NOT DETECTED Final   Candida parapsilosis NOT DETECTED NOT DETECTED Final   Candida tropicalis NOT DETECTED NOT DETECTED Final  Blood culture (routine x 2)     Status: None (Preliminary result)   Collection Time: 03/19/16 10:38 PM  Result Value Ref Range Status   Specimen Description BLOOD RIGHT FOREARM  Final   Special Requests BOTTLES DRAWN AEROBIC AND ANAEROBIC 5CC   Final   Culture NO GROWTH 2 DAYS  Final   Report Status PENDING  Incomplete  MRSA PCR Screening     Status: None   Collection Time: 03/20/16  2:29 PM  Result Value Ref Range Status   MRSA by PCR NEGATIVE NEGATIVE Final    Comment:        The GeneXpert MRSA Assay (FDA approved for NASAL specimens only), is one component of a comprehensive MRSA colonization surveillance program. It is not intended to diagnose MRSA infection nor to guide or monitor treatment for MRSA infections.   Urine culture     Status: None   Collection Time: 03/21/16  1:02 PM  Result Value Ref Range Status   Specimen Description URINE, CLEAN CATCH  Final   Special Requests NONE  Final   Culture NO GROWTH  Final   Report Status 03/22/2016 FINAL  Final    Radiology Reports X-ray Chest Pa And Lateral  03/20/2016  CLINICAL DATA:  Community acquired pneumonia. EXAM: CHEST  2 VIEW COMPARISON:  Single-view of the chest 06/11/20176 and 12/17/2014. FINDINGS: Patchy airspace disease in the right chest is improved. There small bilateral pleural effusions. Interstitial pulmonary edema appears improved. IMPRESSION: Improved airspace disease on the right. Small bilateral pleural effusions, worse on the right. Improved  interstitial pulmonary edema. Electronically Signed   By: Drusilla Kannerhomas  Dalessio M.D.   On: 03/20/2016 09:13   Dg Chest Port 1 View  03/21/2016  CLINICAL DATA:  Shortness of Breath EXAM: PORTABLE CHEST 1 VIEW COMPARISON:  March 20, 2016 FINDINGS: There has been further clearing on the right. There is currently mild atelectasis in the right base. The lungs elsewhere clear. Heart is mildly enlarged with pulmonary vascularity within normal limits. No adenopathy. No bone lesions. IMPRESSION: Mild right base atelectasis. Lungs otherwise clear. Stable cardiac prominence. Electronically Signed   By: Bretta BangWilliam  Woodruff III M.D.   On: 03/21/2016 09:58   Dg Chest Portable 1 View  03/19/2016  CLINICAL DATA:  Acute onset of shortness of breath and respiratory distress. Initial encounter. EXAM: PORTABLE CHEST 1 VIEW COMPARISON:  Chest radiograph performed 12/17/2014 FINDINGS: The lungs are well-aerated. Patchy right-sided airspace opacity may reflect asymmetric pulmonary edema or pneumonia. There is no evidence of pleural effusion or pneumothorax. The cardiomediastinal silhouette is borderline normal in size. No acute osseous abnormalities are seen. IMPRESSION: Patchy right-sided airspace opacity may reflect asymmetric pulmonary edema or pneumonia. Electronically Signed   By: Roanna RaiderJeffery  Chang M.D.   On: 03/19/2016 20:52    Lab Data:  CBC:  Recent Labs Lab 03/19/16 2013 03/20/16 0037 03/21/16 0411 03/22/16 0445  WBC 20.7* 15.4* 20.4* 18.0*  HGB 11.1* 10.7* 9.9* 10.1*  HCT 35.4* 33.7* 30.5* 31.8*  MCV 100.0 98.3 97.8 96.7  PLT 260 232 241 250   Basic Metabolic Panel:  Recent Labs Lab 03/19/16 2013 03/20/16 0037 03/21/16 0411 03/21/16 1842 03/22/16 0445  NA 135 134* 137 134* 137  K 3.6 4.3 4.0 4.3 3.4*  CL 92* 96* 96* 94* 98*  CO2 26 24 29 30 28   GLUCOSE 366* 452* 260* 304* 95  BUN 87* 87* 95* 98* 95*  CREATININE 2.41* 2.44* 2.28* 2.04* 1.93*  CALCIUM 9.0 8.9 9.5 9.1 9.0  MG  --   --   --  2.4  --     GFR: Estimated Creatinine Clearance: 23 mL/min (by C-G formula based on Cr of 1.93). Liver Function Tests:  Recent Labs Lab 03/20/16 0037 03/21/16 0411  AST 26 42*  ALT 17 27  ALKPHOS 74 76  BILITOT 1.0 0.8  PROT 6.6 6.4*  ALBUMIN 3.2* 3.0*   No results for input(s): LIPASE, AMYLASE in the last 168 hours. No results for input(s): AMMONIA in the last 168 hours. Coagulation Profile:  Recent Labs Lab 03/19/16 2013 03/21/16 0411  INR 1.89* 1.18   Cardiac Enzymes:  Recent Labs Lab 03/19/16 2013  TROPONINI 0.06*   BNP (last 3 results) No results for input(s): PROBNP in the last 8760 hours. HbA1C:  Recent Labs  03/20/16 0037  HGBA1C 7.7*   CBG:  Recent Labs Lab 03/22/16 0009 03/22/16 0417 03/22/16 0732 03/22/16 0938 03/22/16 1118  GLUCAP 195* 105* 65 161* 151*   Lipid Profile: No results for input(s): CHOL, HDL, LDLCALC, TRIG, CHOLHDL, LDLDIRECT in the last 72 hours. Thyroid Function Tests: No results for input(s): TSH, T4TOTAL, FREET4, T3FREE, THYROIDAB in the last 72 hours. Anemia Panel: No results for input(s): VITAMINB12, FOLATE, FERRITIN, TIBC, IRON, RETICCTPCT in the last 72 hours. Urine analysis:    Component  Value Date/Time   COLORURINE YELLOW 03/21/2016 1302   APPEARANCEUR CLEAR 03/21/2016 1302   LABSPEC 1.015 03/21/2016 1302   PHURINE 5.0 03/21/2016 1302   GLUCOSEU NEGATIVE 03/21/2016 1302   HGBUR NEGATIVE 03/21/2016 1302   BILIRUBINUR NEGATIVE 03/21/2016 1302   KETONESUR NEGATIVE 03/21/2016 1302   PROTEINUR NEGATIVE 03/21/2016 1302   UROBILINOGEN 0.2 12/15/2014 1838   NITRITE NEGATIVE 03/21/2016 1302   LEUKOCYTESUR NEGATIVE 03/21/2016 1302     Psalm Arman M.D. [facility] 03/22/2016, 12:09 PM  Pager: [phone] Between 7am to 7pm - call Pager - [phone]  After 7pm go to [email or web address] - password [email or web address]  Call night coverage person covering after 7pm

## 2016-03-22 NOTE — Plan of Care (Signed)
Problem: Safety: Goal: Ability to remain free from injury will improve Outcome: Completed/Met Date Met:  03/22/16 Pt aware to use call button or dial RN or tech phone number when she needs assistance with bathroom. Bed in lowest position, bed alarm on.

## 2016-03-22 NOTE — Progress Notes (Signed)
Patient Name:  Donna Hurst, DOB: [birth year], MRN: [id number] Primary Doctor: Gaspar Garbe, MD Primary Cardiologist:   Date: 03/22/2016   SUBJECTIVE:  The patient is comfortable lying flat in bed today.   Past Medical History  Diagnosis Date  . Hypertension   . Diabetes mellitus with renal manifestation (HCC)   . CKD (chronic kidney disease), stage IV (HCC)   . Secondary hyperparathyroidism (HCC)   . Anemia of chronic disease   . Obesity   . Dyslipidemia   . Hypothyroidism   . Chronic diastolic CHF (congestive heart failure) (HCC)   . Gout   . Osteoarthritis   . Edema   . Systolic murmur     a. Noted in 2011; 2D Echo 01/2010: poor acoustic quality, normal wall thickness, EF 60-65%, mild-mod TR, PASP .  Marland Kitchen Shortness of breath dyspnea    Filed Vitals:   03/21/16 1816 03/21/16 2030 03/22/16 0500 03/22/16 0851  BP: 127/36 136/38 121/49   Pulse:  64 80 69  Temp:  97.7 F (36.5 C) 97.5 F (36.4 C)   TempSrc:  Oral Oral   Resp:  Height:      Weight:      SpO2:  97% 94% 95%    Intake/Output Summary (Last 24 hours) at 03/22/16 1103 Last data filed at 03/22/16 0900  Gross per 24 hour  Intake    960 ml  Output    850 ml  Net    110 ml   Filed Weights   03/20/16 1419 03/21/16 0414  Weight: 202 lb (91.627 kg) 199 lb 9.6 oz (90.538 kg)     LABS: Basic Metabolic Panel:  Recent Labs  78/29/56 1842 03/22/16 0445  NA 134* 137  K 4.3 3.4*  CL 94* 98*  CO2 30 28  GLUCOSE 304* 95  BUN 98* 95*  CREATININE 2.04* 1.93*  CALCIUM 9.1 9.0  MG 2.4  --    Liver Function Tests:  Recent Labs  03/20/16 0037 03/21/16 0411  AST 26 42*  ALT 17 27  ALKPHOS 74 76  BILITOT 1.0 0.8  PROT 6.6 6.4*  ALBUMIN 3.2* 3.0*   No results for input(s): LIPASE, AMYLASE in the last 72 hours. CBC:  Recent Labs  03/21/16 0411 03/22/16 0445  WBC 20.4* 18.0*  HGB 9.9* 10.1*  HCT 30.5* 31.8*  MCV 97.8 96.7  PLT 241 250   Cardiac Enzymes:  Recent  Labs  03/19/16 2013  TROPONINI 0.06*   BNP: Invalid input(s): POCBNP D-Dimer: No results for input(s): DDIMER in the last 72 hours. Thyroid Function Tests: No results for input(s): TSH, T4TOTAL, T3FREE, THYROIDAB in the last 72 hours.  Invalid input(s): FREET3  RADIOLOGY: X-ray Chest Pa And Lateral  03/20/2016  CLINICAL DATA:  Community acquired pneumonia. EXAM: CHEST  2 VIEW COMPARISON:  Single-view of the chest 06/11/20176 and 12/17/2014. FINDINGS: Patchy airspace disease in the right chest is improved. There small bilateral pleural effusions. Interstitial pulmonary edema appears improved. IMPRESSION: Improved airspace disease on the right. Small bilateral pleural effusions, worse on the right. Improved interstitial pulmonary edema. Electronically Signed   By: Drusilla Kanner M.D.   On: 03/20/2016 09:13   Dg Chest Port 1 View  03/21/2016  CLINICAL DATA:  Shortness of Breath EXAM: PORTABLE CHEST 1 VIEW COMPARISON:  March 20, 2016 FINDINGS: There has been further clearing on the right. There is currently mild atelectasis in the right base. The lungs elsewhere clear. Heart  is mildly enlarged with pulmonary vascularity within normal limits. No adenopathy. No bone lesions. IMPRESSION: Mild right base atelectasis. Lungs otherwise clear. Stable cardiac prominence. Electronically Signed   By: Bretta BangWilliam  Woodruff III M.D.   On: 03/21/2016 09:58   Dg Chest Portable 1 View  03/19/2016  CLINICAL DATA:  Acute onset of shortness of breath and respiratory distress. Initial encounter. EXAM: PORTABLE CHEST 1 VIEW COMPARISON:  Chest radiograph performed 12/17/2014 FINDINGS: The lungs are well-aerated. Patchy right-sided airspace opacity may reflect asymmetric pulmonary edema or pneumonia. There is no evidence of pleural effusion or pneumothorax. The cardiomediastinal silhouette is borderline normal in size. No acute osseous abnormalities are seen. IMPRESSION: Patchy right-sided airspace opacity may reflect  asymmetric pulmonary edema or pneumonia. Electronically Signed   By: Roanna RaiderJeffery  Chang M.D.   On: 03/19/2016 20:52    PHYSICAL EXAM   patient is oriented to person time and place. Affect is normal. Family members in the room. Lungs reveal scattered rhonchi. Cardiac exam reveals her systolic murmur of aortic valve disease. Abdomen is soft. The patient has significant peripheral edema.   TELEMETRY: I personally reviewed telemetry March 22, 2016. There is normal sinus rhythm. There is one run of wide complex tachycardia with approximately 15 beats. This may be ventricular tachycardia. However there was mention that the patient has intermittent left bundle-branch block. I'm not completely sure of the rhythm. She does have good left ventricular function. Potassium is mildly decreased and will need to be treated.   ASSESSMENT AND PLAN:    Acute on chronic diastolic heart failure (HCC)    Diuretic had been held yesterday for concern of changing renal function. Her creatinine is slightly better. She has significant edema. With her sepsis syndrome, it is possible that she may not diuresis well yet. She has been put back on her Lasix IV. Careful attention to her renal function will be important.    Bacteremia due to Streptococcus      This is being treated by the primary team.    HCAP (healthcare-associated pneumonia)     This is being treated by the primary team.    CKD (chronic kidney disease), stage III    Plan to continue to make decisions concerning Lasix based on her overall volume status and renal function. She will be receiving Lasix today.     Diabetes mellitus type 2, uncontrolled (HCC)    Wide complex tachycardia  /  intermittent left bundle branch block     I noted above that the patient had wide complex tachycardia under my telemetry description. She has good LV function. Plan to treat her potassium and watch her rhythm.   Hypokalemia  Willa RoughJeffrey Lashica Hannay 03/22/2016 11:03 AM

## 2016-03-22 NOTE — Plan of Care (Signed)
Problem: Activity: Goal: Capacity to carry out activities will improve Outcome: Progressing Pt still gets SOB when using the BSC. Pt still on 2L O2.

## 2016-03-23 DIAGNOSIS — N183 Chronic kidney disease, stage 3 (moderate): Secondary | ICD-10-CM | POA: Diagnosis not present

## 2016-03-23 DIAGNOSIS — I350 Nonrheumatic aortic (valve) stenosis: Secondary | ICD-10-CM

## 2016-03-23 DIAGNOSIS — J189 Pneumonia, unspecified organism: Secondary | ICD-10-CM | POA: Diagnosis not present

## 2016-03-23 DIAGNOSIS — I5033 Acute on chronic diastolic (congestive) heart failure: Secondary | ICD-10-CM | POA: Diagnosis not present

## 2016-03-23 DIAGNOSIS — R7881 Bacteremia: Secondary | ICD-10-CM | POA: Diagnosis not present

## 2016-03-23 LAB — GLUCOSE, CAPILLARY
GLUCOSE-CAPILLARY: 155 mg/dL — AB (ref 65–99)
Glucose-Capillary: 89 mg/dL (ref 65–99)

## 2016-03-23 LAB — CULTURE, BLOOD (ROUTINE X 2)

## 2016-03-23 LAB — BASIC METABOLIC PANEL
Anion gap: 9 (ref 5–15)
BUN: 93 mg/dL — AB (ref 6–20)
CHLORIDE: 98 mmol/L — AB (ref 101–111)
CO2: 30 mmol/L (ref 22–32)
Calcium: 8.7 mg/dL — ABNORMAL LOW (ref 8.9–10.3)
Creatinine, Ser: 1.88 mg/dL — ABNORMAL HIGH (ref 0.44–1.00)
GFR calc Af Amer: 28 mL/min — ABNORMAL LOW (ref >60)
GFR calc non Af Amer: 24 mL/min — ABNORMAL LOW (ref >60)
Glucose, Bld: 127 mg/dL — ABNORMAL HIGH (ref 65–99)
POTASSIUM: 4.2 mmol/L (ref 3.5–5.1)
Sodium: 137 mmol/L (ref 135–145)

## 2016-03-23 LAB — CBC
HEMATOCRIT: 28.2 % — AB (ref 36.0–46.0)
Hemoglobin: 9 g/dL — ABNORMAL LOW (ref 12.0–15.0)
MCH: 30.9 pg (ref 26.0–34.0)
MCHC: 31.9 g/dL (ref 30.0–36.0)
MCV: 96.9 fL (ref 78.0–100.0)
Platelets: 220 10*3/uL (ref 150–400)
RBC: 2.91 MIL/uL — ABNORMAL LOW (ref 3.87–5.11)
RDW: 15.9 % — AB (ref 11.5–15.5)
WBC: 10.8 10*3/uL — AB (ref 4.0–10.5)

## 2016-03-23 LAB — HEMOGLOBIN A1C
Hgb A1c MFr Bld: 7.8 % — ABNORMAL HIGH (ref 4.8–5.6)
MEAN PLASMA GLUCOSE: 177 mg/dL

## 2016-03-23 LAB — PROCALCITONIN: Procalcitonin: 0.96 ng/mL

## 2016-03-23 MED ORDER — INSULIN ASPART 100 UNIT/ML ~~LOC~~ SOLN: SUBCUTANEOUS | Status: DC

## 2016-03-23 MED ORDER — LEVOFLOXACIN 750 MG PO TABS: 750.0000 mg | ORAL_TABLET | ORAL | Status: DC

## 2016-03-23 MED ORDER — FUROSEMIDE 80 MG PO TABS: 80.0000 mg | ORAL_TABLET | Freq: Two times a day (BID) | ORAL | Status: DC

## 2016-03-23 MED ORDER — CARVEDILOL 3.125 MG PO TABS: 3.1250 mg | ORAL_TABLET | Freq: Two times a day (BID) | ORAL | Status: AC

## 2016-03-23 MED ORDER — GI COCKTAIL ~~LOC~~
30.0000 mL | Freq: Three times a day (TID) | ORAL | Status: DC | PRN
  Administered 2016-03-23: 30 mL via ORAL
  Filled 2016-03-23: qty 30

## 2016-03-23 MED ORDER — PANTOPRAZOLE SODIUM 40 MG PO TBEC: 40.0000 mg | DELAYED_RELEASE_TABLET | Freq: Every day | ORAL | Status: DC

## 2016-03-23 MED ORDER — LEVOFLOXACIN 750 MG PO TABS
750.0000 mg | ORAL_TABLET | ORAL | Status: DC
  Administered 2016-03-23: 750 mg via ORAL
  Filled 2016-03-23: qty 1

## 2016-03-23 MED ORDER — PANTOPRAZOLE SODIUM 40 MG PO TBEC
40.0000 mg | DELAYED_RELEASE_TABLET | Freq: Every day | ORAL | Status: DC
  Administered 2016-03-23: 40 mg via ORAL
  Filled 2016-03-23: qty 1

## 2016-03-23 MED ORDER — ALLOPURINOL 100 MG PO TABS: 200.0000 mg | ORAL_TABLET | Freq: Every day | ORAL | Status: AC

## 2016-03-23 MED ORDER — CARVEDILOL 3.125 MG PO TABS: 3.1250 mg | ORAL_TABLET | Freq: Two times a day (BID) | ORAL | Status: DC

## 2016-03-23 MED ORDER — INSULIN ASPART 100 UNIT/ML ~~LOC~~ SOLN: 10.0000 [IU] | Freq: Two times a day (BID) | SUBCUTANEOUS | Status: AC

## 2016-03-23 MED ORDER — INSULIN DETEMIR 100 UNIT/ML ~~LOC~~ SOLN: 20.0000 [IU] | Freq: Two times a day (BID) | SUBCUTANEOUS | Status: AC

## 2016-03-23 MED ORDER — INSULIN DETEMIR 100 UNIT/ML ~~LOC~~ SOLN: 20.0000 [IU] | Freq: Two times a day (BID) | SUBCUTANEOUS | Status: DC

## 2016-03-23 NOTE — Progress Notes (Signed)
Subjective:  No longer has recumbent CP   Objective:  Temp:  [97.8 F (36.6 C)-98.3 F (36.8 C)] 97.8 F (36.6 C) (06/15 0247) Pulse Rate:  [64-77] 66 (06/15 0247) Resp:  [16-21] 21 (06/14 2204) BP: (125-139)/(33-59) 139/33 mmHg (06/15 0247) SpO2:  [88 %-100 %] 99 % (06/15 0247) Weight:  [202 lb 4.8 oz (91.763 kg)] 202 lb 4.8 oz (91.763 kg) (06/15 0245) Weight change:   Intake/Output from previous day: 06/14 0701 - 06/15 0700 In: 1200 [P.O.:1200] Out: 500 [Urine:500]  Intake/Output from this shift:    Physical Exam: General appearance: alert and no distress Neck: no adenopathy, no carotid bruit, no JVD, supple, symmetrical, trachea midline and thyroid not enlarged, symmetric, no tenderness/mass/nodules Lungs: scattered crackles left base Heart: regular rate and rhythm, S1, S2 normal, no murmur, click, rub or gallop Extremities: 1-2+ chronic BLE edema  Lab Results: Results for orders placed or performed during the hospital encounter of 03/19/16 (from the past 48 hour(s))  Procalcitonin - Baseline     Status: None   Collection Time: 03/21/16  9:34 AM  Result Value Ref Range   Procalcitonin >1.75 ng/mL  Glucose, capillary     Status: Abnormal   Collection Time: 03/21/16 11:21 AM  Result Value Ref Range   Glucose-Capillary 236 (H) 65 - 99 mg/dL  Culture, blood (routine x 2)     Status: None (Preliminary result)   Collection Time: 03/21/16 11:30 AM  Result Value Ref Range   Specimen Description BLOOD RIGHT ANTECUBITAL    Special Requests IN PEDIATRIC BOTTLE 3CC    Culture NO GROWTH 1 DAY    Report Status PENDING   Culture, blood (routine x 2)     Status: None (Preliminary result)   Collection Time: 03/21/16 11:34 AM  Result Value Ref Range   Specimen Description BLOOD RIGHT FOREARM    Special Requests IN PEDIATRIC BOTTLE 2CC    Culture NO GROWTH 1 DAY    Report Status PENDING   Urinalysis, Routine w reflex microscopic (not at [facility])     Status: None   Collection Time: 03/21/16  1:02 PM  Result Value Ref Range   Color, Urine YELLOW YELLOW   APPearance CLEAR CLEAR   Specific Gravity, Urine 1.015 1.005 - 1.030   pH 5.0 5.0 - 8.0   Glucose, UA NEGATIVE NEGATIVE mg/dL   Hgb urine dipstick NEGATIVE NEGATIVE   Bilirubin Urine NEGATIVE NEGATIVE   Ketones, ur NEGATIVE NEGATIVE mg/dL   Protein, ur NEGATIVE NEGATIVE mg/dL   Nitrite NEGATIVE NEGATIVE   Leukocytes, UA NEGATIVE NEGATIVE    Comment: MICROSCOPIC NOT DONE ON URINES WITH NEGATIVE PROTEIN, BLOOD, LEUKOCYTES, NITRITE, OR GLUCOSE <1000 mg/dL.  Legionella Pneumophila Serogp 1 Ur Ag     Status: None   Collection Time: 03/21/16  1:02 PM  Result Value Ref Range   L. pneumophila Serogp 1 Ur Ag Negative Negative    Comment: (NOTE) Presumptive negative for L. pneumophila serogroup 1 antigen in urine, suggesting no recent or current infection. Legionnaires' disease cannot be ruled out since other serogroups and species may also cause disease. Performed At: [facility] [place], [place] [id number] Lindon Romp MD [id number]    Source of Sample URINE, CLEAN CATCH   Strep pneumoniae urinary antigen     Status: None   Collection Time: 03/21/16  1:02 PM  Result Value Ref Range   Strep Pneumo Urinary Antigen NEGATIVE NEGATIVE    Comment:  Infection due to S. pneumoniae cannot be absolutely ruled out since the antigen present may be below the detection limit of the test.   Urine culture     Status: None   Collection Time: 03/21/16  1:02 PM  Result Value Ref Range   Specimen Description URINE, CLEAN CATCH    Special Requests NONE    Culture NO GROWTH    Report Status 03/22/2016 FINAL   Glucose, capillary     Status: Abnormal   Collection Time: 03/21/16  4:29 PM  Result Value Ref Range   Glucose-Capillary 262 (H) 65 - 99 mg/dL  Magnesium     Status: None   Collection Time: 03/21/16  6:42 PM  Result Value Ref Range   Magnesium 2.4 1.7 - 2.4 mg/dL    Basic metabolic panel     Status: Abnormal   Collection Time: 03/21/16  6:42 PM  Result Value Ref Range   Sodium 134 (L) 135 - 145 mmol/L   Potassium 4.3 3.5 - 5.1 mmol/L   Chloride 94 (L) 101 - 111 mmol/L   CO2 30 22 - 32 mmol/L   Glucose, Bld 304 (H) 65 - 99 mg/dL   BUN 98 (H) 6 - 20 mg/dL   Creatinine, Ser 2.04 (H) 0.44 - 1.00 mg/dL   Calcium 9.1 8.9 - 10.3 mg/dL   GFR calc non Af Amer 22 (L) >60 mL/min   GFR calc Af Amer 25 (L) >60 mL/min    Comment: (NOTE) The eGFR has been calculated using the CKD EPI equation. This calculation has not been validated in all clinical situations. eGFR's persistently <60 mL/min signify possible Chronic Kidney Disease.    Anion gap 10 5 - 15  Glucose, capillary     Status: Abnormal   Collection Time: 03/21/16  8:09 PM  Result Value Ref Range   Glucose-Capillary 288 (H) 65 - 99 mg/dL  Glucose, capillary     Status: Abnormal   Collection Time: 03/22/16 12:09 AM  Result Value Ref Range   Glucose-Capillary 195 (H) 65 - 99 mg/dL  Glucose, capillary     Status: Abnormal   Collection Time: 03/22/16  4:17 AM  Result Value Ref Range   Glucose-Capillary 105 (H) 65 - 99 mg/dL  Basic metabolic panel     Status: Abnormal   Collection Time: 03/22/16  4:45 AM  Result Value Ref Range   Sodium 137 135 - 145 mmol/L   Potassium 3.4 (L) 3.5 - 5.1 mmol/L    Comment: DELTA CHECK NOTED NO VISIBLE HEMOLYSIS    Chloride 98 (L) 101 - 111 mmol/L   CO2 28 22 - 32 mmol/L   Glucose, Bld 95 65 - 99 mg/dL   BUN 95 (H) 6 - 20 mg/dL   Creatinine, Ser 1.93 (H) 0.44 - 1.00 mg/dL   Calcium 9.0 8.9 - 10.3 mg/dL   GFR calc non Af Amer 23 (L) >60 mL/min   GFR calc Af Amer 27 (L) >60 mL/min    Comment: (NOTE) The eGFR has been calculated using the CKD EPI equation. This calculation has not been validated in all clinical situations. eGFR's persistently <60 mL/min signify possible Chronic Kidney Disease.    Anion gap 11 5 - 15  CBC     Status: Abnormal   Collection  Time: 03/22/16  4:45 AM  Result Value Ref Range   WBC 18.0 (H) 4.0 - 10.5 K/uL   RBC 3.29 (L) 3.87 - 5.11 MIL/uL   Hemoglobin 10.1 (L) 12.0 -  15.0 g/dL   HCT 31.8 (L) 36.0 - 46.0 %   MCV 96.7 78.0 - 100.0 fL   MCH 30.7 26.0 - 34.0 pg   MCHC 31.8 30.0 - 36.0 g/dL   RDW 15.6 (H) 11.5 - 15.5 %   Platelets 250 150 - 400 K/uL  Hemoglobin A1c     Status: Abnormal   Collection Time: 03/22/16  4:45 AM  Result Value Ref Range   Hgb A1c MFr Bld 7.8 (H) 4.8 - 5.6 %    Comment: (NOTE)         Pre-diabetes: 5.7 - 6.4         Diabetes: >6.4         Glycemic control for adults with diabetes: <7.0    Mean Plasma Glucose 177 mg/dL    Comment: (NOTE) Performed At: [facility] [place], [place] [id number] Lindon Romp MD BJ:[id number]   Glucose, capillary     Status: None   Collection Time: 03/22/16  7:32 AM  Result Value Ref Range   Glucose-Capillary 65 65 - 99 mg/dL  Glucose, capillary     Status: Abnormal   Collection Time: 03/22/16  9:38 AM  Result Value Ref Range   Glucose-Capillary 161 (H) 65 - 99 mg/dL  Glucose, capillary     Status: Abnormal   Collection Time: 03/22/16 11:18 AM  Result Value Ref Range   Glucose-Capillary 151 (H) 65 - 99 mg/dL  Glucose, capillary     Status: None   Collection Time: 03/22/16  4:39 PM  Result Value Ref Range   Glucose-Capillary 98 65 - 99 mg/dL  Glucose, capillary     Status: Abnormal   Collection Time: 03/22/16  7:30 PM  Result Value Ref Range   Glucose-Capillary 136 (H) 65 - 99 mg/dL  Basic metabolic panel     Status: Abnormal   Collection Time: 03/23/16  4:48 AM  Result Value Ref Range   Sodium 137 135 - 145 mmol/L   Potassium 4.2 3.5 - 5.1 mmol/L    Comment: DELTA CHECK NOTED   Chloride 98 (L) 101 - 111 mmol/L   CO2 30 22 - 32 mmol/L   Glucose, Bld 127 (H) 65 - 99 mg/dL   BUN 93 (H) 6 - 20 mg/dL   Creatinine, Ser 1.88 (H) 0.44 - 1.00 mg/dL   Calcium 8.7 (L) 8.9 - 10.3 mg/dL   GFR calc non Af Amer 24 (L) >60  mL/min   GFR calc Af Amer 28 (L) >60 mL/min    Comment: (NOTE) The eGFR has been calculated using the CKD EPI equation. This calculation has not been validated in all clinical situations. eGFR's persistently <60 mL/min signify possible Chronic Kidney Disease.    Anion gap 9 5 - 15  CBC     Status: Abnormal   Collection Time: 03/23/16  4:48 AM  Result Value Ref Range   WBC 10.8 (H) 4.0 - 10.5 K/uL   RBC 2.91 (L) 3.87 - 5.11 MIL/uL   Hemoglobin 9.0 (L) 12.0 - 15.0 g/dL   HCT 28.2 (L) 36.0 - 46.0 %   MCV 96.9 78.0 - 100.0 fL   MCH 30.9 26.0 - 34.0 pg   MCHC 31.9 30.0 - 36.0 g/dL   RDW 15.9 (H) 11.5 - 15.5 %   Platelets 220 150 - 400 K/uL  Procalcitonin     Status: None   Collection Time: 03/23/16  4:48 AM  Result Value Ref Range   Procalcitonin 0.96 ng/mL  Comment:        Interpretation: PCT > 0.5 ng/mL and <= 2 ng/mL: Systemic infection (sepsis) is possible, but other conditions are known to elevate PCT as well. (NOTE)         ICU PCT Algorithm               Non ICU PCT Algorithm    ----------------------------     ------------------------------         PCT < 0.25 ng/mL                 PCT < 0.1 ng/mL     Stopping of antibiotics            Stopping of antibiotics       strongly encouraged.               strongly encouraged.    ----------------------------     ------------------------------       PCT level decrease by               PCT < 0.25 ng/mL       >= 80% from peak PCT       OR PCT 0.25 - 0.5 ng/mL          Stopping of antibiotics                                             encouraged.     Stopping of antibiotics           encouraged.    ----------------------------     ------------------------------       PCT level decrease by              PCT >= 0.25 ng/mL       < 80% from peak PCT        AND PCT >= 0.5 ng/mL             Continuing antibiotics                                              encouraged.       Continuing antibiotics            encouraged.     ----------------------------     ------------------------------     PCT level increase compared          PCT > 0.5 ng/mL         with peak PCT AND          PCT >= 0.5 ng/mL             Escalation of antibiotics                                          strongly encouraged.      Escalation of antibiotics        strongly encouraged.   Glucose, capillary     Status: None   Collection Time: 03/23/16  7:38 AM  Result Value Ref Range   Glucose-Capillary 89 65 - 99 mg/dL    Imaging: Imaging results have been reviewed. CXR has improved (I have personally reviewed)  Tele- SR/ST with occassional PVCs  Assessment/Plan:   1. Principal Problem: 2.   Acute on chronic diastolic heart failure ([place]) 3. Active Problems: 4.   Bacteremia due to Streptococcus 5.   HCAP (healthcare-associated pneumonia) 6.   CKD (chronic kidney disease), stage III 7.   Diabetes mellitus type 2, uncontrolled ([place]) 8.   Acute on chronic diastolic congestive heart failure ([place]) 9.   CAP (community acquired pneumonia) 53.   Time Spent Directly with Patient:  20 minutes  Length of Stay:  LOS: 4 days   Clinically improved. No longer has CP. SCr better. WBC decreased 20K---> 10K. I/O minimally neg. Exam benign. Chronic 1-2+ BLE edema. Would put back on home diuretic dose (lasix 80 mg PO BID). Not sure the source of her sepsis or the duration of ATBX Rx  but can be D/Cd home from a cardiac view point. Will need TOC 7 with one of our MLPs and then ROV with Dr Johnsie Cancel 4-6 weeks.   Quay Burow 03/23/2016, 8:59 AM

## 2016-03-23 NOTE — Plan of Care (Signed)
Problem: Pain Managment: Goal: General experience of comfort will improve Outcome: Completed/Met Date Met:  03/23/16 Pt remains pain free  Problem: Activity: Goal: Risk for activity intolerance will decrease Outcome: Completed/Met Date Met:  03/23/16 Pt able to tolerate activity   Problem: Fluid Volume: Goal: Ability to maintain a balanced intake and output will improve Outcome: Completed/Met Date Met:  03/23/16 Pt has adequate intake and output   Problem: Nutrition: Goal: Adequate nutrition will be maintained Outcome: Completed/Met Date Met:  03/23/16 Pt able to tolerate current diet

## 2016-03-23 NOTE — Discharge Summary (Addendum)
Physician Discharge Summary   Patient ID: BRYAN OMURA MRN: [id number] DOB/AGE: [birth year] 83 y.o.  Admit date: 03/19/2016 Discharge date: 03/23/2016  Primary Care Physician:  Gaspar Garbe, MD  Discharge Diagnoses:    . Acute on chronic diastolic heart failure (HCC) . Bacteremia due to Streptococcus . HCAP (healthcare-associated pneumonia) . CKD (chronic kidney disease), stage III . Diabetes mellitus type 2, uncontrolled (HCC)  Consults: Cardiology  Recommendations for Outpatient Follow-up:  1. Continue Levaquin every other day for Streptococcus bacteremia 2. Please repeat CBC/BMET at next visit  DIET: Carb modified diet    Allergies:   Allergies  Allergen Reactions  . Penicillins Hives and Swelling    Has patient had a PCN reaction causing immediate rash, facial/tongue/throat swelling, SOB or lightheadedness with hypotension: Yes Has patient had a PCN reaction causing severe rash involving mucus membranes or skin necrosis: No Has patient had a PCN reaction that required hospitalization No Has patient had a PCN reaction occurring within the last 10 years: No If all of the above answers are "NO", then may proceed with Cephalosporin use.     DISCHARGE MEDICATIONS: Discharge Medication List as of 03/23/2016 11:22 AM    START taking these medications   Details  carvedilol (COREG) 3.125 MG tablet Take 1 tablet (3.125 mg total) by mouth 2 (two) times daily with a meal., Starting 03/23/2016, Until Discontinued, Print    levofloxacin (LEVAQUIN) 750 MG tablet Take 1 tablet (750 mg total) by mouth every other day., Starting 03/23/2016, Until Discontinued, Print    pantoprazole (PROTONIX) 40 MG tablet Take 1 tablet (40 mg total) by mouth daily., Starting 03/23/2016, Until Discontinued, Print      CONTINUE these medications which have CHANGED   Details  allopurinol (ZYLOPRIM) 100 MG tablet Take 2 tablets (200 mg total) by mouth daily., Starting 03/23/2016, Until  Discontinued, Print    insulin aspart (NOVOLOG) 100 UNIT/ML injection Inject 10 Units into the skin 2 (two) times daily before lunch and supper. If you eat more than 50% of your meal, Starting 03/23/2016, Until Discontinued, Print    insulin detemir (LEVEMIR) 100 UNIT/ML injection Inject 0.2 mLs (20 Units total) into the skin 2 (two) times daily., Starting 03/23/2016, Until Discontinued, Print      CONTINUE these medications which have NOT CHANGED   Details  albuterol (PROVENTIL HFA;VENTOLIN HFA) 108 (90 Base) MCG/ACT inhaler Inhale 1 puff into the lungs every 6 (six) hours as needed for wheezing or shortness of breath., Until Discontinued, Historical Med    aspirin EC 81 MG tablet Take 81 mg by mouth daily., Until Discontinued, Historical Med    docusate sodium (COLACE) 100 MG capsule Take 100 mg by mouth daily as needed for mild constipation or moderate constipation. , Until Discontinued, Historical Med    furosemide (LASIX) 40 MG tablet Take 2 tablets (80 mg total) by mouth 2 (two) times daily., Starting 12/18/2014, Until Discontinued, Normal    levothyroxine (SYNTHROID, LEVOTHROID) 25 MCG tablet Take 25 mcg by mouth daily before breakfast., Until Discontinued, Historical Med    mirtazapine (REMERON) 15 MG tablet Take 1 tablet (15 mg total) by mouth at bedtime., Starting 12/18/2014, Until Discontinued, Normal    !! Multiple Vitamins-Minerals (PRESERVISION AREDS 2) CAPS Take 1 capsule by mouth daily., Until Discontinued, Historical Med    !! Multiple Vitamins-Minerals (PRESERVISION AREDS 2) CAPS Take 1 capsule by mouth at bedtime., Until Discontinued, Historical Med    simvastatin (ZOCOR) 80 MG tablet Take 80 mg by mouth at  bedtime. , Until Discontinued, Historical Med    traMADol (ULTRAM) 50 MG tablet Take 50 mg by mouth at bedtime. , Until Discontinued, Historical Med     !! - Potential duplicate medications found. Please discuss with provider.    STOP taking these medications      amLODipine (NORVASC) 5 MG tablet      atenolol (TENORMIN) 100 MG tablet          Brief H and P: For complete details please refer to admission H and P, but in brief 82 y.o. female with history of diastolic CHF, CKD stage 4, and DM2 Who presented with chest pain, shortness of breath with coughing. Patient reported that she was in her usual state of health until the day before the admission when she started having chest pain with no radiation, coughing but no fevers or chills. She had associated lower extremity edema but patient reported as chronic. In ED, CXR suggested Rt sided infiltrate versus asymmetric edema.  Hospital Course:   Acute on chronic diastolic heart failure (HCC), mild to moderate aortic stenosis - Patient was placed on aggressive IV diuresis, severe lower extremity edema and JVP, - Cardiology was consulted. She is not transitioned to oral Lasix at her home dose, feeling back to baseline - 2-D echo on 6/7 had shown EF of 60-65% with grade 2 diastolic dysfunction   Bacteremia due to StreptococcusLikely from ?HCAP (healthcare-associated pneumonia)/CAP - Blood cultures positive for Streptococcus, ? Source - Chest x-ray 6/13 shows mild right basilar atelectasis lungs otherwise clear. Chest x-ray on admission 6/11 had shown patchy right-sided airspace opacity - urine strept Ag negative, however patient was already on antibiotics at the time urine strep antigen was ordered. urine Legionella antigen pending. UA negative - Repeat blood cultures negative so far, pro-calcitonin >1.75 - Patient received IV vancomycin and aztreonam through the hospitalization, transitioned to oral levofloxacin for 7 more doses to complete full course for bacteremia. 2-D echo did not show any vegetations.    CKD (chronic kidney disease), stage III-IV - Baseline appears to be around 2.2-2.3, per last creatinine in March 2016 - Continue IV Lasix, follow creatinine trend - Follows Dr.  Abel Presto   Diabetes mellitus type 2, uncontrolled (HCC) - CBGs were uncontrolled through the hospitalization, Levemir changed to 20 units twice a day, insulin meal coverage. She was recommended to follow up with her PCP and keep log.  Hypothyroidism Continue Synthroid 25 MCG daily  Day of Discharge BP 139/33 mmHg  Pulse 66  Temp(Src) 97.8 F (36.6 C) (Oral)  Resp 21  Ht  (1.549 m)  Wt 91.763 kg (202 lb 4.8 oz)  BMI 38.24 kg/m2  SpO2 88%  Physical Exam: General: Alert and awake oriented x3 not in any acute distress. HEENT: anicteric sclera, pupils reactive to light and accommodation CVS: S1-S2 clear no murmur rubs or gallops Chest: clear to auscultation bilaterally, no wheezing rales or rhonchi Abdomen: soft nontender, nondistended, normal bowel sounds Extremities: no cyanosis, clubbing or edema noted bilaterally Neuro: Cranial nerves II-XII intact, no focal neurological deficits   The results of significant diagnostics from this hospitalization (including imaging, microbiology, ancillary and laboratory) are listed below for reference.    LAB RESULTS: Basic Metabolic Panel:  Recent Labs Lab 03/21/16 1842 03/22/16 0445 03/23/16 0448  NA 134* 137 137  K 4.3 3.4* 4.2  CL 94* 98* 98*  CO2 GLUCOSE 304* 95 127*  BUN 98* 95* 93*  CREATININE 2.04* 1.93* 1.88*  CALCIUM 9.1 9.0 8.7*  MG 2.4  --   --    Liver Function Tests:  Recent Labs Lab 03/20/16 0037 03/21/16 0411  AST 26 42*  ALT 17 27  ALKPHOS 74 76  BILITOT 1.0 0.8  PROT 6.6 6.4*  ALBUMIN 3.2* 3.0*   No results for input(s): LIPASE, AMYLASE in the last 168 hours. No results for input(s): AMMONIA in the last 168 hours. CBC:  Recent Labs Lab 03/22/16 0445 03/23/16 0448  WBC 18.0* 10.8*  HGB 10.1* 9.0*  HCT 31.8* 28.2*  MCV 96.7 96.9  PLT 250 220   Cardiac Enzymes:  Recent Labs Lab 03/19/16 2013  TROPONINI 0.06*   BNP: Invalid input(s): POCBNP CBG:  Recent Labs Lab  03/23/16 0738 03/23/16 1128  GLUCAP 89 155*    Significant Diagnostic Studies:  X-ray Chest Pa And Lateral  03/20/2016  CLINICAL DATA:  Community acquired pneumonia. EXAM: CHEST  2 VIEW COMPARISON:  Single-view of the chest 06/11/20176 and 12/17/2014. FINDINGS: Patchy airspace disease in the right chest is improved. There small bilateral pleural effusions. Interstitial pulmonary edema appears improved. IMPRESSION: Improved airspace disease on the right. Small bilateral pleural effusions, worse on the right. Improved interstitial pulmonary edema. Electronically Signed   By: Drusilla Kanner M.D.   On: 03/20/2016 09:13   Dg Chest Portable 1 View  03/19/2016  CLINICAL DATA:  Acute onset of shortness of breath and respiratory distress. Initial encounter. EXAM: PORTABLE CHEST 1 VIEW COMPARISON:  Chest radiograph performed 12/17/2014 FINDINGS: The lungs are well-aerated. Patchy right-sided airspace opacity may reflect asymmetric pulmonary edema or pneumonia. There is no evidence of pleural effusion or pneumothorax. The cardiomediastinal silhouette is borderline normal in size. No acute osseous abnormalities are seen. IMPRESSION: Patchy right-sided airspace opacity may reflect asymmetric pulmonary edema or pneumonia. Electronically Signed   By: Roanna Raider M.D.   On: 03/19/2016 20:52    2D ECHO: Study Conclusions  - Left ventricle: The cavity size was normal. Wall thickness was  increased in a pattern of mild LVH. Systolic function was normal.  The estimated ejection fraction was in the range of 60% to 65%.  Wall motion was normal; there were no regional wall motion  abnormalities. Features are consistent with a pseudonormal left  ventricular filling pattern, with concomitant abnormal relaxation  and increased filling pressure (grade 2 diastolic dysfunction). - Aortic valve: Trileaflet; moderately calcified leaflets. There  was mild stenosis. Mean gradient (S): 16 mm Hg. - Mitral valve:  Mildly calcified annulus. Mildly calcified leaflets  . There was mild regurgitation. - Left atrium: The atrium was mildly dilated. - Right ventricle: The cavity size was normal. Systolic function  was normal. - Pulmonary arteries: PA peak pressure: 41 mm Hg (S). - Inferior vena cava: The vessel was normal in size. The  respirophasic diameter changes were in the normal range (>= 50%),  consistent with normal central venous pressure.  Impressions:  - Normal LV size with mild LV hypertrophy. EF 60-65%. Moderate  diastolic dysfunction. Normal RV size and systolic function. Mild  aortic stenosis. Mild pulmonary hypertension.  Disposition and Follow-up: Discharge Instructions    (HEART FAILURE PATIENTS) Call MD:  Anytime you have any of the following symptoms: 1) 3 pound weight gain in 24 hours or 5 pounds in 1 week 2) shortness of breath, with or without a dry hacking cough 3) swelling in the hands, feet or stomach 4) if you have to sleep on extra pillows at night in order to breathe.  Complete by:  As directed      [facility]B [facility]    Complete by:  As directed   Please [facility] RN[facility] for CHF and DM disease and symptom management and transition of care. Please call with questions. Written consent signed. Raiford NobleAtika Hall, MSN-Ed, RN,BSN-THN [facility] Liaison-[phone]  Reason for consult:  Please assign to [facility]  Diagnoses of:   Heart Failure Diabetes    Expected date of contact:  1-3 days (reserved for hospital discharges)     Diet Carb Modified    Complete by:  As directed      Increase activity slowly    Complete by:  As directed             DISPOSITION: home    DISCHARGE FOLLOW-UP Follow-up Information    Follow up with Gaspar Garbeichard W Tisovec, MD. Call in 1 week.   Specialty:  Internal Medicine   Why:  for hospital follow-up   Contact information:   [address] [place] [address] [phone]       Follow up with  Charlton HawsPeter Nishan, MD. Go on 04/06/2016.   Specialty:  Cardiology   Why:  for hospital follow-up @ 2pm   Contact information:   [address] [place] [address] [phone]        Time spent on Discharge: 35mins    Signed:   Tmya Wigington M.D. [facility] 03/23/2016, 1:05 PM Pager: [phone]      Coding query : Patient did not meet sepsis criteria on admission. However, blood cultures were subsequently positive for streptococcus likely from pneumonia    Damione Robideau M.D. [facility] 03/24/2016, 7:24 PM  Pager: [phone]

## 2016-03-23 NOTE — Progress Notes (Signed)
PT Cancellation Note  Patient Details Name: Lenora Boysnita M Hauter MRN: [id number] DOB: January 16, 1932   Cancelled Treatment:    Reason Eval/Treat Not Completed: Patient declined, no reason specified.  Attempted twice, but pt states she is going home today and did not want to ambulate.  Husband and PT encouraging her and offered stair training, but pt continued to decline.   Elby Blackwelder LUBECK 03/23/2016, 12:14 PM

## 2016-03-23 NOTE — Progress Notes (Signed)
Pharmacy Antibiotic Note  Donna Hurst is a 82 y.o. female admitted on 03/19/2016 with pneumonia, on D#5 vancomycin and aztreonam and to change to levaquin. afeb, wbc = 10.8, SCr= 1.88,  Crcl ~ 20 ml/min. Pt with penicillin allergy (hives, swelling), no previous record of cephalosporin use.   Aztreonam 6/12 >>  Vanc 6/12 >>   6/11 blood x 2 - 1/2 strep  6/13 blood x 2 - ngtd 6/13 urine- neg  Plan: -Levaquin 750mg  po q48hrs -Will follow renal function and clinical progress    Temp (24hrs), Avg:98 F (36.7 C), Min:97.8 F (36.6 C), Max:98.3 F (36.8 C)   Recent Labs Lab 03/19/16 2013 03/19/16 2248 03/20/16 0037 03/20/16 0105 03/21/16 0411 03/21/16 1842 03/22/16 0445 03/23/16 0448  WBC 20.7*  --  15.4*  --  20.4*  --  18.0* 10.8*  CREATININE 2.41*  --  2.44*  --  2.28* 2.04* 1.93* 1.88*  LATICACIDVEN  --  3.66*  --  3.00*  --   --   --   --     Estimated Creatinine Clearance: 23.8 mL/min (by C-G formula based on Cr of 1.88).    Allergies  Allergen Reactions  . Penicillins Hives and Swelling    Has patient had a PCN reaction causing immediate rash, facial/tongue/throat swelling, SOB or lightheadedness with hypotension: Yes Has patient had a PCN reaction causing severe rash involving mucus membranes or skin necrosis: No Has patient had a PCN reaction that required hospitalization No Has patient had a PCN reaction occurring within the last 10 years: No If all of the above answers are "NO", then may proceed with Cephalosporin use.   Harland GermanAndrew Imani Sherrin, Pharm D 03/23/2016 10:07 AM

## 2016-03-24 ENCOUNTER — Other Ambulatory Visit: Payer: Self-pay | Admitting: *Deleted

## 2016-03-24 LAB — CULTURE, BLOOD (ROUTINE X 2): Culture: NO GROWTH

## 2016-03-24 NOTE — Patient Outreach (Signed)
Referral received from hospital liaison to initiate transition of care program once member discharged.  She was admitted to hospital on 6/11 for community acquired pneumonia and congestive heart failure, discharged 6/15.  According to chart, she also has history of diabetes, chronic kidney disease, hypertension, and aortic stenosis.  Call placed to member, identity verified.  This care manager introduced self and purpose of call.  She state that feels she is doing "alright" and can't think of any assistance/resources she need at this time.  [facility] further explained, she denies having time to discuss, stating she was getting help with her bath.  She agrees to receive another call from this care manager next week to discuss involvement with [facility].  Will follow up next week.  Kemper DurieMonica Lorry Anastasi, [place]RN, MSN [facility]  [facility] Care Manager [phone]

## 2016-03-26 LAB — CULTURE, BLOOD (ROUTINE X 2)
Culture: NO GROWTH
Culture: NO GROWTH

## 2016-03-27 ENCOUNTER — Other Ambulatory Visit: Payer: Self-pay | Admitting: *Deleted

## 2016-03-27 NOTE — Patient Outreach (Signed)
Call placed to member to again present involvement with [facility], as she requested last week.  No answer, HIPPA compliant voice message left.  Will await call back, will follow up on later this week if no return call.  Kemper DurieMonica Teckla Christiansen, [place]RN, MSN [facility] Care [facility]  [facility] Care Manager [phone]

## 2016-03-29 ENCOUNTER — Other Ambulatory Visit: Payer: Self-pay | Admitting: *Deleted

## 2016-03-29 NOTE — Patient Outreach (Signed)
Call placed to member in attempt to start transition of care program.  She was discharged on 6/15, this makes third attempt to initiate program.  First call was not a good time to talk, second attempt was unsuccessful, today she state again that it's not a good time to talk.  She request this care manager to call back.  Made aware that this are manager will be on vacation the rest of the week and will have to follow up next week.  She verbalizes understanding.  Kemper DurieMonica Usama Harkless, [place]RN, MSN [facility] Care Management  [facility] Care Manager [phone]

## 2016-04-04 ENCOUNTER — Encounter: Payer: Self-pay | Admitting: *Deleted

## 2016-04-04 ENCOUNTER — Other Ambulatory Visit: Payer: Self-pay | Admitting: *Deleted

## 2016-04-04 NOTE — Patient Outreach (Signed)
Call placed to member in attempt to again complete transition of care program.  She states this is not a good time to talk.  This makes 4th attempt to start program.  Member made aware that information will be sent in mail for [facility] and to contact this care manager if she is interested.  Will wait 10 days, if no response, will close case.  Kemper DurieMonica Teckla Christiansen, [place]RN, MSN [facility] Care Management  [facility] Care Manager [phone]

## 2016-04-05 NOTE — Progress Notes (Signed)
Cardiology Office Note    Date:  04/06/2016   ID:  Donna Hurst, DOB Aug 22, 1932, MRN [id number]  PCP:  Gaspar Garbe, MD  Cardiologist:  Dr. Eden Emms   CC: post hospital follow up  History of Present Illness:  Donna Hurst is a 82 y.o. female with a history of HTN, DM, HLD, mild AS, CKD, carotid artery stenosis and chronic diastolic CHF who presents to clinic for post hospital f/u after a recent admission for A/C D CHF in the setting of sepsis and bacteremia.  She was admitted 3/8-3/11/16 for A/C D CHF in the setting of self d/c of diuretics. There was a mild elevation in troponin, likely due to her CHF as well as her acute on chronic renal failure. Echocardiogram showed preserved EF of 60%, but grade 3 diastolic dysfunction and moderate pulmonary HTN. Renal consult was initially obtained to help with diuretics, which she thankfully responded well to as she has had past outpatient discussions on dialysis needs going forward and has refused this.  She was more recently admitted 6/11- 03/23/16 for A/C D CHF in the setting of bacteremia and sepsis from unknown cause. She was treated with IV abx and transitioned to PO. Her last echo on 03/15/16 showed LVEF was 60-65%, no regional wall motion abnormalities, G2DD, mild AS with a mean gradient of 16 mm Hg. She was diuresed with IV lasix. Discharge weight was 202 lbs and she was discharged on her home dose of lasix 80mg  BID.   Today she presents to clinic for follow up. Since discharge has continued to have SOB with exertion. She feels pressure when she lays flat and sometimes with exertion. No orthopnea or PND. She developed a rash on her back and abdomen that extends under her breasts and axilla. She also had some tongue redness and swelling which has now gone down. No palpitations, dizziness or syncope. No blood in her stool or urine.  Past Medical History  Diagnosis Date  . Hypertension   . Diabetes mellitus with renal manifestation (HCC)   .  CKD (chronic kidney disease), stage IV (HCC)   . Secondary hyperparathyroidism (HCC)   . Anemia of chronic disease   . Obesity   . Dyslipidemia   . Hypothyroidism   . Chronic diastolic CHF (congestive heart failure) (HCC)   . Gout   . Osteoarthritis   . Edema   . Systolic murmur     a. Noted in 2011; 2D Echo 01/2010: poor acoustic quality, normal wall thickness, EF 60-65%, mild-mod TR, PASP .  Marland Kitchen Shortness of breath dyspnea     Past Surgical History  Procedure Laterality Date  . Rotator cuff repair    . Hip surgery      For OA  . Back surgery    . Ankle surgery Left     " many years ago "    Current Medications: Outpatient Prescriptions Prior to Visit  Medication Sig Dispense Refill  . albuterol (PROVENTIL HFA;VENTOLIN HFA) 108 (90 Base) MCG/ACT inhaler Inhale 1 puff into the lungs every 6 (six) hours as needed for wheezing or shortness of breath.    . allopurinol (ZYLOPRIM) 100 MG tablet Take 2 tablets (200 mg total) by mouth daily. 60 tablet 3  . aspirin EC 81 MG tablet Take 81 mg by mouth daily.    . carvedilol (COREG) 3.125 MG tablet Take 1 tablet (3.125 mg total) by mouth 2 (two) times daily with a meal. 60 tablet 3  .  docusate sodium (COLACE) 100 MG capsule Take 100 mg by mouth daily as needed for mild constipation or moderate constipation.     . furosemide (LASIX) 40 MG tablet Take 2 tablets (80 mg total) by mouth 2 (two) times daily. 120 tablet 5  . insulin aspart (NOVOLOG) 100 UNIT/ML injection Inject 10 Units into the skin 2 (two) times daily before lunch and supper. If you eat more than 50% of your meal 10 mL 11  . insulin detemir (LEVEMIR) 100 UNIT/ML injection Inject 0.2 mLs (20 Units total) into the skin 2 (two) times daily. 10 mL 11  . levothyroxine (SYNTHROID, LEVOTHROID) 25 MCG tablet Take 25 mcg by mouth daily before breakfast.    . mirtazapine (REMERON) 15 MG tablet Take 1 tablet (15 mg total) by mouth at bedtime. 30 tablet 3  . Multiple Vitamins-Minerals  (PRESERVISION AREDS 2) CAPS Take 1 capsule by mouth daily.    . Multiple Vitamins-Minerals (PRESERVISION AREDS 2) CAPS Take 1 capsule by mouth at bedtime.    . pantoprazole (PROTONIX) 40 MG tablet Take 1 tablet (40 mg total) by mouth daily. 30 tablet 1  . simvastatin (ZOCOR) 80 MG tablet Take 80 mg by mouth at bedtime.     . traMADol (ULTRAM) 50 MG tablet Take 50 mg by mouth at bedtime.     Marland Kitchen. levofloxacin (LEVAQUIN) 750 MG tablet Take 1 tablet (750 mg total) by mouth every other day. 8 tablet 0   No facility-administered medications prior to visit.     Allergies:   Penicillins   Social History   Social History  . Marital Status: Married    Spouse Name: N/A  . Number of Children: N/A  . Years of Education: N/A   Occupational History  . Retired Engineer, civil (consulting)urse    Social History Main Topics  . Smoking status: Former Games developermoker  . Smokeless tobacco: Never Used     Comment: Smoked for <10 years  . Alcohol Use: No  . Drug Use: No  . Sexual Activity: Not Asked   Other Topics Concern  . None   Social History Narrative     Family History:  The patient's family history includes CAD in her brother, brother, and father; Diabetes in her mother; Hypertension in her mother.     ROS:   Please see the history of present illness.    ROS All other systems reviewed and are negative.   PHYSICAL EXAM:   VS:  BP 160/62 mmHg  Pulse 89  Ht 5' (1.524 m)  Wt 192 lb (87.091 kg)  BMI 37.50 kg/m2  SpO2 97%   GEN: Well nourished, well developed, in no acute distressobese HEENT: normal Neck: no JVD, carotid bruits, or masses Cardiac: RRR; no murmurs, rubs, or gallops,trace LE edema  Respiratory:  clear to auscultation bilaterally, normal work of breathing GI: soft, nontender, nondistended, + BS MS: no deformity or atrophy Skin: warm and dry, erythematous rash on her back and abdomen that extends under her breasts and axilla.  Neuro:  Alert and Oriented x 3, Strength and sensation are intact Psych:  euthymic mood, full affect  Wt Readings from Last 3 Encounters:  04/06/16 192 lb (87.091 kg)  03/23/16 202 lb 4.8 oz (91.763 kg)  02/10/16 191 lb 6.4 oz (86.818 kg)      Studies/Labs Reviewed:   EKG:  EKG is NOT ordered today.   Recent Labs: 03/19/2016: B Natriuretic Peptide 349.2*; TSH 1.323 03/21/2016: ALT 27; Magnesium 2.4 03/23/2016: BUN 93*; Creatinine, Ser 1.88*;  Hemoglobin 9.0*; Platelets 220; Potassium 4.2; Sodium 137   Lipid Panel No results found for: CHOL, TRIG, HDL, CHOLHDL, VLDL, LDLCALC, LDLDIRECT  Additional studies/ records that were reviewed today include:  2D ECHO:03/15/16 Study Conclusions - Left ventricle: The cavity size was normal. Wall thickness was increased in a pattern of mild LVH. Systolic function was normal.The estimated ejection fraction was in the range of 60% to 65%. Wall motion was normal; there were no regional wall motion abnormalities. Features are consistent with a pseudonormal left ventricular filling pattern, with concomitant abnormal relaxation and increased filling pressure (grade 2 diastolic dysfunction). - Aortic valve: Trileaflet; moderately calcified leaflets. There  was mild stenosis. Mean gradient (S): 16 mm Hg. - Mitral valve: Mildly calcified annulus. Mildly calcified leaflets  . There was mild regurgitation. - Left atrium: The atrium was mildly dilated. - Right ventricle: The cavity size was normal. Systolic function was normal. - Pulmonary arteries: PA peak pressure: 41 mm Hg (S). - Inferior vena cava: The vessel was normal in size. The  respirophasic diameter changes were in the normal range (>= 50%), consistent with normal central venous pressure. Impressions: - Normal LV size with mild LV hypertrophy. EF 60-65%. Moderate diastolic dysfunction. Normal RV size and systolic function. Mild aortic stenosis. Mild pulmonary hypertension.   ASSESSMENT & PLAN:   Chronic diastolic heart failure - 2-D echo on 6/7 with EF of 60-65% with  grade 2 diastolic dysfunction. Discharged on home dose of Lasix 80mg  BID. Her weight is down from admission 202 --> 192 lbs. She appears euvolemic. BMET today  Bacteremia due to StreptococcusLikely from ?HCAP/CAP - Blood cultures positive for Streptococcus, ? Source - Chest x-ray 6/13 shows mild right basilar atelectasis lungs otherwise clear. Chest x-ray on admission 6/11 had shown patchy right-sided airspace opacity - Patient received IV vancomycin and aztreonam through the hospitalization, transitioned to oral levofloxacin QOD for 7 more doses to complete full course for bacteremia. 2-D echo did not show any vegetations.   HTN: elevated to 160/62 today on Coreg 3.125mg  BID. HR during admission in low 60s. She was previously on amlodipine 5mg  daily which was discontinued while admitted. Will add this back for better BP control. If BP remains high we can try up titrating her BB with close eye on HR.    Chest pressure and DOE: we discussed stress testing. She is not interested in this at this time. She did have mild elevations in troponin during most recent admission most c/w demand ischemia, but she does have many RFs for CAD (and known carotid dz). We discussed also how we would like to avoid cath due to renal insufficiency. For now we will treat medically and try imdur 30mg  daily to see if this helps her sx.  - Continue ASA, statin and BB.   CKD (chronic kidney disease), stage III-IV - Baseline appears to be around 2.2-2.3. Creat at discharge 1.88  - Continue PO Lasix 80mg  BID. BMET today - Follows Dr. Abel Prestoolodonato, does not want HD  Diabetes mellitus type 2: Last HgA1c 7.8  Hypothyroidism: Continue Synthroid 25 MCG daily   Carotid artery disease: 08/17/15 duplex w/ 40-59% bilateral ICA stenosis f/u duplex 08/2016   Mild to moderate aortic stenosis: Trileaflet; moderately calcified leaflets. There was mild stenosis. Mean gradient (S): 16 mm Hg. Repeat 2D ECHO in 03/2017  Rash and tongue  swelling: erythematous rash on her back and abdomen that extends under her breasts and axilla. Patient thinks this is due to Levaquin. Will defer to  Dr. Wylene Simmer who she sees tomorrow.   Medication Adjustments/Labs and Tests Ordered: Current medicines are reviewed at length with the patient today.  Concerns regarding medicines are outlined above.  Medication changes, Labs and Tests ordered today are listed in the Patient Instructions below. Patient Instructions  Medication Instructions:  Your physician has recommended you make the following change in your medication:  1.  RESTART the Amlodipine 5 mg taking 1 tablet daily 2. Start imdur  daily   Labwork: TODAY;  BMET  Testing/Procedures: None ordered  Follow-Up:  Your physician recommends that you schedule a follow-up appointment in: 3 MONTHS WITH DR. Eden Emms  Any Other Special Instructions Will Be Listed Below (If Applicable).     If you need a refill on your cardiac medications before your next appointment, please call your pharmacy.       Signed, Cline Crock, PA-C  04/06/2016 3:05 PM    [facility] [address] [place], [place], [place]  [address] Phone: [phone]; Fax: [phone]

## 2016-04-06 ENCOUNTER — Encounter: Payer: Self-pay | Admitting: Physician Assistant

## 2016-04-06 ENCOUNTER — Ambulatory Visit (INDEPENDENT_AMBULATORY_CARE_PROVIDER_SITE_OTHER): Payer: PPO | Admitting: Physician Assistant

## 2016-04-06 ENCOUNTER — Telehealth: Payer: Self-pay | Admitting: *Deleted

## 2016-04-06 VITALS — BP 160/62 | HR 89 | Ht 60.0 in | Wt 192.0 lb

## 2016-04-06 DIAGNOSIS — I5033 Acute on chronic diastolic (congestive) heart failure: Secondary | ICD-10-CM

## 2016-04-06 LAB — BASIC METABOLIC PANEL
BUN: 43 mg/dL — ABNORMAL HIGH (ref 7–25)
CALCIUM: 9.8 mg/dL (ref 8.6–10.4)
CO2: 28 mmol/L (ref 20–31)
CREATININE: 2.01 mg/dL — AB (ref 0.60–0.88)
Chloride: 99 mmol/L (ref 98–110)
Glucose, Bld: 59 mg/dL — ABNORMAL LOW (ref 65–99)
Potassium: 4.2 mmol/L (ref 3.5–5.3)
Sodium: 142 mmol/L (ref 135–146)

## 2016-04-06 MED ORDER — ISOSORBIDE MONONITRATE ER 30 MG PO TB24: 30.0000 mg | ORAL_TABLET | Freq: Every day | ORAL | Status: DC

## 2016-04-06 MED ORDER — AMLODIPINE BESYLATE 5 MG PO TABS: 5.0000 mg | ORAL_TABLET | Freq: Every day | ORAL | Status: DC

## 2016-04-06 NOTE — Patient Instructions (Addendum)
Medication Instructions:  Your physician has recommended you make the following change in your medication:  1.  RESTART the Amlodipine 5 mg taking 1 tablet daily   Labwork: TODAY;  BMET  Testing/Procedures: None ordered  Follow-Up:  Your physician recommends that you schedule a follow-up appointment in: 3 MONTHS WITH DR. Eden EmmsNISHAN  Any Other Special Instructions Will Be Listed Below (If Applicable).     If you need a refill on your cardiac medications before your next appointment, please call your pharmacy.

## 2016-04-06 NOTE — Telephone Encounter (Signed)
Pt returned my call and was sent to the pharmacy line, Verneda SkillMindy Isley explained to pt that Imdur was sent to her pharmacy.

## 2016-04-06 NOTE — Telephone Encounter (Signed)
Patient called back. I made her aware that the rx for imdur had been sent to [facility].

## 2016-04-06 NOTE — Telephone Encounter (Signed)
Called pt to let her know we were going to start Imdur 30 mg qd.  Left a message for her to call me back

## 2016-04-07 ENCOUNTER — Other Ambulatory Visit: Payer: Self-pay | Admitting: Physician Assistant

## 2016-04-07 DIAGNOSIS — R7881 Bacteremia: Secondary | ICD-10-CM | POA: Diagnosis not present

## 2016-04-07 DIAGNOSIS — Z794 Long term (current) use of insulin: Secondary | ICD-10-CM | POA: Diagnosis not present

## 2016-04-07 DIAGNOSIS — D692 Other nonthrombocytopenic purpura: Secondary | ICD-10-CM | POA: Diagnosis not present

## 2016-04-07 DIAGNOSIS — E1129 Type 2 diabetes mellitus with other diabetic kidney complication: Secondary | ICD-10-CM | POA: Diagnosis not present

## 2016-04-07 DIAGNOSIS — M109 Gout, unspecified: Secondary | ICD-10-CM | POA: Diagnosis not present

## 2016-04-07 DIAGNOSIS — Z6838 Body mass index (BMI) 38.0-38.9, adult: Secondary | ICD-10-CM | POA: Diagnosis not present

## 2016-04-07 DIAGNOSIS — J159 Unspecified bacterial pneumonia: Secondary | ICD-10-CM | POA: Diagnosis not present

## 2016-04-07 DIAGNOSIS — N184 Chronic kidney disease, stage 4 (severe): Secondary | ICD-10-CM | POA: Diagnosis not present

## 2016-04-07 DIAGNOSIS — E038 Other specified hypothyroidism: Secondary | ICD-10-CM | POA: Diagnosis not present

## 2016-04-07 DIAGNOSIS — I5032 Chronic diastolic (congestive) heart failure: Secondary | ICD-10-CM | POA: Diagnosis not present

## 2016-04-07 DIAGNOSIS — F321 Major depressive disorder, single episode, moderate: Secondary | ICD-10-CM | POA: Diagnosis not present

## 2016-04-07 DIAGNOSIS — I130 Hypertensive heart and chronic kidney disease with heart failure and stage 1 through stage 4 chronic kidney disease, or unspecified chronic kidney disease: Secondary | ICD-10-CM | POA: Diagnosis not present

## 2016-04-10 ENCOUNTER — Encounter: Payer: Self-pay | Admitting: *Deleted

## 2016-04-10 ENCOUNTER — Other Ambulatory Visit: Payer: Self-pay | Admitting: *Deleted

## 2016-04-10 NOTE — Patient Outreach (Signed)
Call received from member after receiving outreach letter.  [facility] explained, she denies needing any resources with the exception of home health physical therapy, stating "my son cooks and my husband takes care of everything else."  She state that she has an appointment with her PCP last week and they were to place the order, but she is not sure if this has happened.  Again offered [facility], she denies needing the assistance.  Call then placed to Dr. Deneen Hartsisovec's office, spoke with DJ who confirms that an order for home health PT has been placed.  She is made aware that member has denied any further services from [facility].  Call also received from Crystal with [facility].  She state that she spoke to the member and the member has also declined their services.  She is made aware that this care manager confirmed PT order with PCP office.  Will notify PCP and care management assistant of case closure.  Kemper DurieMonica Jerson Furukawa, [place]RN, MSN [facility] Care Management  [facility] Care Manager [phone]

## 2016-04-18 IMAGING — CR DG CHEST 1V PORT
1 series · 1 of 1 positions shown · non-contrast
Comparison: 12/05/2010 and earlier.

CLINICAL DATA: 83-year-old female with shortness of breath and
lower extremity swelling. Initial encounter.

EXAM:
PORTABLE CHEST - 1 VIEW

[AP]
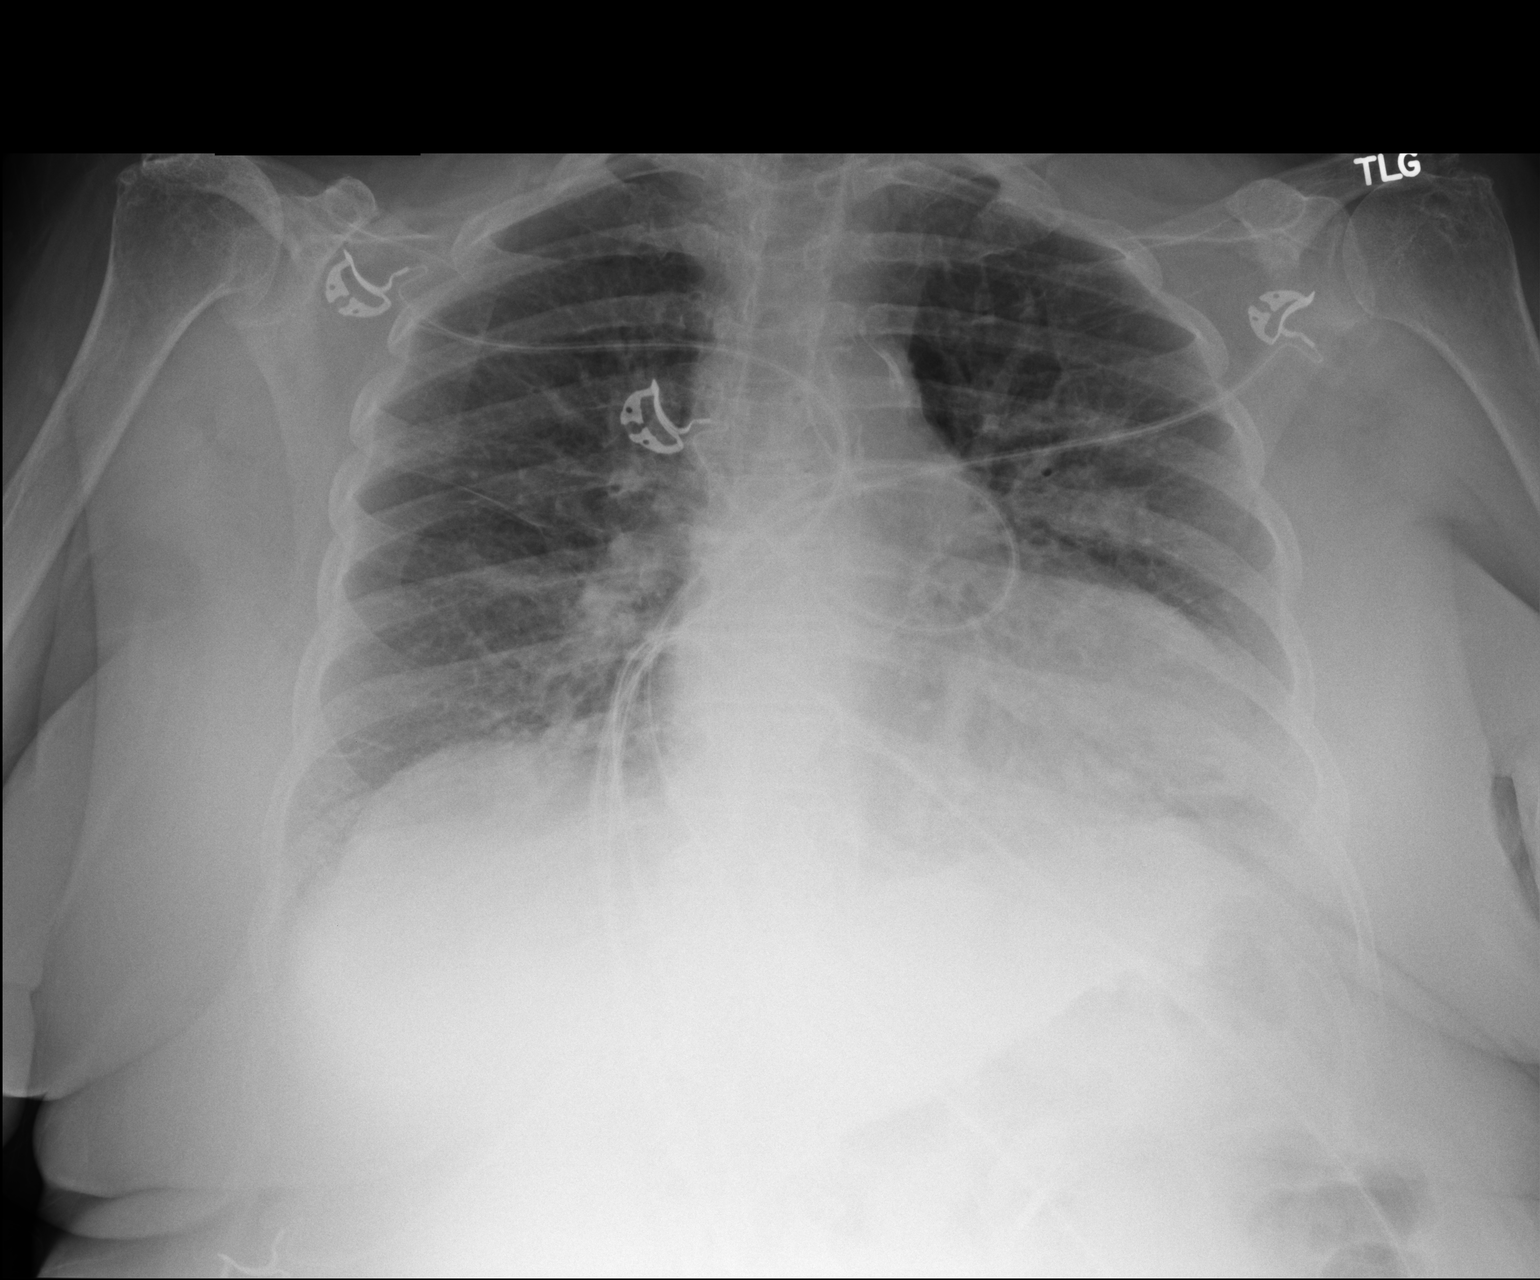

[1 of 1 positions shown; findings below may reference images not displayed]

FINDINGS: Portable AP semi upright view at 4573 hrs. Stable cardiac size
allowing for portable technique. Other mediastinal contours are
within normal limits. Calcified atherosclerosis of the aorta. No
pneumothorax. Increased pulmonary vascularity. No definite effusion.
Superimposed perihilar opacity favored to be atelectasis. No
consolidation.
IMPRESSION: Increased pulmonary vascularity compatible with mild interstitial
edema. Increased perihilar opacity, favor atelectasis.

## 2016-04-21 ENCOUNTER — Ambulatory Visit: Payer: PPO | Admitting: Physical Therapy

## 2016-05-25 ENCOUNTER — Encounter: Payer: Self-pay | Admitting: Cardiovascular Disease

## 2016-06-05 ENCOUNTER — Encounter: Payer: Self-pay | Admitting: Cardiovascular Disease

## 2016-06-20 ENCOUNTER — Ambulatory Visit: Payer: PPO | Admitting: Cardiovascular Disease

## 2016-07-06 DIAGNOSIS — Z6838 Body mass index (BMI) 38.0-38.9, adult: Secondary | ICD-10-CM | POA: Diagnosis not present

## 2016-07-06 DIAGNOSIS — I10 Essential (primary) hypertension: Secondary | ICD-10-CM | POA: Diagnosis not present

## 2016-07-06 DIAGNOSIS — E1129 Type 2 diabetes mellitus with other diabetic kidney complication: Secondary | ICD-10-CM | POA: Diagnosis not present

## 2016-07-06 DIAGNOSIS — D692 Other nonthrombocytopenic purpura: Secondary | ICD-10-CM | POA: Diagnosis not present

## 2016-07-06 DIAGNOSIS — M109 Gout, unspecified: Secondary | ICD-10-CM | POA: Diagnosis not present

## 2016-07-06 DIAGNOSIS — E038 Other specified hypothyroidism: Secondary | ICD-10-CM | POA: Diagnosis not present

## 2016-07-06 DIAGNOSIS — F321 Major depressive disorder, single episode, moderate: Secondary | ICD-10-CM | POA: Diagnosis not present

## 2016-07-06 DIAGNOSIS — Z23 Encounter for immunization: Secondary | ICD-10-CM | POA: Diagnosis not present

## 2016-07-06 DIAGNOSIS — I130 Hypertensive heart and chronic kidney disease with heart failure and stage 1 through stage 4 chronic kidney disease, or unspecified chronic kidney disease: Secondary | ICD-10-CM | POA: Diagnosis not present

## 2016-07-06 DIAGNOSIS — N184 Chronic kidney disease, stage 4 (severe): Secondary | ICD-10-CM | POA: Diagnosis not present

## 2016-07-06 DIAGNOSIS — I5032 Chronic diastolic (congestive) heart failure: Secondary | ICD-10-CM | POA: Diagnosis not present

## 2016-07-06 DIAGNOSIS — E782 Mixed hyperlipidemia: Secondary | ICD-10-CM | POA: Diagnosis not present

## 2016-07-07 DIAGNOSIS — I10 Essential (primary) hypertension: Secondary | ICD-10-CM | POA: Diagnosis not present

## 2016-07-07 DIAGNOSIS — M109 Gout, unspecified: Secondary | ICD-10-CM | POA: Diagnosis not present

## 2016-07-07 DIAGNOSIS — E669 Obesity, unspecified: Secondary | ICD-10-CM | POA: Diagnosis not present

## 2016-07-07 DIAGNOSIS — E1129 Type 2 diabetes mellitus with other diabetic kidney complication: Secondary | ICD-10-CM | POA: Diagnosis not present

## 2016-07-07 DIAGNOSIS — D631 Anemia in chronic kidney disease: Secondary | ICD-10-CM | POA: Diagnosis not present

## 2016-07-07 DIAGNOSIS — E039 Hypothyroidism, unspecified: Secondary | ICD-10-CM | POA: Diagnosis not present

## 2016-07-07 DIAGNOSIS — N2581 Secondary hyperparathyroidism of renal origin: Secondary | ICD-10-CM | POA: Diagnosis not present

## 2016-07-07 DIAGNOSIS — I509 Heart failure, unspecified: Secondary | ICD-10-CM | POA: Diagnosis not present

## 2016-07-07 DIAGNOSIS — N184 Chronic kidney disease, stage 4 (severe): Secondary | ICD-10-CM | POA: Diagnosis not present

## 2016-08-15 ENCOUNTER — Ambulatory Visit (INDEPENDENT_AMBULATORY_CARE_PROVIDER_SITE_OTHER): Payer: PPO | Admitting: Ophthalmology

## 2016-08-15 DIAGNOSIS — H35033 Hypertensive retinopathy, bilateral: Secondary | ICD-10-CM | POA: Diagnosis not present

## 2016-08-15 DIAGNOSIS — I10 Essential (primary) hypertension: Secondary | ICD-10-CM

## 2016-08-15 DIAGNOSIS — E113393 Type 2 diabetes mellitus with moderate nonproliferative diabetic retinopathy without macular edema, bilateral: Secondary | ICD-10-CM | POA: Diagnosis not present

## 2016-08-15 DIAGNOSIS — H43813 Vitreous degeneration, bilateral: Secondary | ICD-10-CM

## 2016-08-15 DIAGNOSIS — E11319 Type 2 diabetes mellitus with unspecified diabetic retinopathy without macular edema: Secondary | ICD-10-CM

## 2016-08-15 DIAGNOSIS — H353132 Nonexudative age-related macular degeneration, bilateral, intermediate dry stage: Secondary | ICD-10-CM

## 2016-08-23 ENCOUNTER — Ambulatory Visit (HOSPITAL_COMMUNITY)
Admission: RE | Admit: 2016-08-23 | Discharge: 2016-08-23 | Disposition: A | Discharge status: Home / Self Care | Payer: PPO | Source: Ambulatory Visit | Attending: Cardiology | Admitting: Cardiology

## 2016-08-23 ENCOUNTER — Encounter (HOSPITAL_COMMUNITY): Payer: PPO

## 2016-08-23 DIAGNOSIS — R0989 Other specified symptoms and signs involving the circulatory and respiratory systems: Secondary | ICD-10-CM | POA: Diagnosis not present

## 2016-08-23 DIAGNOSIS — I6523 Occlusion and stenosis of bilateral carotid arteries: Secondary | ICD-10-CM | POA: Diagnosis not present

## 2016-08-23 DIAGNOSIS — I10 Essential (primary) hypertension: Secondary | ICD-10-CM | POA: Insufficient documentation

## 2016-08-23 DIAGNOSIS — Z87891 Personal history of nicotine dependence: Secondary | ICD-10-CM | POA: Insufficient documentation

## 2016-08-23 DIAGNOSIS — E119 Type 2 diabetes mellitus without complications: Secondary | ICD-10-CM | POA: Diagnosis not present

## 2016-08-23 DIAGNOSIS — E785 Hyperlipidemia, unspecified: Secondary | ICD-10-CM | POA: Diagnosis not present

## 2016-09-13 NOTE — Progress Notes (Signed)
Cardiology Office Note    Date:  09/18/2016   ID:  Donna Hurst, DOB April 21, 1932, MRN [id number]  PCP:  Gaspar Garbe, MD  Cardiologist:  Dr. Eden Emms   CC: post hospital follow up  History of Present Illness:  Donna Hurst is a 82 y.o. female with a history of HTN, DM, HLD, mild AS, CKD, carotid artery stenosis and chronic diastolic CHF who presents to clinic for post hospital f/u after a recent admission for A/C D CHF in the setting of sepsis and bacteremia.  She was admitted 3/8-3/11/16 for A/C D CHF in the setting of self d/c of diuretics. There was a mild elevation in troponin, likely due to her CHF as well as her acute on chronic renal failure. Echocardiogram showed preserved EF of 60%, but grade 3 diastolic dysfunction and moderate pulmonary HTN. Renal consult was initially obtained to help with diuretics, which she thankfully responded well to as she has had past outpatient discussions on dialysis needs going forward and has refused this.  She was more recently admitted 6/11- 03/23/16 for A/C D CHF in the setting of bacteremia and sepsis from unknown cause. She was treated with IV abx and transitioned to PO. Her last echo on 03/15/16 showed LVEF was 60-65%, no regional wall motion abnormalities, G2DD, mild AS with a mean gradient of 16 mm Hg. She was diuresed with IV lasix. Discharge weight was 202 lbs and she was discharged on her home dose of lasix 80mg  BID.   Having chest pain only at night in recumbancy Has tried all kinds of medicine for reflux With no relief. Pain improves when she sits up   Past Medical History:  Diagnosis Date  . Anemia of chronic disease   . Chronic diastolic CHF (congestive heart failure) (HCC)   . CKD (chronic kidney disease), stage IV (HCC)   . Diabetes mellitus with renal manifestation (HCC)   . Dyslipidemia   . Edema   . Gout   . Hypertension   . Hypothyroidism   . Obesity   . Osteoarthritis   . Secondary hyperparathyroidism (HCC)   .  Shortness of breath dyspnea   . Systolic murmur    a. Noted in 2011; 2D Echo 01/2010: poor acoustic quality, normal wall thickness, EF 60-65%, mild-mod TR, PASP .    Past Surgical History:  Procedure Laterality Date  . ANKLE SURGERY Left    " many years ago "  . BACK SURGERY    . HIP SURGERY     For OA  . ROTATOR CUFF REPAIR      Current Medications: Outpatient Medications Prior to Visit  Medication Sig Dispense Refill  . albuterol (PROVENTIL HFA;VENTOLIN HFA) 108 (90 Base) MCG/ACT inhaler Inhale 1 puff into the lungs every 6 (six) hours as needed for wheezing or shortness of breath.    . allopurinol (ZYLOPRIM) 100 MG tablet Take 2 tablets (200 mg total) by mouth daily. 60 tablet 3  . amLODipine (NORVASC) 5 MG tablet Take 1 tablet (5 mg total) by mouth daily. 90 tablet 3  . aspirin EC 81 MG tablet Take 81 mg by mouth daily.    . carvedilol (COREG) 3.125 MG tablet Take 1 tablet (3.125 mg total) by mouth 2 (two) times daily with a meal. 60 tablet 3  . docusate sodium (COLACE) 100 MG capsule Take 100 mg by mouth daily as needed for mild constipation or moderate constipation.     . furosemide (LASIX) 40 MG tablet Take  2 tablets (80 mg total) by mouth 2 (two) times daily. 120 tablet 5  . insulin aspart (NOVOLOG) 100 UNIT/ML injection Inject 10 Units into the skin 2 (two) times daily before lunch and supper. If you eat more than 50% of your meal 10 mL 11  . insulin detemir (LEVEMIR) 100 UNIT/ML injection Inject 0.2 mLs (20 Units total) into the skin 2 (two) times daily. 10 mL 11  . isosorbide mononitrate (IMDUR) 30 MG 24 hr tablet Take 1 tablet (30 mg total) by mouth daily. 90 tablet 3  . levothyroxine (SYNTHROID, LEVOTHROID) 25 MCG tablet Take 25 mcg by mouth daily before breakfast.    . mirtazapine (REMERON) 15 MG tablet Take 1 tablet (15 mg total) by mouth at bedtime. 30 tablet 3  . Multiple Vitamins-Minerals (PRESERVISION AREDS 2) CAPS Take 1 capsule by mouth daily.    .  pantoprazole (PROTONIX) 40 MG tablet Take 1 tablet (40 mg total) by mouth daily. 30 tablet 1  . traMADol (ULTRAM) 50 MG tablet Take 50 mg by mouth at bedtime.     . simvastatin (ZOCOR) 80 MG tablet Take 80 mg by mouth at bedtime.     . Multiple Vitamins-Minerals (PRESERVISION AREDS 2) CAPS Take 1 capsule by mouth at bedtime.     No facility-administered medications prior to visit.      Allergies:   Penicillins   Social History   Social History  . Marital status: Married    Spouse name: N/A  . Number of children: N/A  . Years of education: N/A   Occupational History  . Retired Engineer, civil (consulting)urse    Social History Main Topics  . Smoking status: Former Games developermoker  . Smokeless tobacco: Never Used     Comment: Smoked for <10 years  . Alcohol use No  . Drug use: No  . Sexual activity: Not Asked   Other Topics Concern  . None   Social History Narrative  . None     Family History:  The patient's family history includes CAD in her brother, brother, and father; Diabetes in her mother; Hypertension in her mother.     ROS:   Please see the history of present illness.    ROS All other systems reviewed and are negative.   PHYSICAL EXAM:   VS:  BP (!) 167/71   Pulse 94   Ht 5\' 1"  (1.549 m)   Wt 197 lb (89.4 kg)   SpO2 96%   BMI 37.22 kg/m    GEN: Well nourished, well developed, in no acute distress obese HEENT: normal  Neck: no JVD, bilateral  carotid bruits, or masses Cardiac: RRR; AS  murmurs, rubs, or gallops,trace LE edema  Respiratory:  clear to auscultation bilaterally, normal work of breathing GI: soft, nontender, nondistended, + BS MS: no deformity or atrophy  Skin: warm and dry, erythematous rash on her back and abdomen that extends under her breasts and axilla.  Neuro:  Alert and Oriented x 3, Strength and sensation are intact Psych: euthymic mood, full affect  Wt Readings from Last 3 Encounters:  09/18/16 197 lb (89.4 kg)  04/06/16 192 lb (87.1 kg)  03/23/16 202 lb 4.8 oz  (91.8 kg)      Studies/Labs Reviewed:   EKG:   SR LAD poor R wave progression 03/22/16   Recent Labs: 03/19/2016: B Natriuretic Peptide 349.2; TSH 1.323 03/21/2016: ALT 27; Magnesium 2.4 03/23/2016: Hemoglobin 9.0; Platelets 220 04/06/2016: BUN 43; Creat 2.01; Potassium 4.2; Sodium 142   Lipid Panel  No results found for: CHOL, TRIG, HDL, CHOLHDL, VLDL, LDLCALC, LDLDIRECT  Additional studies/ records that were reviewed today include:  2D ECHO:03/15/16 Study Conclusions - Left ventricle: The cavity size was normal. Wall thickness was increased in a pattern of mild LVH. Systolic function was normal.The estimated ejection fraction was in the range of 60% to 65%. Wall motion was normal; there were no regional wall motion abnormalities. Features are consistent with a pseudonormal left ventricular filling pattern, with concomitant abnormal relaxation and increased filling pressure (grade 2 diastolic dysfunction). - Aortic valve: Trileaflet; moderately calcified leaflets. There  was mild stenosis. Mean gradient (S): 16 mm Hg. - Mitral valve: Mildly calcified annulus. Mildly calcified leaflets  . There was mild regurgitation. - Left atrium: The atrium was mildly dilated. - Right ventricle: The cavity size was normal. Systolic function was normal. - Pulmonary arteries: PA peak pressure: 41 mm Hg (S). - Inferior vena cava: The vessel was normal in size. The  respirophasic diameter changes were in the normal range (>= 50%), consistent with normal central venous pressure. Impressions: - Normal LV size with mild LV hypertrophy. EF 60-65%. Moderate diastolic dysfunction. Normal RV size and systolic function. Mild aortic stenosis. Mild pulmonary hypertension.   ASSESSMENT & PLAN:   Chronic diastolic heart failure - 2-D echo on 6/7 with EF of 60-65% with grade 2 diastolic dysfunction. Discharged on home dose of Lasix 80mg  BID. Her weight is down. She appears euvolemic. Bacteremia due to  StreptococcusLikely from ?HCAP/CAP - Blood cultures positive for Streptococcus, ? Source - Chest x-ray 6/13 shows mild right basilar atelectasis lungs otherwise clear. Chest x-ray on admission 6/11 had shown patchy right-sided airspace opacity - Patient received IV vancomycin and aztreonam through the hospitalization, transitioned to oral levofloxacin QOD for 7 more doses to complete full course for bacteremia. 2-D echo did not show any vegetations.   HTN: increase amlodipine to 10 mg   Chest pressure and DOE: last seen by PA imdur added. Patient did not want cath with CRF and risk of dialysis don't think chest pain at night is angina but will get lexiscan advised her to f/u with primary For GI referral for reflux and nightly GERD   CKD (chronic kidney disease), stage III-IV - Baseline appears to be around 2.2-2.3. Creat at discharge 1.88  - Continue PO Lasix 80mg  BID. BMET today - Follows Dr. Abel Prestoolodonato, does not want HD  Diabetes mellitus type 2: Last HgA1c 7.8  Hypothyroidism: Continue Synthroid 25 MCG daily   Carotid artery disease: 08/23/16  duplex w/ 40-59% bilateral ICA stenosis f/u duplex 08/2017   Mild aortic stenosis: Trileaflet; moderately calcified leaflets. There was mild stenosis. Mean gradient (S): 16 mm Hg. Repeat 2D ECHO in 03/2017   Charlton HawsPeter Sinclair [place]

## 2016-09-18 ENCOUNTER — Encounter: Payer: Self-pay | Admitting: Cardiovascular Disease

## 2016-09-18 ENCOUNTER — Ambulatory Visit (INDEPENDENT_AMBULATORY_CARE_PROVIDER_SITE_OTHER): Payer: PPO | Admitting: Cardiovascular Disease

## 2016-09-18 VITALS — BP 167/71 | HR 94 | Ht 61.0 in | Wt 197.0 lb

## 2016-09-18 DIAGNOSIS — R0789 Other chest pain: Secondary | ICD-10-CM

## 2016-09-18 DIAGNOSIS — I5033 Acute on chronic diastolic (congestive) heart failure: Secondary | ICD-10-CM | POA: Diagnosis not present

## 2016-09-18 MED ORDER — ATORVASTATIN CALCIUM 40 MG PO TABS: 40.0000 mg | ORAL_TABLET | Freq: Every day | ORAL | 3 refills | Status: AC

## 2016-09-18 MED ORDER — AMLODIPINE BESYLATE 10 MG PO TABS: 10.0000 mg | ORAL_TABLET | Freq: Every day | ORAL | 3 refills | Status: AC

## 2016-09-18 NOTE — Patient Instructions (Addendum)
Medication Instructions:  Your physician has recommended you make the following change in your medication:  1-START Norvasc 10 mg by mouth daily. 2-STOP Simvastatin 3- START Lipitor 40 mg by mouth daily  Labwork: NONE  Testing/Procedures: Your physician has requested that you have a lexiscan myoview. For further information please visit [email or web address]. Please follow instruction sheet, as given.  Follow-Up: Your physician wants you to follow-up in: 3  months with Dr. Eden EmmsNishan.    If you need a refill on your cardiac medications before your next appointment, please call your pharmacy.

## 2016-09-26 ENCOUNTER — Telehealth (HOSPITAL_COMMUNITY): Payer: Self-pay | Admitting: *Deleted

## 2016-09-26 NOTE — Telephone Encounter (Signed)
Patient given detailed instructions per Myocardial Perfusion Study Information Sheet for the test on 09/29/16 at 0930. Patient notified to arrive 15 minutes early and that it is imperative to arrive on time for appointment to keep from having the test rescheduled.  If you need to cancel or reschedule your appointment, please call the office within 24 hours of your appointment. Failure to do so may result in a cancellation of your appointment, and a $50 no show fee. Patient verbalized understanding.Emmajane Altamura, Adelene IdlerCynthia W

## 2016-09-28 ENCOUNTER — Encounter (HOSPITAL_COMMUNITY): Payer: PPO

## 2016-09-29 ENCOUNTER — Ambulatory Visit (HOSPITAL_COMMUNITY): Payer: PPO | Attending: Cardiology

## 2016-09-29 DIAGNOSIS — I447 Left bundle-branch block, unspecified: Secondary | ICD-10-CM | POA: Diagnosis not present

## 2016-09-29 DIAGNOSIS — R9439 Abnormal result of other cardiovascular function study: Secondary | ICD-10-CM | POA: Diagnosis not present

## 2016-09-29 DIAGNOSIS — R0609 Other forms of dyspnea: Secondary | ICD-10-CM | POA: Insufficient documentation

## 2016-09-29 DIAGNOSIS — I10 Essential (primary) hypertension: Secondary | ICD-10-CM | POA: Diagnosis not present

## 2016-09-29 DIAGNOSIS — I5033 Acute on chronic diastolic (congestive) heart failure: Secondary | ICD-10-CM | POA: Diagnosis not present

## 2016-09-29 DIAGNOSIS — R0789 Other chest pain: Secondary | ICD-10-CM | POA: Diagnosis not present

## 2016-09-29 LAB — MYOCARDIAL PERFUSION IMAGING
CHL CUP NUCLEAR SRS: 3
CHL CUP NUCLEAR SSS: 5
LHR: 0.41
LV sys vol: 45 mL
LVDIAVOL: 103 mL (ref 46–106)
NUC STRESS TID: 1.15
Peak HR: 96 {beats}/min
Rest HR: 83 {beats}/min
SDS: 4

## 2016-09-29 MED ORDER — AMINOPHYLLINE 25 MG/ML IV SOLN
75.0000 mg | Freq: Once | INTRAVENOUS | Status: AC
  Administered 2016-09-29: 75 mg via INTRAVENOUS

## 2016-09-29 MED ORDER — TECHNETIUM TC 99M TETROFOSMIN IV KIT
9.1000 | PACK | Freq: Once | INTRAVENOUS | Status: AC | PRN
  Administered 2016-09-29: 9.1 via INTRAVENOUS
  Filled 2016-09-29: qty 10

## 2016-09-29 MED ORDER — TECHNETIUM TC 99M TETROFOSMIN IV KIT
32.7000 | PACK | Freq: Once | INTRAVENOUS | Status: AC | PRN
  Administered 2016-09-29: 32.7 via INTRAVENOUS
  Filled 2016-09-29: qty 33

## 2016-09-29 MED ORDER — REGADENOSON 0.4 MG/5ML IV SOLN
0.4000 mg | Freq: Once | INTRAVENOUS | Status: AC
  Administered 2016-09-29: 0.4 mg via INTRAVENOUS

## 2016-10-07 ENCOUNTER — Inpatient Hospital Stay (HOSPITAL_COMMUNITY)
Admission: EM | Admit: 2016-10-07 | Discharge: 2016-11-09 | DRG: 871 | Disposition: E | Payer: PPO | Attending: Emergency Medicine | Admitting: Emergency Medicine

## 2016-10-07 ENCOUNTER — Inpatient Hospital Stay (HOSPITAL_COMMUNITY): Payer: PPO

## 2016-10-07 ENCOUNTER — Emergency Department (HOSPITAL_COMMUNITY): Payer: PPO

## 2016-10-07 DIAGNOSIS — I130 Hypertensive heart and chronic kidney disease with heart failure and stage 1 through stage 4 chronic kidney disease, or unspecified chronic kidney disease: Secondary | ICD-10-CM | POA: Diagnosis present

## 2016-10-07 DIAGNOSIS — J9690 Respiratory failure, unspecified, unspecified whether with hypoxia or hypercapnia: Secondary | ICD-10-CM

## 2016-10-07 DIAGNOSIS — N289 Disorder of kidney and ureter, unspecified: Secondary | ICD-10-CM | POA: Diagnosis not present

## 2016-10-07 DIAGNOSIS — R6521 Severe sepsis with septic shock: Secondary | ICD-10-CM | POA: Diagnosis present

## 2016-10-07 DIAGNOSIS — G9341 Metabolic encephalopathy: Secondary | ICD-10-CM | POA: Diagnosis not present

## 2016-10-07 DIAGNOSIS — J9601 Acute respiratory failure with hypoxia: Secondary | ICD-10-CM | POA: Diagnosis not present

## 2016-10-07 DIAGNOSIS — K7200 Acute and subacute hepatic failure without coma: Secondary | ICD-10-CM | POA: Diagnosis present

## 2016-10-07 DIAGNOSIS — Z9119 Patient's noncompliance with other medical treatment and regimen: Secondary | ICD-10-CM

## 2016-10-07 DIAGNOSIS — E871 Hypo-osmolality and hyponatremia: Secondary | ICD-10-CM | POA: Diagnosis not present

## 2016-10-07 DIAGNOSIS — J690 Pneumonitis due to inhalation of food and vomit: Secondary | ICD-10-CM | POA: Diagnosis not present

## 2016-10-07 DIAGNOSIS — R34 Anuria and oliguria: Secondary | ICD-10-CM | POA: Diagnosis not present

## 2016-10-07 DIAGNOSIS — I5033 Acute on chronic diastolic (congestive) heart failure: Secondary | ICD-10-CM | POA: Diagnosis not present

## 2016-10-07 DIAGNOSIS — E039 Hypothyroidism, unspecified: Secondary | ICD-10-CM | POA: Diagnosis present

## 2016-10-07 DIAGNOSIS — I447 Left bundle-branch block, unspecified: Secondary | ICD-10-CM | POA: Diagnosis present

## 2016-10-07 DIAGNOSIS — I469 Cardiac arrest, cause unspecified: Secondary | ICD-10-CM | POA: Diagnosis present

## 2016-10-07 DIAGNOSIS — E1165 Type 2 diabetes mellitus with hyperglycemia: Secondary | ICD-10-CM | POA: Diagnosis present

## 2016-10-07 DIAGNOSIS — R001 Bradycardia, unspecified: Secondary | ICD-10-CM | POA: Diagnosis present

## 2016-10-07 DIAGNOSIS — R7989 Other specified abnormal findings of blood chemistry: Secondary | ICD-10-CM

## 2016-10-07 DIAGNOSIS — Z87891 Personal history of nicotine dependence: Secondary | ICD-10-CM

## 2016-10-07 DIAGNOSIS — N390 Urinary tract infection, site not specified: Secondary | ICD-10-CM | POA: Diagnosis present

## 2016-10-07 DIAGNOSIS — R6520 Severe sepsis without septic shock: Secondary | ICD-10-CM

## 2016-10-07 DIAGNOSIS — Z66 Do not resuscitate: Secondary | ICD-10-CM | POA: Diagnosis not present

## 2016-10-07 DIAGNOSIS — M109 Gout, unspecified: Secondary | ICD-10-CM | POA: Diagnosis present

## 2016-10-07 DIAGNOSIS — Z4659 Encounter for fitting and adjustment of other gastrointestinal appliance and device: Secondary | ICD-10-CM

## 2016-10-07 DIAGNOSIS — D638 Anemia in other chronic diseases classified elsewhere: Secondary | ICD-10-CM | POA: Diagnosis present

## 2016-10-07 DIAGNOSIS — Z794 Long term (current) use of insulin: Secondary | ICD-10-CM

## 2016-10-07 DIAGNOSIS — E785 Hyperlipidemia, unspecified: Secondary | ICD-10-CM | POA: Diagnosis present

## 2016-10-07 DIAGNOSIS — A419 Sepsis, unspecified organism: Secondary | ICD-10-CM

## 2016-10-07 DIAGNOSIS — Z880 Allergy status to penicillin: Secondary | ICD-10-CM

## 2016-10-07 DIAGNOSIS — J189 Pneumonia, unspecified organism: Secondary | ICD-10-CM

## 2016-10-07 DIAGNOSIS — Z7982 Long term (current) use of aspirin: Secondary | ICD-10-CM

## 2016-10-07 DIAGNOSIS — E872 Acidosis: Secondary | ICD-10-CM | POA: Diagnosis not present

## 2016-10-07 DIAGNOSIS — R042 Hemoptysis: Secondary | ICD-10-CM | POA: Diagnosis not present

## 2016-10-07 DIAGNOSIS — I214 Non-ST elevation (NSTEMI) myocardial infarction: Secondary | ICD-10-CM | POA: Diagnosis present

## 2016-10-07 DIAGNOSIS — Z452 Encounter for adjustment and management of vascular access device: Secondary | ICD-10-CM | POA: Diagnosis not present

## 2016-10-07 DIAGNOSIS — E1122 Type 2 diabetes mellitus with diabetic chronic kidney disease: Secondary | ICD-10-CM | POA: Diagnosis not present

## 2016-10-07 DIAGNOSIS — J80 Acute respiratory distress syndrome: Secondary | ICD-10-CM

## 2016-10-07 DIAGNOSIS — B961 Klebsiella pneumoniae [K. pneumoniae] as the cause of diseases classified elsewhere: Secondary | ICD-10-CM | POA: Diagnosis not present

## 2016-10-07 DIAGNOSIS — R579 Shock, unspecified: Secondary | ICD-10-CM | POA: Insufficient documentation

## 2016-10-07 DIAGNOSIS — R778 Other specified abnormalities of plasma proteins: Secondary | ICD-10-CM

## 2016-10-07 DIAGNOSIS — N170 Acute kidney failure with tubular necrosis: Secondary | ICD-10-CM | POA: Diagnosis not present

## 2016-10-07 DIAGNOSIS — J9600 Acute respiratory failure, unspecified whether with hypoxia or hypercapnia: Secondary | ICD-10-CM

## 2016-10-07 DIAGNOSIS — N184 Chronic kidney disease, stage 4 (severe): Secondary | ICD-10-CM | POA: Diagnosis not present

## 2016-10-07 DIAGNOSIS — Z6841 Body Mass Index (BMI) 40.0 and over, adult: Secondary | ICD-10-CM

## 2016-10-07 DIAGNOSIS — N2581 Secondary hyperparathyroidism of renal origin: Secondary | ICD-10-CM | POA: Diagnosis not present

## 2016-10-07 DIAGNOSIS — Z8249 Family history of ischemic heart disease and other diseases of the circulatory system: Secondary | ICD-10-CM

## 2016-10-07 DIAGNOSIS — Z4682 Encounter for fitting and adjustment of non-vascular catheter: Secondary | ICD-10-CM | POA: Diagnosis not present

## 2016-10-07 DIAGNOSIS — Z91199 Patient's noncompliance with other medical treatment and regimen due to unspecified reason: Secondary | ICD-10-CM

## 2016-10-07 DIAGNOSIS — R0603 Acute respiratory distress: Secondary | ICD-10-CM | POA: Diagnosis not present

## 2016-10-07 DIAGNOSIS — R069 Unspecified abnormalities of breathing: Secondary | ICD-10-CM | POA: Diagnosis not present

## 2016-10-07 DIAGNOSIS — E669 Obesity, unspecified: Secondary | ICD-10-CM | POA: Diagnosis present

## 2016-10-07 DIAGNOSIS — R748 Abnormal levels of other serum enzymes: Secondary | ICD-10-CM | POA: Diagnosis not present

## 2016-10-07 LAB — I-STAT TROPONIN, ED: Troponin i, poc: 3.64 ng/mL (ref 0.00–0.08)

## 2016-10-07 LAB — I-STAT ARTERIAL BLOOD GAS, ED
Acid-base deficit: 2 mmol/L (ref 0.0–2.0)
Bicarbonate: 24.7 mmol/L (ref 20.0–28.0)
O2 Saturation: 94 %
Patient temperature: 97.5
TCO2: 26 mmol/L (ref 0–100)
pCO2 arterial: 46.1 mmHg (ref 32.0–48.0)
pH, Arterial: 7.334 — ABNORMAL LOW (ref 7.350–7.450)
pO2, Arterial: 74 mmHg — ABNORMAL LOW (ref 83.0–108.0)

## 2016-10-07 LAB — URINALYSIS, ROUTINE W REFLEX MICROSCOPIC
Bilirubin Urine: NEGATIVE
Glucose, UA: NEGATIVE mg/dL
Hgb urine dipstick: NEGATIVE
Ketones, ur: NEGATIVE mg/dL
Nitrite: NEGATIVE
Protein, ur: 100 mg/dL — AB
Specific Gravity, Urine: 1.011 (ref 1.005–1.030)
pH: 5 (ref 5.0–8.0)

## 2016-10-07 LAB — COMPREHENSIVE METABOLIC PANEL
ALT: 20 U/L (ref 14–54)
AST: 65 U/L — ABNORMAL HIGH (ref 15–41)
Albumin: 2.8 g/dL — ABNORMAL LOW (ref 3.5–5.0)
Alkaline Phosphatase: 94 U/L (ref 38–126)
Anion gap: 14 (ref 5–15)
BUN: 65 mg/dL — ABNORMAL HIGH (ref 6–20)
CO2: 24 mmol/L (ref 22–32)
Calcium: 8.9 mg/dL (ref 8.9–10.3)
Chloride: 98 mmol/L — ABNORMAL LOW (ref 101–111)
Creatinine, Ser: 2.17 mg/dL — ABNORMAL HIGH (ref 0.44–1.00)
GFR calc Af Amer: 23 mL/min — ABNORMAL LOW (ref >60)
GFR calc non Af Amer: 20 mL/min — ABNORMAL LOW (ref >60)
Glucose, Bld: 313 mg/dL — ABNORMAL HIGH (ref 65–99)
Potassium: 4.4 mmol/L (ref 3.5–5.1)
Sodium: 136 mmol/L (ref 135–145)
Total Bilirubin: 1 mg/dL (ref 0.3–1.2)
Total Protein: 6.5 g/dL (ref 6.5–8.1)

## 2016-10-07 LAB — CBC WITH DIFFERENTIAL/PLATELET
Basophils Absolute: 0 10*3/uL (ref 0.0–0.1)
Basophils Relative: 0 %
Eosinophils Absolute: 0.1 10*3/uL (ref 0.0–0.7)
Eosinophils Relative: 0 %
HCT: 34 % — ABNORMAL LOW (ref 36.0–46.0)
Hemoglobin: 11.3 g/dL — ABNORMAL LOW (ref 12.0–15.0)
Lymphocytes Relative: 18 %
Lymphs Abs: 4.3 10*3/uL — ABNORMAL HIGH (ref 0.7–4.0)
MCH: 32.5 pg (ref 26.0–34.0)
MCHC: 33.2 g/dL (ref 30.0–36.0)
MCV: 97.7 fL (ref 78.0–100.0)
Monocytes Absolute: 2.4 10*3/uL — ABNORMAL HIGH (ref 0.1–1.0)
Monocytes Relative: 10 %
Neutro Abs: 16.7 10*3/uL — ABNORMAL HIGH (ref 1.7–7.7)
Neutrophils Relative %: 72 %
Platelets: 331 10*3/uL (ref 150–400)
RBC: 3.48 MIL/uL — ABNORMAL LOW (ref 3.87–5.11)
RDW: 15.5 % (ref 11.5–15.5)
WBC: 23.5 10*3/uL — ABNORMAL HIGH (ref 4.0–10.5)

## 2016-10-07 LAB — GLUCOSE, CAPILLARY
GLUCOSE-CAPILLARY: 339 mg/dL — AB (ref 65–99)
Glucose-Capillary: 378 mg/dL — ABNORMAL HIGH (ref 65–99)

## 2016-10-07 LAB — MRSA PCR SCREENING: MRSA by PCR: NEGATIVE

## 2016-10-07 LAB — LACTIC ACID, PLASMA: LACTIC ACID, VENOUS: 2.1 mmol/L — AB (ref 0.5–1.9)

## 2016-10-07 LAB — STREP PNEUMONIAE URINARY ANTIGEN: Strep Pneumo Urinary Antigen: NEGATIVE

## 2016-10-07 LAB — PROTIME-INR
INR: 1.18
Prothrombin Time: 15.1 seconds (ref 11.4–15.2)

## 2016-10-07 LAB — MAGNESIUM: Magnesium: 2.5 mg/dL — ABNORMAL HIGH (ref 1.7–2.4)

## 2016-10-07 LAB — I-STAT CG4 LACTIC ACID, ED
LACTIC ACID, VENOUS: 6.61 mmol/L — AB (ref 0.5–1.9)
Lactic Acid, Venous: 2.14 mmol/L (ref 0.5–1.9)

## 2016-10-07 LAB — PHOSPHORUS: Phosphorus: 5.1 mg/dL — ABNORMAL HIGH (ref 2.5–4.6)

## 2016-10-07 LAB — CBG MONITORING, ED: Glucose-Capillary: 318 mg/dL — ABNORMAL HIGH (ref 65–99)

## 2016-10-07 LAB — PROCALCITONIN: PROCALCITONIN: 6.1 ng/mL

## 2016-10-07 LAB — TROPONIN I: TROPONIN I: 5.65 ng/mL — AB (ref <0.03)

## 2016-10-07 MED ORDER — LEVOFLOXACIN IN D5W 750 MG/150ML IV SOLN
750.0000 mg | Freq: Once | INTRAVENOUS | Status: AC
  Administered 2016-10-07: 750 mg via INTRAVENOUS
  Filled 2016-10-07: qty 150

## 2016-10-07 MED ORDER — LEVOTHYROXINE SODIUM 25 MCG PO TABS
25.0000 ug | ORAL_TABLET | Freq: Every day | ORAL | Status: DC
  Administered 2016-10-08 – 2016-10-10 (×3): 25 ug
  Filled 2016-10-07 (×3): qty 1

## 2016-10-07 MED ORDER — MIDAZOLAM HCL 2 MG/2ML IJ SOLN
INTRAMUSCULAR | Status: AC
  Filled 2016-10-07: qty 2

## 2016-10-07 MED ORDER — LEVOFLOXACIN IN D5W 500 MG/100ML IV SOLN
500.0000 mg | INTRAVENOUS | Status: DC
  Administered 2016-10-09: 500 mg via INTRAVENOUS
  Filled 2016-10-07: qty 100

## 2016-10-07 MED ORDER — SODIUM CHLORIDE 0.9 % IV SOLN: 750.0000 mL | INTRAVENOUS | Status: DC | PRN

## 2016-10-07 MED ORDER — ORAL CARE MOUTH RINSE
15.0000 mL | Freq: Four times a day (QID) | OROMUCOSAL | Status: DC
  Administered 2016-10-08 – 2016-10-10 (×10): 15 mL via OROMUCOSAL

## 2016-10-07 MED ORDER — SODIUM CHLORIDE 0.9 % IV BOLUS (SEPSIS)
30.0000 mL/kg | Freq: Once | INTRAVENOUS | Status: AC
  Administered 2016-10-07: 1000 mL via INTRAVENOUS

## 2016-10-07 MED ORDER — CHLORHEXIDINE GLUCONATE 0.12% ORAL RINSE (MEDLINE KIT)
15.0000 mL | Freq: Two times a day (BID) | OROMUCOSAL | Status: DC
  Administered 2016-10-07 – 2016-10-10 (×6): 15 mL via OROMUCOSAL

## 2016-10-07 MED ORDER — FENTANYL CITRATE (PF) 100 MCG/2ML IJ SOLN
50.0000 ug | INTRAMUSCULAR | Status: DC | PRN
  Administered 2016-10-08 – 2016-10-09 (×5): 50 ug via INTRAVENOUS
  Filled 2016-10-07 (×7): qty 2

## 2016-10-07 MED ORDER — ASPIRIN 300 MG RE SUPP: 300.0000 mg | Freq: Once | RECTAL | Status: DC

## 2016-10-07 MED ORDER — FENTANYL CITRATE (PF) 100 MCG/2ML IJ SOLN
50.0000 ug | INTRAMUSCULAR | Status: AC | PRN
Start: 2016-10-07 — End: 2016-10-08
  Administered 2016-10-07 – 2016-10-08 (×3): 50 ug via INTRAVENOUS
  Filled 2016-10-07: qty 2

## 2016-10-07 MED ORDER — VANCOMYCIN HCL IN DEXTROSE 1-5 GM/200ML-% IV SOLN
1000.0000 mg | Freq: Once | INTRAVENOUS | Status: DC
  Filled 2016-10-07: qty 200

## 2016-10-07 MED ORDER — HEPARIN BOLUS VIA INFUSION
4000.0000 [IU] | Freq: Once | INTRAVENOUS | Status: AC
  Administered 2016-10-07: 4000 [IU] via INTRAVENOUS
  Filled 2016-10-07: qty 4000

## 2016-10-07 MED ORDER — VANCOMYCIN HCL IN DEXTROSE 1-5 GM/200ML-% IV SOLN
1000.0000 mg | INTRAVENOUS | Status: DC
  Administered 2016-10-07 – 2016-10-09 (×3): 1000 mg via INTRAVENOUS
  Filled 2016-10-07 (×3): qty 200

## 2016-10-07 MED ORDER — FENTANYL CITRATE (PF) 100 MCG/2ML IJ SOLN
INTRAMUSCULAR | Status: AC
  Filled 2016-10-07: qty 2

## 2016-10-07 MED ORDER — NOREPINEPHRINE BITARTRATE 1 MG/ML IV SOLN
0.0000 ug/min | INTRAVENOUS | Status: DC
  Administered 2016-10-07: 20 ug/min via INTRAVENOUS
  Administered 2016-10-08: 18 ug/min via INTRAVENOUS
  Administered 2016-10-08: 12 ug/min via INTRAVENOUS
  Administered 2016-10-09: 20 ug/min via INTRAVENOUS
  Administered 2016-10-09: 12 ug/min via INTRAVENOUS
  Administered 2016-10-09: 22 ug/min via INTRAVENOUS
  Filled 2016-10-07 (×11): qty 4

## 2016-10-07 MED ORDER — HEPARIN SODIUM (PORCINE) 5000 UNIT/ML IJ SOLN
INTRAMUSCULAR | Status: AC
  Filled 2016-10-07: qty 1

## 2016-10-07 MED ORDER — MIDAZOLAM HCL 2 MG/2ML IJ SOLN
2.0000 mg | INTRAMUSCULAR | Status: AC | PRN
  Administered 2016-10-07 (×3): 2 mg via INTRAVENOUS
  Filled 2016-10-07 (×2): qty 2

## 2016-10-07 MED ORDER — SODIUM CHLORIDE 0.9% FLUSH
10.0000 mL | Freq: Two times a day (BID) | INTRAVENOUS | Status: DC
  Administered 2016-10-07 – 2016-10-09 (×4): 10 mL

## 2016-10-07 MED ORDER — SODIUM CHLORIDE 0.9% FLUSH: 10.0000 mL | INTRAVENOUS | Status: DC | PRN

## 2016-10-07 MED ORDER — PANTOPRAZOLE SODIUM 40 MG IV SOLR
40.0000 mg | Freq: Two times a day (BID) | INTRAVENOUS | Status: DC
  Administered 2016-10-07 – 2016-10-08 (×2): 40 mg via INTRAVENOUS
  Filled 2016-10-07 (×2): qty 40

## 2016-10-07 MED ORDER — PROPOFOL 1000 MG/100ML IV EMUL
5.0000 ug/kg/min | INTRAVENOUS | Status: DC
  Administered 2016-10-07: 15:00:00 via INTRAVENOUS

## 2016-10-07 MED ORDER — ASPIRIN 300 MG RE SUPP: 300.0000 mg | RECTAL | Status: DC

## 2016-10-07 MED ORDER — EPINEPHRINE PF 1 MG/10ML IJ SOSY
PREFILLED_SYRINGE | INTRAMUSCULAR | Status: AC | PRN
  Administered 2016-10-07: 1 mg via INTRAVENOUS

## 2016-10-07 MED ORDER — PROPOFOL 1000 MG/100ML IV EMUL
5.0000 ug/kg/min | INTRAVENOUS | Status: DC
  Administered 2016-10-07: 5 ug/kg/min via INTRAVENOUS
  Administered 2016-10-08: 15 ug/kg/min via INTRAVENOUS
  Administered 2016-10-08: 20 ug/kg/min via INTRAVENOUS
  Administered 2016-10-09: 60 ug/kg/min via INTRAVENOUS
  Administered 2016-10-09: 15 ug/kg/min via INTRAVENOUS
  Administered 2016-10-09 (×2): 40 ug/kg/min via INTRAVENOUS
  Filled 2016-10-07 (×8): qty 100

## 2016-10-07 MED ORDER — SODIUM BICARBONATE 8.4 % IV SOLN
INTRAVENOUS | Status: AC | PRN
  Administered 2016-10-07: 50 meq via INTRAVENOUS

## 2016-10-07 MED ORDER — DEXTROSE 5 % IV SOLN
2.0000 g | Freq: Once | INTRAVENOUS | Status: AC
  Administered 2016-10-07: 2 g via INTRAVENOUS
  Filled 2016-10-07: qty 2

## 2016-10-07 MED ORDER — ASPIRIN 81 MG PO CHEW: 324.0000 mg | CHEWABLE_TABLET | ORAL | Status: DC

## 2016-10-07 MED ORDER — FAMOTIDINE IN NACL 20-0.9 MG/50ML-% IV SOLN: 20.0000 mg | Freq: Two times a day (BID) | INTRAVENOUS | Status: DC

## 2016-10-07 MED ORDER — PROPOFOL 1000 MG/100ML IV EMUL
INTRAVENOUS | Status: AC
  Filled 2016-10-07: qty 100

## 2016-10-07 MED ORDER — INSULIN ASPART 100 UNIT/ML ~~LOC~~ SOLN
0.0000 [IU] | SUBCUTANEOUS | Status: DC
  Administered 2016-10-07: 15 [IU] via SUBCUTANEOUS
  Administered 2016-10-08: 3 [IU] via SUBCUTANEOUS
  Administered 2016-10-08: 11 [IU] via SUBCUTANEOUS
  Administered 2016-10-08: 5 [IU] via SUBCUTANEOUS
  Administered 2016-10-08: 8 [IU] via SUBCUTANEOUS
  Administered 2016-10-08: 11 [IU] via SUBCUTANEOUS
  Administered 2016-10-08: 3 [IU] via SUBCUTANEOUS
  Administered 2016-10-09: 11 [IU] via SUBCUTANEOUS
  Administered 2016-10-09: 8 [IU] via SUBCUTANEOUS
  Administered 2016-10-09: 11 [IU] via SUBCUTANEOUS

## 2016-10-07 MED ORDER — DEXTROSE 5 % IV SOLN
1.0000 g | Freq: Three times a day (TID) | INTRAVENOUS | Status: DC
  Administered 2016-10-08 – 2016-10-10 (×8): 1 g via INTRAVENOUS
  Filled 2016-10-07 (×10): qty 1

## 2016-10-07 MED ORDER — MIDAZOLAM HCL 2 MG/2ML IJ SOLN
2.0000 mg | INTRAMUSCULAR | Status: DC | PRN
  Administered 2016-10-07 – 2016-10-09 (×2): 2 mg via INTRAVENOUS
  Filled 2016-10-07 (×2): qty 2

## 2016-10-07 MED ORDER — ALBUTEROL SULFATE (2.5 MG/3ML) 0.083% IN NEBU
2.5000 mg | INHALATION_SOLUTION | RESPIRATORY_TRACT | Status: DC
  Administered 2016-10-07 – 2016-10-08 (×5): 2.5 mg via RESPIRATORY_TRACT
  Filled 2016-10-07 (×4): qty 3

## 2016-10-07 MED ORDER — ROCURONIUM BROMIDE 50 MG/5ML IV SOLN
INTRAVENOUS | Status: AC | PRN
  Administered 2016-10-07: 60 mg via INTRAVENOUS

## 2016-10-07 MED ORDER — ETOMIDATE 2 MG/ML IV SOLN
INTRAVENOUS | Status: AC | PRN
  Administered 2016-10-07: 20 mg via INTRAVENOUS

## 2016-10-07 MED ORDER — SODIUM CHLORIDE 0.9 % IV SOLN
INTRAVENOUS | Status: DC
  Administered 2016-10-07: 19:00:00 via INTRAVENOUS

## 2016-10-07 MED ORDER — HEPARIN (PORCINE) IN NACL 100-0.45 UNIT/ML-% IJ SOLN
700.0000 [IU]/h | INTRAMUSCULAR | Status: DC
  Administered 2016-10-07: 950 [IU]/h via INTRAVENOUS
  Administered 2016-10-08 – 2016-10-09 (×2): 700 [IU]/h via INTRAVENOUS
  Filled 2016-10-07 (×3): qty 250

## 2016-10-07 NOTE — Progress Notes (Signed)
Pt. Transported on Vent from ED-Trauma-B to [id number] uneventfully.

## 2016-10-07 NOTE — Progress Notes (Signed)
Pharmacy Antibiotic Note  Donna Hurst is a 82 y.o. female admitted on 10/01/2016 with sepsis.  Pharmacy has been consulted for aztreonam, levaquin and vancomycin dosing.  Plan: Vancomycin 1g IV every 24 hours.  Goal trough 15-20 mcg/mL.  Aztreonam 1g IV q8h Levaquin 500mg  IV q48h Monitor culture data, renal function and clinical course VT at SS prn     Temp (24hrs), Avg:100.7 F (38.2 C), Min:100.7 F (38.2 C), Max:100.7 F (38.2 C)   Recent Labs Lab 09/26/2016 1453 09/08/2016 1506  WBC 23.5*  --   CREATININE 2.17*  --   LATICACIDVEN  --  6.61*    CrCl cannot be calculated (Patient's most recent lab result is older than the maximum 21 days allowed.).    Allergies  Allergen Reactions  . Penicillins Hives and Swelling    Has patient had a PCN reaction causing immediate rash, facial/tongue/throat swelling, SOB or lightheadedness with hypotension: Yes Has patient had a PCN reaction causing severe rash involving mucus membranes or skin necrosis: No Has patient had a PCN reaction that required hospitalization No Has patient had a PCN reaction occurring within the last 10 years: No If all of the above answers are "NO", then may proceed with Cephalosporin use.     Arlean Hoppingorey M. Newman PiesBall, PharmD, BCPS Clinical Pharmacist Pager [phone] 09/27/2016 3:18 PM

## 2016-10-07 NOTE — Progress Notes (Signed)
eLink Physician-Brief Progress Note Patient Name: Donna Hurst DOB: 05-27-1932 MRN: [id number]   Date of Service  August 06, 2016  HPI/Events of Note  Patient attempted self extubation - Request for bilateral soft wrist restraints and additional sedation.   eICU Interventions  Will order: 1. Bilateral soft wrist restraints.  2. Propofol IV infusion. Titrate to RASS = 0.     Intervention Category Minor Interventions: Agitation / anxiety - evaluation and management  Lenell AntuSommer,Steven Eugene August 06, 2016, 9:36 PM

## 2016-10-07 NOTE — Progress Notes (Signed)
ANTICOAGULATION CONSULT NOTE - Initial Consult  Pharmacy Consult for Heparin Indication: chest pain/ACS  Allergies  Allergen Reactions  . Penicillins Hives and Swelling    Has patient had a PCN reaction causing immediate rash, facial/tongue/throat swelling, SOB or lightheadedness with hypotension: Yes Has patient had a PCN reaction causing severe rash involving mucus membranes or skin necrosis: No Has patient had a PCN reaction that required hospitalization No Has patient had a PCN reaction occurring within the last 10 years: No If all of the above answers are "NO", then may proceed with Cephalosporin use.    Patient Measurements:   Heparin Dosing Weight: 69 kg  Vital Signs: Temp: 100.7 F (38.2 C) (12/30 1508) Temp Source: Rectal (12/30 1508) BP: 155/58 (12/30 1449) Pulse Rate: 75 (12/30 1449)  Labs: No results for input(s): HGB, HCT, PLT, APTT, LABPROT, INR, HEPARINUNFRC, HEPRLOWMOCWT, CREATININE, CKTOTAL, CKMB, TROPONINI in the last 72 hours.  CrCl cannot be calculated (Patient's most recent lab result is older than the maximum 21 days allowed.).   Medical History: Past Medical History:  Diagnosis Date  . Anemia of chronic disease   . Chronic diastolic CHF (congestive heart failure) (HCC)   . CKD (chronic kidney disease), stage IV (HCC)   . Diabetes mellitus with renal manifestation (HCC)   . Dyslipidemia   . Edema   . Gout   . Hypertension   . Hypothyroidism   . Obesity   . Osteoarthritis   . Secondary hyperparathyroidism (HCC)   . Shortness of breath dyspnea   . Systolic murmur    a. Noted in 2011; 2D Echo 01/2010: poor acoustic quality, normal wall thickness, EF 60-65%, mild-mod TR, PASP 40mmHg.    Medications:   (Not in a hospital admission) Scheduled:   Infusions:  . propofol    . propofol (DIPRIVAN) infusion      Assessment: 82yo female with history of CKD4, dCHF, DM2, HTN, HLD, anemia and gout presents with respiratory distress. Pharmacy is  consulted to dose heparin for ACS/chest pain with positive troponin.   Goal of Therapy:  Heparin level 0.3-0.7 units/ml Monitor platelets by anticoagulation protocol: Yes   Plan:  Give 4000 units bolus x 1 Start heparin infusion at 950 units/hr Check anti-Xa level in 8 hours and daily while on heparin Continue to monitor H&H and platelets  Arlean Hoppingorey M. Newman PiesBall, PharmD, BCPS Clinical Pharmacist Pager [phone] 09/26/2016,3:13 PM

## 2016-10-07 NOTE — ED Notes (Signed)
Palpable pulses; pt remains on zoll monitor; labs drawn. OG placed. Xray called for tube verification. CBG: 318.

## 2016-10-07 NOTE — ED Notes (Signed)
PCCM NP in for eval. Diprovan gtt stopped.

## 2016-10-07 NOTE — ED Notes (Signed)
Dr. Juleen ChinaKohut intubated pt. 21 at the lip with 7.5 tube.

## 2016-10-07 NOTE — ED Triage Notes (Signed)
Ems called to home for SOB; pt originally refused transport, but convinced to come to ED for EKG changes. In route to EMS truck, pt became dyspneic, increase SOB. Pt brady in route; assisted ventilations. On arrival, pt nonverbal and minimally responsive. Pt moved to ED stretcher for eval and became hypoxic and less responsive. Preparing to intubate. MD at bedside.

## 2016-10-07 NOTE — Progress Notes (Signed)
   2015/10/23 1655  Clinical Encounter Type  Visited With Patient and family together  Visit Type ED  Referral From Other (Comment) (Paged)  Spiritual Encounters  Spiritual Needs Emotional  Stress Factors  Patient Stress Factors Not reviewed  Family Stress Factors Family relationships  Introduction to family in consult B. NP explained procedures to family. Escorted to trauma B. Family left around 1735. Pt to go to 4n. Provided emotional support to family.

## 2016-10-07 NOTE — Procedures (Signed)
Central Venous Catheter Insertion Procedure Note Donna Hurst [id number] [birth year]  Procedure: Insertion of Central Venous Catheter Indications: Assessment of intravascular volume, Drug and/or fluid administration and Frequent blood sampling  Procedure Details Consent: Unable to obtain consent because of emergent medical necessity. Time Out: Verified patient identification, verified procedure, site/side was marked, verified correct patient position, special equipment/implants available, medications/allergies/relevent history reviewed, required imaging and test results available.  Performed Real time US used to ID and cannulate vessel Maximum sterile technique was used including antiseptics, cap, gloves, gown, hand hygiene, mask and sheet. Skin prep: Chlorhexidine; local anesthetic administered A antimicrobial bonded/coated triple lumen catheter was placed in the left internal jugular vein using the Seldinger technique.  Evaluation Blood flow good Complications: No apparent complications Patient did tolerate procedure well. Chest X-ray ordered to verify placement.  CXR: pending.  Shelby Mattocksete E Andrw Mcguirt 12/28/2015, 5:08 PM  Simonne MartinetPeter E Chabely Norby ACNP-BC [facility] Pager # [phone] OR # [phone] if no answer

## 2016-10-07 NOTE — Progress Notes (Signed)
Further d/w husband Sounds like has had sig reflux sxs for some time now.  Waking up at night w/ reflux discomfort.  SOB was acute this am.  -Had no fever  -No sick exposure  -no URI sxs  Suspect dealing w/ aspiration PNA C/b element of HF.  Plan See H&P Will need aggressive PPI prob esophagram prior to dc   Simonne MartinetPeter E Hayla Hinger ACNP-BC [facility] Pager # [phone] OR # [phone] if no answer

## 2016-10-07 NOTE — H&P (Signed)
PULMONARY / CRITICAL CARE MEDICINE   Name: Donna Hurst MRN: [id number] DOB: 1932-01-24    ADMISSION DATE:  10/08/2016 CONSULTATION DATE:  12/30  REFERRING MD:  Juleen ChinakOHUT   CHIEF COMPLAINT:  Acute hypoxic respiratory failure   HISTORY OF PRESENT ILLNESS:   This is a 82 year old female w/ known h/o grade 3 diastolic dysfunction, stage II-III CKD. Admitted 12/30 w/cc: shortness of breath. Per records: pt called EMS w/ cc shortness of breath. On arrival of EMS his sats were 80%. She became unresponsive in route. Required manual BVM, regained LOC. On arrival was awake but not verbal. WOB increased. Intubated in ED. Had brief loss of pulse required ACLS for estimated 3 minutes w/ one round of epi w/ ROSC. PCCM asked to admit   PAST MEDICAL HISTORY :  She  has a past medical history of Anemia of chronic disease; Chronic diastolic CHF (congestive heart failure) (HCC); CKD (chronic kidney disease), stage IV (HCC); Diabetes mellitus with renal manifestation (HCC); Dyslipidemia; Edema; Gout; Hypertension; Hypothyroidism; Obesity; Osteoarthritis; Secondary hyperparathyroidism (HCC); Shortness of breath dyspnea; and Systolic murmur.  PAST SURGICAL HISTORY: She  has a past surgical history that includes Rotator cuff repair; Hip surgery; Back surgery; and Ankle surgery (Left).  Allergies  Allergen Reactions  . Penicillins Hives and Swelling    Has patient had a PCN reaction causing immediate rash, facial/tongue/throat swelling, SOB or lightheadedness with hypotension: Yes Has patient had a PCN reaction causing severe rash involving mucus membranes or skin necrosis: No Has patient had a PCN reaction that required hospitalization No Has patient had a PCN reaction occurring within the last 10 years: No If all of the above answers are "NO", then may proceed with Cephalosporin use.    No current facility-administered medications on file prior to encounter.    Current Outpatient Prescriptions on File Prior  to Encounter  Medication Sig  . albuterol (PROVENTIL HFA;VENTOLIN HFA) 108 (90 Base) MCG/ACT inhaler Inhale 1 puff into the lungs every 6 (six) hours as needed for wheezing or shortness of breath.  . allopurinol (ZYLOPRIM) 100 MG tablet Take 2 tablets (200 mg total) by mouth daily.  Marland Kitchen. amLODipine (NORVASC) 10 MG tablet Take 1 tablet (10 mg total) by mouth daily.  Marland Kitchen. aspirin EC 81 MG tablet Take 81 mg by mouth daily.  Marland Kitchen. atorvastatin (LIPITOR) 40 MG tablet Take 1 tablet (40 mg total) by mouth daily.  . carvedilol (COREG) 3.125 MG tablet Take 1 tablet (3.125 mg total) by mouth 2 (two) times daily with a meal.  . docusate sodium (COLACE) 100 MG capsule Take 100 mg by mouth daily as needed for mild constipation or moderate constipation.   . furosemide (LASIX) 40 MG tablet Take 2 tablets (80 mg total) by mouth 2 (two) times daily.  . insulin aspart (NOVOLOG) 100 UNIT/ML injection Inject 10 Units into the skin 2 (two) times daily before lunch and supper. If you eat more than 50% of your meal  . insulin detemir (LEVEMIR) 100 UNIT/ML injection Inject 0.2 mLs (20 Units total) into the skin 2 (two) times daily.  . isosorbide mononitrate (IMDUR) 30 MG 24 hr tablet Take 1 tablet (30 mg total) by mouth daily.  Marland Kitchen. levothyroxine (SYNTHROID, LEVOTHROID) 25 MCG tablet Take 25 mcg by mouth daily before breakfast.  . mirtazapine (REMERON) 15 MG tablet Take 1 tablet (15 mg total) by mouth at bedtime.  . Multiple Vitamin (MULTIVITAMIN) capsule Take 1 capsule by mouth daily.  . Multiple Vitamins-Minerals (PRESERVISION AREDS  2) CAPS Take 1 capsule by mouth daily.  . pantoprazole (PROTONIX) 40 MG tablet Take 1 tablet (40 mg total) by mouth daily.  . traMADol (ULTRAM) 50 MG tablet Take 50 mg by mouth at bedtime.     FAMILY HISTORY:  Her indicated that her mother is deceased. She indicated that her father is deceased. She indicated that one of her three brothers is deceased. She indicated that her maternal grandmother is  deceased. She indicated that her maternal grandfather is deceased. She indicated that her paternal grandmother is deceased. She indicated that her paternal grandfather is deceased.    SOCIAL HISTORY: She  reports that she has quit smoking. She has never used smokeless tobacco. She reports that she does not drink alcohol or use drugs.  REVIEW OF SYSTEMS:   Unable   SUBJECTIVE:  Unresponsive on vent  VITAL SIGNS: BP (!) 96/53   Pulse (!) 123   Temp 100.7 F (38.2 C) (Rectal)   Resp 19   SpO2 96%   HEMODYNAMICS:    VENTILATOR SETTINGS: Vent Mode: PRVC FiO2 (%):  [100 %] 100 % Set Rate:  [16 bmp] 16 bmp Vt Set:  [390 mL-500 mL] 390 mL PEEP:  [5 cmH20] 5 cmH20 Plateau Pressure:  [29 cmH20] 29 cmH20  INTAKE / OUTPUT: No intake/output data recorded.  PHYSICAL EXAMINATION: General:  Sedated on vent. Chronically ill appearing  Neuro:  Sedated on vent.  HEENT:  Orally intubated. No JVD Cardiovascular:  Regular rate and rhythm, wide QTc  Lungs:  Prolonged wheeze. Crackles R>L Abdomen:  Soft, + bowel sounds  Musculoskeletal:  Equal bulk and strength  Skin: cool, 3+ edema BLEs  LABS:  BMET  Recent Labs Lab 09/23/2016 1453  NA 136  K 4.4  CL 98*  CO2 24  BUN 65*  CREATININE 2.17*  GLUCOSE 313*    Electrolytes  Recent Labs Lab 10/01/2016 1453  CALCIUM 8.9    CBC  Recent Labs Lab 09/16/2016 1453  WBC 23.5*  HGB 11.3*  HCT 34.0*  PLT 331    Coag's No results for input(s): APTT, INR in the last 168 hours.  Sepsis Markers  Recent Labs Lab 09/11/2016 1506  LATICACIDVEN 6.61*    ABG No results for input(s): PHART, PCO2ART, PO2ART in the last 168 hours.  Liver Enzymes  Recent Labs Lab 09/21/2016 1453  AST 65*  ALT 20  ALKPHOS 94  BILITOT 1.0  ALBUMIN 2.8*    Cardiac Enzymes No results for input(s): TROPONINI, PROBNP in the last 168 hours.  Glucose  Recent Labs Lab 09/17/2016 1446  GLUCAP 318*    Imaging Dg Chest Portable 1  View  Result Date: 09/28/2016 CLINICAL DATA:  Respiratory failure. EXAM: PORTABLE CHEST 1 VIEW COMPARISON:  Radiographs March 21, 2016. FINDINGS: Stable cardiomediastinal silhouette. Endotracheal tube is projected over tracheal air shadow with distal tip 2 cm above the carina. Nasogastric tube is seen entering the stomach. No pneumothorax is noted. Interval development of bilateral perihilar opacities, right greater than left concerning for edema or possibly pneumonia. No significant pleural effusion is noted. Bony thorax is unremarkable. IMPRESSION: Endotracheal and nasogastric tubes in grossly good position. Bilateral perihilar opacities, right greater than left, concerning for edema or pneumonia. Electronically Signed   By: Lupita Raider, M.D.   On: 09/26/2016 15:26     STUDIES:    CULTURES: Sputum 12/30>>> BCX2 12/30>>> resp viral scan 12/30>>> uc 12/30>>>  ANTIBIOTICS: levaquin 12/30>>> azactam 12/30>>>   SIGNIFICANT EVENTS:   LINES/TUBES:  DISCUSSION:  ASSESSMENT / PLAN:  PULMONARY A: Acute Hypoxic Respiratory Failure  Right > left airspace disease: ddx: CAP vs asymmetric pulmonary edema  Mild hemoptysis  P:   Full vent support  F/u abg PAD protocol   CARDIOVASCULAR A:  S/p brief PEA arrest LBBB  Troponin elevation  Acute on Chronic diastolic HF (grade 3) H/o HTN Mild AS Shock (sepsis vs cardiogenic) P:  Tele  Cycle CEs Cont heparin gtt for now CVP  Levophed for MAP >65 Further recs per cards  RENAL A:   CKD stage III-IV w/ baseline scr ~2.3  Lactic acidosis  P:   Repeat LA s/p fluid challenge  Renal dose meds Strict I&O  GASTROINTESTINAL A:   Obesity  P:   NPO H2B for SUP Start tube feeds 12/31 if not extubated   HEMATOLOGIC A:   Anemia of chronic disease  P:  Serial CBC  Transfuse per ICU protocol    INFECTIOUS A:   R/o CAP R/o sepsis  P:   PCT algo Sputum culture  Trend CBC and fever curve abx as above    ENDOCRINE A:   Hyperglycemia  Type II DM Hypothyroidism    P:   ssi  Replace synthroid  NEUROLOGIC A:   H/o CA stenosis  P:   RASS goal: -2 PAD protocol  Supportive care   FAMILY  - Updates:   - Inter-disciplinary family meet or Palliative Care meeting due by:     45 minutes ccm time   Simonne MartinetPeter E Babcock ACNP-BC [facility]/Critical Care Pager # [phone] OR # [phone] if no answer 09/17/2016, 4:02 PM  Attending Note:  I have examined patient, reviewed labs, studies and notes. I have discussed the case with Kreg ShropshireP Babcock, and I agree with the data and plans as amended above. 82 yo woman with HTN, dCHF, CKD3 admitted 12/30 with acute dyspnea that progressed to resp arrest and brief cardiac arrest (3 minutes). No reported prodrome. She was intubated, ventilated. CXR shows R >> L infiltrates. Mild fever noted in the ED. She is unresponsive (on propofol), has coarse crackles bilaterally, is hypotensive and is being started on norepi now. Etiology of her decompensation unclear - she has frothy sputum consistent w pulm edema, but also has assymetric infiltrates suggestive of PNA. we will treat with empiric abx to cover possible severe CAP. She is not in position to tolerate diuresis right now - will reconsider if she hemodynamically improves. Will place CVC now, follow CVP and volume resuscitate. Trend lactate for clearance. Evolving shock - septic vs cardiogenic. Independent critical care time is 45 minutes.   Levy Pupaobert Jayko Voorhees, MD, PhD 09/21/2016, 4:58 PM [place] [phone] or if no answer [phone]

## 2016-10-07 NOTE — ED Provider Notes (Signed)
[facility]-EMERGENCY DEPT Provider Note   CSN: [id number] Arrival date & time: 18-Apr-2016  1445  By signing my name below, I, Sonum Patel, attest that this documentation has been prepared under the direction and in the presence of Raeford RazorStephen Arya Luttrull, MD. Electronically Signed: Sonum Patel, Neurosurgeoncribe. 18-Apr-2016. 2:52 PM.  History   Chief Complaint Chief Complaint  Patient presents with  . Respiratory Distress    The history is provided by the EMS personnel. The history is limited by the condition of the patient. No language interpreter was used.     LEVEL 5 CAVEAT: Respiratory Distress HPI Comments: Donna Hurst is a 82 y.o. female brought in by ambulance, who presents to the Emergency Department In respiratory distress. EMS reports that she has had increasing shortness of breath the past 3 days. Initially she was alert enough to speak with them and actually refused transport but they EMS was able to convince her to come in to be evaluated. As they were walking her to the stretcher she became increasingly dyspneic and hypoxic into the 80s% and they placed her on a NRB. Shortly after this she became bradycardic into the 30s and unresponsive. She did not lose pulses. They tried to intubate her without medications. During this attempt, her heart rate improved and she began moving spontaneously. She received albuterol and magnesium sulfate prehospital.   On arrival to the ED she was in respiratory distress to the point of being unable to speak. She did move all extremities on command though. While moving her EMS stretcher onto ED bed she again became hypoxic into 70s despite NRB. She was intubated. During intubation she became bradycardic and lost pulses. CPR started. Epinephrine x1. CPR for ~3 minutes. No glucose prehospital and empirically given 1 amp d50.     Past Medical History:  Diagnosis Date  . Anemia of chronic disease   . Chronic diastolic CHF (congestive heart failure) (HCC)   . CKD (chronic  kidney disease), stage IV (HCC)   . Diabetes mellitus with renal manifestation (HCC)   . Dyslipidemia   . Edema   . Gout   . Hypertension   . Hypothyroidism   . Obesity   . Osteoarthritis   . Secondary hyperparathyroidism (HCC)   . Shortness of breath dyspnea   . Systolic murmur    a. Noted in 2011; 2D Echo 01/2010: poor acoustic quality, normal wall thickness, EF 60-65%, mild-mod TR, PASP 40mmHg.    Patient Active Problem List   Diagnosis Date Noted  . Acute on chronic diastolic congestive heart failure (HCC)   . CAP (community acquired pneumonia)   . Bacteremia due to Streptococcus 03/21/2016  . HCAP (healthcare-associated pneumonia) 03/21/2016  . CKD (chronic kidney disease), stage III 03/21/2016  . Diabetes mellitus type 2, uncontrolled (HCC) 03/21/2016  . Heart failure (HCC) 03/19/2016  . CHF exacerbation (HCC) 03/19/2016  . Chronic renal failure 02/03/2015  . Aortic stenosis 02/03/2015  . Acute on chronic diastolic heart failure (HCC) 12/17/2014  . Elevated troponin 12/16/2014  . Acute renal disease 12/15/2014  . Dyspnea 12/15/2014    Past Surgical History:  Procedure Laterality Date  . ANKLE SURGERY Left    " many years ago "  . BACK SURGERY    . HIP SURGERY     For OA  . ROTATOR CUFF REPAIR      OB History    No data available       Home Medications    Prior to Admission medications  Medication Sig Start Date End Date Taking? Authorizing Provider  albuterol (PROVENTIL HFA;VENTOLIN HFA) 108 (90 Base) MCG/ACT inhaler Inhale 1 puff into the lungs every 6 (six) hours as needed for wheezing or shortness of breath.    Historical Provider, MD  allopurinol (ZYLOPRIM) 100 MG tablet Take 2 tablets (200 mg total) by mouth daily. 03/23/16   Ripudeep Jenna Luo, MD  amLODipine (NORVASC) 10 MG tablet Take 1 tablet (10 mg total) by mouth daily. 09/18/16   Wendall Stade, MD  aspirin EC 81 MG tablet Take 81 mg by mouth daily.    Historical Provider, MD  atorvastatin  (LIPITOR) 40 MG tablet Take 1 tablet (40 mg total) by mouth daily. 09/18/16 12/17/16  Wendall Stade, MD  carvedilol (COREG) 3.125 MG tablet Take 1 tablet (3.125 mg total) by mouth 2 (two) times daily with a meal. 03/23/16   Ripudeep Jenna Luo, MD  docusate sodium (COLACE) 100 MG capsule Take 100 mg by mouth daily as needed for mild constipation or moderate constipation.     Historical Provider, MD  furosemide (LASIX) 40 MG tablet Take 2 tablets (80 mg total) by mouth 2 (two) times daily. 12/18/14   Gaspar Garbe, MD  insulin aspart (NOVOLOG) 100 UNIT/ML injection Inject 10 Units into the skin 2 (two) times daily before lunch and supper. If you eat more than 50% of your meal 03/23/16   Ripudeep K Rai, MD  insulin detemir (LEVEMIR) 100 UNIT/ML injection Inject 0.2 mLs (20 Units total) into the skin 2 (two) times daily. 03/23/16   Ripudeep Jenna Luo, MD  isosorbide mononitrate (IMDUR) 30 MG 24 hr tablet Take 1 tablet (30 mg total) by mouth daily. 04/06/16   Janetta Hora, PA-C  levothyroxine (SYNTHROID, LEVOTHROID) 25 MCG tablet Take 25 mcg by mouth daily before breakfast.    Historical Provider, MD  mirtazapine (REMERON) 15 MG tablet Take 1 tablet (15 mg total) by mouth at bedtime. 12/18/14   Gaspar Garbe, MD  Multiple Vitamin (MULTIVITAMIN) capsule Take 1 capsule by mouth daily.    Historical Provider, MD  Multiple Vitamins-Minerals (PRESERVISION AREDS 2) CAPS Take 1 capsule by mouth daily.    Historical Provider, MD  pantoprazole (PROTONIX) 40 MG tablet Take 1 tablet (40 mg total) by mouth daily. 03/23/16   Ripudeep Jenna Luo, MD  traMADol (ULTRAM) 50 MG tablet Take 50 mg by mouth at bedtime.     Historical Provider, MD    Family History Family History  Problem Relation Age of Onset  . CAD Father     Died of MI at 43  . Diabetes Mother   . Hypertension Mother   . CAD Brother     s/p CABG  . CAD Brother     Died at 3 of MI    Social History Social History  Substance Use Topics  . Smoking  status: Former Games developer  . Smokeless tobacco: Never Used     Comment: Smoked for <10 years  . Alcohol use No     Allergies   Penicillins   Review of Systems Review of Systems  Unable to perform ROS: Severe respiratory distress     Physical Exam Updated Vital Signs There were no vitals taken for this visit.  Physical Exam  Constitutional: She appears distressed.  In obvious distress. Pale appearance. Cool, mottled extremities.  HENT:  Head: Normocephalic and atraumatic.  Eyes: Conjunctivae are normal. Pupils are equal, round, and reactive to light. Right eye exhibits no discharge.  Left eye exhibits no discharge.  Neck: Neck supple.  Cardiovascular: Regular rhythm and normal heart sounds.  Exam reveals no gallop and no friction rub.   No murmur heard. Pulmonary/Chest: She is in respiratory distress.  Respiratory distress. Tachypnea. Unable to speak secondary to respiratory effort. Coarse breath sounds bilaterally.  Abdominal: Soft. She exhibits no distension. There is no tenderness.  Musculoskeletal: She exhibits edema.  Symmetric, pitting lower extremity edema. There is some mild scattered ecchymosis to her abdomen and left thigh.  Neurological:  Lethargic. Would attempt to open eyes to voice. Would move all 4 extremities on command.  Skin: There is pallor.  Nursing note and vitals reviewed.    ED Treatments / Results  Labs (all labs ordered are listed, but only abnormal results are displayed) Labs Reviewed  COMPREHENSIVE METABOLIC PANEL - Abnormal; Notable for the following:       Result Value   Chloride 98 (*)    Glucose, Bld 313 (*)    BUN 65 (*)    Creatinine, Ser 2.17 (*)    Albumin 2.8 (*)    AST 65 (*)    GFR calc non Af Amer 20 (*)    GFR calc Af Amer 23 (*)    All other components within normal limits  CBC WITH DIFFERENTIAL/PLATELET - Abnormal; Notable for the following:    WBC 23.5 (*)    RBC 3.48 (*)    Hemoglobin 11.3 (*)    HCT 34.0 (*)     Neutro Abs 16.7 (*)    Lymphs Abs 4.3 (*)    Monocytes Absolute 2.4 (*)    All other components within normal limits  CBG MONITORING, ED - Abnormal; Notable for the following:    Glucose-Capillary 318 (*)    All other components within normal limits  I-STAT TROPOININ, ED - Abnormal; Notable for the following:    Troponin i, poc 3.64 (*)    All other components within normal limits  I-STAT CG4 LACTIC ACID, ED - Abnormal; Notable for the following:    Lactic Acid, Venous 6.61 (*)    All other components within normal limits  CULTURE, BLOOD (ROUTINE X 2)  CULTURE, BLOOD (ROUTINE X 2)  URINALYSIS, ROUTINE W REFLEX MICROSCOPIC  PROTIME-INR  HEPARIN LEVEL (UNFRACTIONATED)  I-STAT TROPOININ, ED  I-STAT CG4 LACTIC ACID, ED  I-STAT ARTERIAL BLOOD GAS, ED    EKG  EKG Interpretation  Date/Time:  Saturday October 07 2016 14:58:20 EST Ventricular Rate:  58 PR Interval:    QRS Duration: 150 QT Interval:  451 QTC Calculation: 443 R Axis:   91 Text Interpretation:  Sinus rhythm Prolonged PR interval Right bundle branch block ST depression, consider ischemia, diffuse lds Confirmed by Juleen ChinaKOHUT  MD, Lundynn Cohoon [phone]([id number]) on 09/29/2016 3:10:57 PM       Radiology Dg Chest Portable 1 View  Result Date: 09/14/2016 CLINICAL DATA:  Respiratory failure. EXAM: PORTABLE CHEST 1 VIEW COMPARISON:  Radiographs March 21, 2016. FINDINGS: Stable cardiomediastinal silhouette. Endotracheal tube is projected over tracheal air shadow with distal tip 2 cm above the carina. Nasogastric tube is seen entering the stomach. No pneumothorax is noted. Interval development of bilateral perihilar opacities, right greater than left concerning for edema or possibly pneumonia. No significant pleural effusion is noted. Bony thorax is unremarkable. IMPRESSION: Endotracheal and nasogastric tubes in grossly good position. Bilateral perihilar opacities, right greater than left, concerning for edema or pneumonia. Electronically Signed    By: Lupita RaiderJames  Green Jr, M.D.   On: 09/27/2016  15:26    Procedures Procedures (including critical care time)  INTUBATION Performed by: Raeford Razor  Required items: required blood products, implants, devices, and special equipment available Patient identity confirmed: provided demographic data and hospital-assigned identification number Time out: Immediately prior to procedure a "time out" was called to verify the correct patient, procedure, equipment, support staff and site/side marked as required.  Indications: Respiratory failure, very protection Intubation method: Glidescope Laryngoscopy   Preoxygenation: BVM  Sedatives: Etomidate  Paralytic: Rocuronium   Tube Size: 7.5 cuffed  Post-procedure assessment: chest rise and ETCO2 monitor Breath sounds: equal and absent over the epigastrium Tube secured with: ETT holder Chest x-ray interpreted by radiologist and me.  Chest x-ray findings:endotracheal tube in appropriate position  Patient tolerated the procedure well with no immediate complications.   Cardiopulmonary Resuscitation (CPR) Procedure Note Directed/Performed by: Raeford Razor I personally directed ancillary staff and/or performed CPR in an effort to regain return of spontaneous circulation and to maintain cardiac, neuro and systemic perfusion.    CRITICAL CARE Performed by: Raeford Razor Total critical care time: 45 minutes Critical care time was exclusive of separately billable procedures and treating other patients. Critical care was necessary to treat or prevent imminent or life-threatening deterioration. Critical care was time spent personally by me on the following activities: development of treatment plan with patient and/or surrogate as well as nursing, discussions with consultants, evaluation of patient's response to treatment, examination of patient, obtaining history from patient or surrogate, ordering and performing treatments and interventions, ordering  and review of laboratory studies, ordering and review of radiographic studies, pulse oximetry and re-evaluation of patient's condition.      Medications Ordered in ED Medications  propofol (DIPRIVAN) 1000 MG/100ML infusion ( Intravenous New Bag/Given 09/11/2016 1519)  levofloxacin (LEVAQUIN) IVPB 750 mg (not administered)  aztreonam (AZACTAM) 2 g in dextrose 5 % 50 mL IVPB (2 g Intravenous New Bag/Given 09/15/2016 1540)  vancomycin (VANCOCIN) IVPB 1000 mg/200 mL premix (not administered)  heparin ADULT infusion 100 units/mL (25000 units/245mL sodium chloride 0.45%) (950 Units/hr Intravenous New Bag/Given 10/01/2016 1549)  heparin 5000 UNIT/ML injection (not administered)  aspirin suppository 300 mg (not administered)  etomidate (AMIDATE) injection (20 mg Intravenous Given 09/23/2016 1440)  rocuronium (ZEMURON) injection (60 mg Intravenous Given 09/14/2016 1441)  EPINEPHrine (ADRENALIN) 1 MG/10ML injection (1 mg Intravenous Given 09/29/2016 1443)  sodium bicarbonate injection (50 mEq Intravenous Given 09/25/2016 1445)  sodium chloride 0.9 % bolus 30 mL/kg (1,000 mLs Intravenous New Bag/Given 10/05/2016 1520)  heparin bolus via infusion 4,000 Units (4,000 Units Intravenous Bolus from Bag 10/02/2016 1549)     Initial Impression / Assessment and Plan / ED Course  I have reviewed the triage vital signs and the nursing notes.  Pertinent labs & imaging results that were available during my care of the patient were reviewed by me and considered in my medical decision making (see chart for details).  Clinical Course     82 year old female with respiratory arrest. She was intubated because of respiratory failure airway protection. She briefly lost pulses during intubation. Chest x-ray with bilateral opacities which are asymmetric with right greater than the left. She is febrile. Concern for pneumonia, possibly on top of underlying HF.  Empiric antibiotics. Lactic acid significantly elevated. Fluid  boluses.  EKG with intermittent block. Rate related? Appears to have diffuse underlying ischemic changes. Known history of diastolic heart failure.  Unsure she has a history CAD. Troponin is significantly elevated. Could very well be demand ischemia. Heparin was ordered  though. Consultations to CCM and cardiology.   Final Clinical Impressions(s) / ED Diagnoses   Final diagnoses:  Acute respiratory failure with hypoxia (HCC)  Community acquired pneumonia, unspecified laterality  Severe sepsis (HCC)  Elevated troponin    New Prescriptions New Prescriptions   No medications on file   I personally preformed the services scribed in my presence. The recorded information has been reviewed is accurate. Raeford Razor, MD.    Raeford Razor, MD 10/12/16 [phone]

## 2016-10-07 NOTE — ED Notes (Signed)
Levophed at 3720mcq/min

## 2016-10-08 ENCOUNTER — Inpatient Hospital Stay (HOSPITAL_COMMUNITY): Payer: PPO

## 2016-10-08 DIAGNOSIS — R6521 Severe sepsis with septic shock: Secondary | ICD-10-CM

## 2016-10-08 DIAGNOSIS — I214 Non-ST elevation (NSTEMI) myocardial infarction: Secondary | ICD-10-CM

## 2016-10-08 DIAGNOSIS — A419 Sepsis, unspecified organism: Principal | ICD-10-CM

## 2016-10-08 DIAGNOSIS — J690 Pneumonitis due to inhalation of food and vomit: Secondary | ICD-10-CM

## 2016-10-08 LAB — PHOSPHORUS
PHOSPHORUS: 3.6 mg/dL (ref 2.5–4.6)
PHOSPHORUS: 3.3 mg/dL (ref 2.5–4.6)
Phosphorus: 3.3 mg/dL (ref 2.5–4.6)

## 2016-10-08 LAB — BLOOD GAS, ARTERIAL
Acid-base deficit: 2.2 mmol/L — ABNORMAL HIGH (ref 0.0–2.0)
Bicarbonate: 21.8 mmol/L (ref 20.0–28.0)
DRAWN BY: 345601
FIO2: 50
MECHVT: 390 mL
O2 SAT: 92.2 %
PEEP/CPAP: 10 cmH2O
PO2 ART: 59.8 mmHg — AB (ref 83.0–108.0)
Patient temperature: 97
RATE: 16 resp/min
pCO2 arterial: 34 mmHg (ref 32.0–48.0)
pH, Arterial: 7.418 (ref 7.350–7.450)

## 2016-10-08 LAB — CBC
HCT: 28.3 % — ABNORMAL LOW (ref 36.0–46.0)
Hemoglobin: 9.3 g/dL — ABNORMAL LOW (ref 12.0–15.0)
MCH: 31.4 pg (ref 26.0–34.0)
MCHC: 32.9 g/dL (ref 30.0–36.0)
MCV: 95.6 fL (ref 78.0–100.0)
PLATELETS: 286 10*3/uL (ref 150–400)
RBC: 2.96 MIL/uL — ABNORMAL LOW (ref 3.87–5.11)
RDW: 15.4 % (ref 11.5–15.5)
WBC: 24.2 10*3/uL — ABNORMAL HIGH (ref 4.0–10.5)

## 2016-10-08 LAB — MAGNESIUM
MAGNESIUM: 2.2 mg/dL (ref 1.7–2.4)
Magnesium: 2.1 mg/dL (ref 1.7–2.4)
Magnesium: 2.1 mg/dL (ref 1.7–2.4)

## 2016-10-08 LAB — BASIC METABOLIC PANEL
Anion gap: 11 (ref 5–15)
BUN: 62 mg/dL — AB (ref 6–20)
CALCIUM: 7.8 mg/dL — AB (ref 8.9–10.3)
CO2: 24 mmol/L (ref 22–32)
Chloride: 100 mmol/L — ABNORMAL LOW (ref 101–111)
Creatinine, Ser: 2.08 mg/dL — ABNORMAL HIGH (ref 0.44–1.00)
GFR calc Af Amer: 24 mL/min — ABNORMAL LOW (ref >60)
GFR, EST NON AFRICAN AMERICAN: 21 mL/min — AB (ref >60)
GLUCOSE: 277 mg/dL — AB (ref 65–99)
POTASSIUM: 3.6 mmol/L (ref 3.5–5.1)
Sodium: 135 mmol/L (ref 135–145)

## 2016-10-08 LAB — GLUCOSE, CAPILLARY
GLUCOSE-CAPILLARY: 283 mg/dL — AB (ref 65–99)
GLUCOSE-CAPILLARY: 224 mg/dL — AB (ref 65–99)
GLUCOSE-CAPILLARY: 186 mg/dL — AB (ref 65–99)
GLUCOSE-CAPILLARY: 180 mg/dL — AB (ref 65–99)
Glucose-Capillary: 312 mg/dL — ABNORMAL HIGH (ref 65–99)
Glucose-Capillary: 249 mg/dL — ABNORMAL HIGH (ref 65–99)

## 2016-10-08 LAB — TROPONIN I
TROPONIN I: 26.52 ng/mL — AB (ref <0.03)
TROPONIN I: 45.7 ng/mL — AB (ref <0.03)

## 2016-10-08 LAB — HEMOGLOBIN A1C
Hgb A1c MFr Bld: 6.1 % — ABNORMAL HIGH (ref 4.8–5.6)
MEAN PLASMA GLUCOSE: 128 mg/dL

## 2016-10-08 LAB — HEPARIN LEVEL (UNFRACTIONATED)
HEPARIN UNFRACTIONATED: 0.76 [IU]/mL — AB (ref 0.30–0.70)
HEPARIN UNFRACTIONATED: 0.74 [IU]/mL — AB (ref 0.30–0.70)
Heparin Unfractionated: 0.42 IU/mL (ref 0.30–0.70)
Heparin Unfractionated: 0.82 IU/mL — ABNORMAL HIGH (ref 0.30–0.70)

## 2016-10-08 LAB — PROCALCITONIN: PROCALCITONIN: 29.79 ng/mL

## 2016-10-08 MED ORDER — LACTATED RINGERS IV SOLN
INTRAVENOUS | Status: DC
  Administered 2016-10-08: 14:00:00 via INTRAVENOUS

## 2016-10-08 MED ORDER — ATORVASTATIN CALCIUM 40 MG PO TABS: 40.0000 mg | ORAL_TABLET | Freq: Every day | ORAL | Status: DC

## 2016-10-08 MED ORDER — ALBUTEROL SULFATE (2.5 MG/3ML) 0.083% IN NEBU
2.5000 mg | INHALATION_SOLUTION | RESPIRATORY_TRACT | Status: DC | PRN
Start: 2016-10-08 — End: 2016-10-10

## 2016-10-08 MED ORDER — VITAL HIGH PROTEIN PO LIQD
1000.0000 mL | ORAL | Status: DC
  Filled 2016-10-08 (×2): qty 1000

## 2016-10-08 MED ORDER — ADULT MULTIVITAMIN LIQUID CH
15.0000 mL | Freq: Every day | ORAL | Status: DC
  Administered 2016-10-08 – 2016-10-09 (×2): 15 mL
  Filled 2016-10-08 (×3): qty 15

## 2016-10-08 MED ORDER — VITAL HIGH PROTEIN PO LIQD
1000.0000 mL | ORAL | Status: DC
  Administered 2016-10-08 – 2016-10-10 (×3): 1000 mL
  Filled 2016-10-08 (×4): qty 1000

## 2016-10-08 MED ORDER — PANTOPRAZOLE SODIUM 40 MG PO PACK
40.0000 mg | PACK | ORAL | Status: DC
  Administered 2016-10-09: 40 mg
  Filled 2016-10-08: qty 20

## 2016-10-08 MED ORDER — ACETAMINOPHEN 325 MG PO TABS
650.0000 mg | ORAL_TABLET | Freq: Once | ORAL | Status: AC
  Administered 2016-10-08: 650 mg via ORAL
  Filled 2016-10-08: qty 2

## 2016-10-08 MED ORDER — PRO-STAT SUGAR FREE PO LIQD
30.0000 mL | Freq: Two times a day (BID) | ORAL | Status: DC
  Administered 2016-10-08: 30 mL
  Filled 2016-10-08: qty 30

## 2016-10-08 MED ORDER — ASPIRIN 81 MG PO CHEW
81.0000 mg | CHEWABLE_TABLET | Freq: Every day | ORAL | Status: DC
  Administered 2016-10-08 – 2016-10-10 (×3): 81 mg
  Filled 2016-10-08 (×3): qty 1

## 2016-10-08 MED ORDER — ATORVASTATIN CALCIUM 40 MG PO TABS
40.0000 mg | ORAL_TABLET | Freq: Every day | ORAL | Status: DC
  Administered 2016-10-08 – 2016-10-10 (×3): 40 mg
  Filled 2016-10-08 (×3): qty 1

## 2016-10-08 MED FILL — Medication: Qty: 1 | Status: AC

## 2016-10-08 NOTE — Progress Notes (Signed)
ANTICOAGULATION CONSULT NOTE - Follow Up Consult  Pharmacy Consult for heparin Indication: chest pain/ACS   Patient Measurements:   Heparin Dosing Weight: 69 kg  Labs:  Recent Labs  10/08/2016 1453 09/22/2016 1640 09/11/2016 2243  10/08/16 0443 10/08/16 0500 10/08/16 0845 10/08/16 1830  HGB 11.3*  --   --   --  9.3*  --   --   --   HCT 34.0*  --   --   --  28.3*  --   --   --   PLT 331  --   --   --  286  --   --   --   LABPROT 15.1  --   --   --   --   --   --   --   INR 1.18  --   --   --   --   --   --   --   HEPARINUNFRC  --   --   --   < >  --  0.76* 0.74* 0.42  CREATININE 2.17*  --   --   --  2.08*  --   --   --   TROPONINI  --  5.65* 26.52*  --  45.70*  --   --   --   < > = values in this interval not displayed.  Assessment: 82yo female with respiratory distress. Pharmacy consulted to dose heparin for ACS/chest pain. Heparin level is now at goal on 700 units/hr.   Goal of Therapy:  Heparin level 0.3-0.7 units/ml   Plan:  HL= 0.42 No heparin changes needed Daily heparin level and CBC   Harland Germanndrew Austynn Pridmore, Pharm D 10/08/2016 7:35 PM

## 2016-10-08 NOTE — Progress Notes (Signed)
ANTICOAGULATION CONSULT NOTE - Follow Up Consult  Pharmacy Consult for heparin Indication: chest pain/ACS   Patient Measurements:   Heparin Dosing Weight: 69 kg  Labs:  Recent Labs  11-02-2015 1453 11-02-2015 1640 11-02-2015 2243 10/08/16 0000 10/08/16 0443 10/08/16 0500 10/08/16 0845  HGB 11.3*  --   --   --  9.3*  --   --   HCT 34.0*  --   --   --  28.3*  --   --   PLT 331  --   --   --  286  --   --   LABPROT 15.1  --   --   --   --   --   --   INR 1.18  --   --   --   --   --   --   HEPARINUNFRC  --   --   --  0.82*  --  0.76* 0.74*  CREATININE 2.17*  --   --   --  2.08*  --   --   TROPONINI  --  5.65* 26.52*  --  45.70*  --   --     Assessment: 82yo female with respiratory distress. Pharmacy consulted to dose heparin for ACS/chest pain. Heparin level remains supra-therapeutic on reduced dose. CBC stable. No issues noted.   Goal of Therapy:  Heparin level 0.3-0.7 units/ml   Plan:  Decrease heparin to 700 units/hr 8hr heparin level Daily heparin level and CBC Monitor for S&S of bleed  Sandi CarneNick Michaelia Beilfuss, PharmD, BCPS Pharmacy Resident Pager: [phone] 10/08/2016,9:50 AM

## 2016-10-08 NOTE — Progress Notes (Signed)
Initial Nutrition Assessment  DOCUMENTATION CODES:   Obesity unspecified  INTERVENTION:  Initiate Vital High Protein at 50 ml/hr via OGT. Goal regimen provides 1200 kcal, 105 grams of protein, 1008 ml H2O daily.  Provide liquid multivitamin with minerals daily per tube as goal TF rate does not meet 100% RDIs.  NUTRITION DIAGNOSIS:   Inadequate oral intake related to inability to eat as evidenced by NPO status.  GOAL:   Provide needs based on ASPEN/SCCM guidelines  MONITOR:   Vent status, Labs, Weight trends, I & O's, TF tolerance  REASON FOR ASSESSMENT:   Consult Enteral/tube feeding initiation and management  ASSESSMENT:   82 yo female with PMHx intermittent LBBB, chronic diastolic CHF, CKD 3, HTN, Hypothyroidism, Gout, HLD, DM  presented with dyspnea, hypoxia, altered mental status, found to have acute hypoxic respiratory failure secondary to PNA. Intubated in ER with brief CPR (3 minutes for ROSC).   Current body weight likely falsely elevated in setting of edema. Per chart patient was 197 lbs (89.4 kg) on 09/18/2016. Adult TF protocol initiated prior to assessment.  Patient is currently intubated on ventilator support MV: 11.5 L/min Temp (24hrs), Avg:99 F (37.2 C), Min:96.4 F (35.8 C), Max:100.8 F (38.2 C)  Propofol: 8 ml/hr (211 kcal/day)  Access: OG tube with tip verified in stomach on Abd xray 12/30  Medications reviewed and include: Novolog sliding scale Q4hrs, levothyroxine, pantoprazole, vancomycin, heparin gtt, LR @ 40 ml/hr, Levophed gtt, propofol gtt.  Labs reviewed: CBG 186-339, Chloride 100, BUN 62, Creatinine 2.08, elevated Troponin.  Nutrition-Focused physical exam completed. Findings are no fat depletion, no muscle depletion, and severe edema in legs (some mild edema in arms).  Discussed with RN.  Diet Order:  Diet NPO time specified  Skin:  Reviewed, no issues  Last BM:  Unknown  Height:   Ht Readings from Last 1 Encounters:   10-03-2016 5\' 2"  (1.575 m)    Weight:   Wt Readings from Last 1 Encounters:  10/08/16 217 lb 6 oz (98.6 kg)    Ideal Body Weight:  50 kg  BMI:  Body mass index is 39.76 kg/m.  Estimated Nutritional Needs:   Kcal:  [id number]  Protein:  >/= 100 grams (2 grams/kg IBW)  Fluid:  1.5 L/day  EDUCATION NEEDS:   No education needs identified at this time  Helane RimaLeanne Anjelita Sheahan, MS, RD, LDN Pager: [phone] After Hours Pager: [phone]

## 2016-10-08 NOTE — Consult Note (Signed)
Admit date: 09/08/2016 Referring Physician  Dr. Craige Cotta Primary Physician Gaspar Garbe, MD Primary Cardiologist  Dr. Eden Emms Reason for Consultation  non-ST elevation MI following acute respiratory failure and brief cardiac arrest  HPI: 82 year old female with history of mild aortic stenosis, carotid artery stenosis and chronic diastolic heart failure who was admitted with acute respiratory failure, hypoxia, altered mental status intubated in the emergency room with brief CPR lasting approximate 3 minutes till return of spontaneous circulation.  This was on 09/14/2016 and currently she is quite anxious with her endotracheal tube in place but able to follow commands. She is trying to vocalize but obviously this is challenging.  Her creatinine remains 2.08 (baseline in June was 2.01). Her troponin has significantly elevated up to 45. Pro calcitonin is highly elevated at 29. She is currently on IV heparin. She is being treated for septic shock. Recent temperature 100.1.  Blood cultures to this point have been no growth.  Ultimately possible aspiration pneumonia event. Left bundle branch block is also been noted intermittently.  She was previously hospitalized in June 2017 with healthcare associated pneumonia as well as acute on chronic diastolic heart failure in the setting of bacteremia and sepsis from unknown cause. Ejection fraction at that time was 65% and her aortic stenosis showed a mean gradient of 16 mmHg. Her discharge weight at that time was 202 pounds. Moderate secondary pulmonary hypertension was noted.   PMH:   Past Medical History:  Diagnosis Date  . Anemia of chronic disease   . Chronic diastolic CHF (congestive heart failure) (HCC)   . CKD (chronic kidney disease), stage IV (HCC)   . Diabetes mellitus with renal manifestation (HCC)   . Dyslipidemia   . Edema   . Gout   . Hypertension   . Hypothyroidism   . Obesity   . Osteoarthritis   . Secondary hyperparathyroidism  (HCC)   . Shortness of breath dyspnea   . Systolic murmur    a. Noted in 2011; 2D Echo 01/2010: poor acoustic quality, normal wall thickness, EF 60-65%, mild-mod TR, PASP .    PSH:   Past Surgical History:  Procedure Laterality Date  . ANKLE SURGERY Left    " many years ago "  . BACK SURGERY    . HIP SURGERY     For OA  . ROTATOR CUFF REPAIR     Allergies:  Penicillins Prior to Admit Meds:   Prior to Admission medications   Medication Sig Start Date End Date Taking? Authorizing Provider  albuterol (PROVENTIL HFA;VENTOLIN HFA) 108 (90 Base) MCG/ACT inhaler Inhale 1 puff into the lungs every 6 (six) hours as needed for wheezing or shortness of breath.   Yes Historical Provider, MD  allopurinol (ZYLOPRIM) 100 MG tablet Take 2 tablets (200 mg total) by mouth daily. Patient taking differently: Take 300 mg by mouth daily.  03/23/16  Yes Ripudeep Jenna Luo, MD  amLODipine (NORVASC) 10 MG tablet Take 1 tablet (10 mg total) by mouth daily. 09/18/16  Yes Wendall Stade, MD  aspirin EC 81 MG tablet Take 81 mg by mouth daily.   Yes Historical Provider, MD  atorvastatin (LIPITOR) 40 MG tablet Take 1 tablet (40 mg total) by mouth daily. 09/18/16 12/17/16 Yes Wendall Stade, MD  carvedilol (COREG) 3.125 MG tablet Take 1 tablet (3.125 mg total) by mouth 2 (two) times daily with a meal. 03/23/16  Yes Ripudeep K Rai, MD  docusate sodium (COLACE) 100 MG capsule Take 100 mg  by mouth daily as needed for mild constipation or moderate constipation.    Yes Historical Provider, MD  furosemide (LASIX) 40 MG tablet Take 2 tablets (80 mg total) by mouth 2 (two) times daily. 12/18/14  Yes Gaspar Garbe, MD  insulin aspart (NOVOLOG) 100 UNIT/ML injection Inject 10 Units into the skin 2 (two) times daily before lunch and supper. If you eat more than 50% of your meal Patient taking differently: Inject 10 Units into the skin 2 (two) times daily before lunch and supper. Per sliding scale-If you eat more than 50% of your  meal 03/23/16  Yes Ripudeep K Rai, MD  insulin detemir (LEVEMIR) 100 UNIT/ML injection Inject 0.2 mLs (20 Units total) into the skin 2 (two) times daily. Patient taking differently: Inject 32 Units into the skin 2 (two) times daily.  03/23/16  Yes Ripudeep Jenna Luo, MD  levothyroxine (SYNTHROID, LEVOTHROID) 25 MCG tablet Take 25 mcg by mouth daily before breakfast.   Yes Historical Provider, MD  mirtazapine (REMERON) 15 MG tablet Take 1 tablet (15 mg total) by mouth at bedtime. 12/18/14  Yes Gaspar Garbe, MD  Multiple Vitamin (MULTIVITAMIN) capsule Take 1 capsule by mouth daily.   Yes Historical Provider, MD  Multiple Vitamins-Minerals (PRESERVISION AREDS 2) CAPS Take 1 capsule by mouth daily.   Yes Historical Provider, MD  traMADol (ULTRAM) 50 MG tablet Take 50 mg by mouth 2 (two) times daily as needed for moderate pain.    Yes Historical Provider, MD   Fam HX:    Family History  Problem Relation Age of Onset  . CAD Father     Died of MI at 25  . Diabetes Mother   . Hypertension Mother   . CAD Brother     s/p CABG  . CAD Brother     Died at 27 of MI   Social HX:    Social History   Social History  . Marital status: Married    Spouse name: N/A  . Number of children: N/A  . Years of education: N/A   Occupational History  . Retired Engineer, civil (consulting)    Social History Main Topics  . Smoking status: Former Games developer  . Smokeless tobacco: Never Used     Comment: Smoked for <10 years  . Alcohol use No  . Drug use: No  . Sexual activity: Not on file   Other Topics Concern  . Not on file   Social History Narrative  . No narrative on file     ROS:  All 11 ROS were addressed and are negative except what is stated in the HPI   Physical Exam: Blood pressure (!) 120/44, pulse 87, temperature (!) 100.8 F (38.2 C), temperature source Oral, resp. rate 18, height 5\' 2"  (1.575 m), weight 217 lb 6 oz (98.6 kg), SpO2 98 %.   General: Well developed, well nourished,Ill-appearing on vent Head:  Eyes PERRLA, No xanthomas.  ET tube in place Normal cephalic and atramatic  Lungs:   Bilateral rhonchi/vent noise appreciated. Heart:   HRRR S1 S2 Pulses are 2+ & equal. 2/6 systolic right upper sternal border murmur,no rubs, gallops.  No carotid bruit. No JVD.  No abdominal bruits.  Abdomen: Bowel sounds are positive, abdomen soft and non-tender without masses. No hepatosplenomegaly. Msk:  Back normal. Normal strength and tone for age. Extremities:  Chronic appearing redness/erythema noted lower extremities, mostly right.. No clubbing, cyanosis, trace to 1+ bilateral lower extremity edema.  DP +1 Neuro: Alert and moving all  extremities, following commands with ET tube in place GU: Deferred Rectal: Deferred Psych:  Anxious      Labs: Lab Results  Component Value Date   WBC 24.2 (H) 10/08/2016   HGB 9.3 (L) 10/08/2016   HCT 28.3 (L) 10/08/2016   MCV 95.6 10/08/2016   PLT 286 10/08/2016     Recent Labs Lab Jul 19, 2016 1453 10/08/16 0443  NA 136 135  K 4.4 3.6  CL 98* 100*  CO2 24 24  BUN 65* 62*  CREATININE 2.17* 2.08*  CALCIUM 8.9 7.8*  PROT 6.5  --   BILITOT 1.0  --   ALKPHOS 94  --   ALT 20  --   AST 65*  --   GLUCOSE 313* 277*    Recent Labs  Jul 19, 2016 1640 Jul 19, 2016 2243 10/08/16 0443  TROPONINI 5.65* 26.52* 45.70*   No results found for: CHOL, HDL, LDLCALC, TRIG Lab Results  Component Value Date   DDIMER (H) 01/21/2010    2.39        AT THE INHOUSE ESTABLISHED CUTOFF VALUE OF 0.48 ug/mL FEU, THIS ASSAY HAS BEEN DOCUMENTED IN THE LITERATURE TO HAVE A SENSITIVITY AND NEGATIVE PREDICTIVE VALUE OF AT LEAST 98 TO 99%.  THE TEST RESULT SHOULD BE CORRELATED WITH AN ASSESSMENT OF THE CLINICAL PROBABILITY OF DVT / VTE.     Radiology:  Dg Chest Port 1 View  Result Date: 04/27/16 CLINICAL DATA:  Extubation. EXAM: PORTABLE CHEST 1 VIEW COMPARISON:  Chest radiograph 04/27/16. FINDINGS: ET tube terminates in the mid trachea. Enteric tube courses inferior to  the diaphragm. Left IJ central venous catheter tip projects over the superior cavoatrial junction. Pacer apparatus overlies the central chest. Stable cardiomegaly. Slight interval improvement in previously described diffuse bilateral consolidation, predominantly within the left upper lung. No definite pleural effusion or pneumothorax. IMPRESSION: ET tube terminates in the mid trachea. Slight interval improvement consolidation left upper lung. Otherwise persistent diffuse bilateral right-greater-than-left pulmonary consolidation. Electronically Signed   By: Annia Beltrew  Davis M.D.   On: 04/27/16 21:51   Dg Chest Port 1 View  Result Date: 04/27/16 CLINICAL DATA:  Central line placement EXAM: PORTABLE CHEST 1 VIEW COMPARISON:  Earlier same day FINDINGS: Newly placed internal jugular central line tip is in the SVC 3 cm above the right atrium. Endotracheal tube as well positioned 2 cm above the carina. Nasogastric tube enters the abdomen. Bilateral airspace density consistent with acute edema and/or pneumonia persists. IMPRESSION: Left internal jugular central line well positioned with its tip in the SVC above the right atrium. No complication relative to that. No other change. Electronically Signed   By: Paulina FusiMark  Shogry M.D.   On: 04/27/16 17:46   Dg Chest Portable 1 View  Result Date: 04/27/16 CLINICAL DATA:  Respiratory failure. EXAM: PORTABLE CHEST 1 VIEW COMPARISON:  Radiographs March 21, 2016. FINDINGS: Stable cardiomediastinal silhouette. Endotracheal tube is projected over tracheal air shadow with distal tip 2 cm above the carina. Nasogastric tube is seen entering the stomach. No pneumothorax is noted. Interval development of bilateral perihilar opacities, right greater than left concerning for edema or possibly pneumonia. No significant pleural effusion is noted. Bony thorax is unremarkable. IMPRESSION: Endotracheal and nasogastric tubes in grossly good position. Bilateral perihilar opacities, right  greater than left, concerning for edema or pneumonia. Electronically Signed   By: Lupita RaiderJames  Green Jr, M.D.   On: 04/27/16 15:26   Dg Abd Portable 1v  Result Date: 04/27/16 CLINICAL DATA:  OG tube placement. EXAM: PORTABLE ABDOMEN -  1 VIEW COMPARISON:  None. FINDINGS: Tip and side port of the enteric tube below the diaphragm in the stomach. No dilated bowel loops to suggest obstruction. No evidence of free air on supine view. Postsurgical change in the lower lumbar spine and left hip. IMPRESSION: Tip and side port of the enteric tube below the diaphragm in the stomach. Electronically Signed   By: Rubye OaksMelanie  Ehinger M.D.   On: 09/26/2016 21:54   Personally viewed.  EKG:  10/08/16-Sinus rhythm left bundle branch block heart rate 86 bpm Personally viewed.   ASSESSMENT/PLAN:    82 year old female with acute hypoxic respiratory failure, subsequent 3 minutes of cardiac arrest requiring CPR with non-ST elevation myocardial infarction, troponin 45, on ventilator antibiotics being treated with septic shock with left bundle branch block that appears new from previous EKG in June 2017.  Non-ST elevation myocardial infarction  - Significantly elevated troponin, 45  - IV heparin, aspirin, statin  - Unable to utilize beta blocker currently because of septic shock and need for pressor agents  - Echocardiogram has been ordered.  - Creatinine 2  - After she is able to get over her acute aspiration pneumonia, respiratory failure, it would not be unreasonable to pursue diagnostic cardiac catheterization given her significant elevation in troponin which seems out of proportion to isolated demand ischemia or troponin elevation post CPR.  Mild aortic stenosis  - Murmur appreciated, should be of no clinical consequence  Septic shock  - Pressor agents, fluid resuscitation, IV antibiotics per critical care  Critical care time 35 minutes spent with patient, review of extensive data elements in this critically ill  patient with myocardial infarction as well as respiratory failure and septic shock.  Donato SchultzMark Skains, MD  10/08/2016  12:39 PM

## 2016-10-08 NOTE — Progress Notes (Signed)
eLink Physician-Brief Progress Note% Patient Name: Lenora Boysnita M Waheed DOB: 04/20/1932 MRN: [id number]   Date of Service  10/08/2016  HPI/Events of Note  Sat = 91%.  eICU Interventions  Will order: 1. Increase PEEP to 12.  2. Portable CXR STAT.     Intervention Category Major Interventions: Hypoxemia - evaluation and management  Lenell AntuSommer,Steven Eugene 10/08/2016, 9:34 PM

## 2016-10-08 NOTE — Progress Notes (Signed)
PCCM Progress Note  Admission date: 09/10/2016 Referring provider: Dr. Juleen ChinaKohut, ER  CC: Dyspnea  HPI: 82 yo female presented with dyspnea, hypoxia, altered mental status.  Intubated in ER with brief CPR (3 minutes for ROSC).  PMHx intermittent LBBB, chronic diastolic CHF, CKD 3, HTN, Hypothyroidism, Gout, HLD, DM  Subjective: Denies chest/abd pain.  Vital signs: BP (!) 123/50   Pulse 94   Temp 100.1 F (37.8 C) (Oral)   Resp (!) 25   Ht 5\' 2"  (1.575 m)   Wt 217 lb 6 oz (98.6 kg)   SpO2 97%   BMI 39.76 kg/m   Intake/output: I/O last 3 completed shifts: In: 4686.3 [I.V.:4486.3; IV Piggyback:200] Out: 250 [Urine:250]  General: anxious Neuro: follows commands HEENT: ETT in place Cardiac: regular, no murmur Chest: b/l crackles Abd: soft, + bowel sounds, non tender Ext: 1+ edema Skin: chronic redness Rt lower leg   CMP Latest Ref Rng & Units 10/08/2016 09/18/2016 04/06/2016  Glucose 65 - 99 mg/dL 119(J277(H) 478(G313(H) 95(A59(L)  BUN 6 - 20 mg/dL 21(H62(H) 08(M65(H) 57(Q43(H)  Creatinine 0.44 - 1.00 mg/dL 4.69(G2.08(H) 2.95(M2.17(H) 8.41(L2.01(H)  Sodium 135 - 145 mmol/L 135 136 142  Potassium 3.5 - 5.1 mmol/L 3.6 4.4 4.2  Chloride 101 - 111 mmol/L 100(L) 98(L) 99  CO2 22 - 32 mmol/L 24 24 28   Calcium 8.9 - 10.3 mg/dL 7.8(L) 8.9 9.8  Total Protein 6.5 - 8.1 g/dL - 6.5 -  Total Bilirubin 0.3 - 1.2 mg/dL - 1.0 -  Alkaline Phos 38 - 126 U/L - 94 -  AST 15 - 41 U/L - 65(H) -  ALT 14 - 54 U/L - 20 -     CBC Latest Ref Rng & Units 10/08/2016 09/29/2016 03/23/2016  WBC 4.0 - 10.5 K/uL 24.2(H) 23.5(H) 10.8(H)  Hemoglobin 12.0 - 15.0 g/dL 2.4(M9.3(L) 11.3(L) 9.0(L)  Hematocrit 36.0 - 46.0 % 28.3(L) 34.0(L) 28.2(L)  Platelets 150 - 400 K/uL 286 331 220    ABG    Component Value Date/Time   PHART 7.418 10/08/2016 0420   PCO2ART 34.0 10/08/2016 0420   PO2ART 59.8 (L) 10/08/2016 0420   HCO3 21.8 10/08/2016 0420   TCO2 26 09/20/2016 1612   ACIDBASEDEF 2.2 (H) 10/08/2016 0420   O2SAT 92.2 10/08/2016 0420    CBG  (last 3)   Recent Labs  10/08/2016 2352 10/08/16 0449 10/08/16 0835  GLUCAP 339* 283* 224*    Imaging: Dg Chest Port 1 View  Result Date: 10/01/2016 CLINICAL DATA:  Extubation. EXAM: PORTABLE CHEST 1 VIEW COMPARISON:  Chest radiograph 09/19/2016. FINDINGS: ET tube terminates in the mid trachea. Enteric tube courses inferior to the diaphragm. Left IJ central venous catheter tip projects over the superior cavoatrial junction. Pacer apparatus overlies the central chest. Stable cardiomegaly. Slight interval improvement in previously described diffuse bilateral consolidation, predominantly within the left upper lung. No definite pleural effusion or pneumothorax. IMPRESSION: ET tube terminates in the mid trachea. Slight interval improvement consolidation left upper lung. Otherwise persistent diffuse bilateral right-greater-than-left pulmonary consolidation. Electronically Signed   By: Annia Beltrew  Davis M.D.   On: 09/19/2016 21:51   Dg Chest Port 1 View  Result Date: 10/01/2016 CLINICAL DATA:  Central line placement EXAM: PORTABLE CHEST 1 VIEW COMPARISON:  Earlier same day FINDINGS: Newly placed internal jugular central line tip is in the SVC 3 cm above the right atrium. Endotracheal tube as well positioned 2 cm above the carina. Nasogastric tube enters the abdomen. Bilateral airspace density consistent with acute edema and/or pneumonia persists.  IMPRESSION: Left internal jugular central line well positioned with its tip in the SVC above the right atrium. No complication relative to that. No other change. Electronically Signed   By: Paulina FusiMark  Shogry M.D.   On: 09/08/2016 17:46   Dg Chest Portable 1 View  Result Date: 09/30/2016 CLINICAL DATA:  Respiratory failure. EXAM: PORTABLE CHEST 1 VIEW COMPARISON:  Radiographs March 21, 2016. FINDINGS: Stable cardiomediastinal silhouette. Endotracheal tube is projected over tracheal air shadow with distal tip 2 cm above the carina. Nasogastric tube is seen entering the  stomach. No pneumothorax is noted. Interval development of bilateral perihilar opacities, right greater than left concerning for edema or possibly pneumonia. No significant pleural effusion is noted. Bony thorax is unremarkable. IMPRESSION: Endotracheal and nasogastric tubes in grossly good position. Bilateral perihilar opacities, right greater than left, concerning for edema or pneumonia. Electronically Signed   By: Lupita RaiderJames  Green Jr, M.D.   On: 09/29/2016 15:26   Dg Abd Portable 1v  Result Date: 09/16/2016 CLINICAL DATA:  OG tube placement. EXAM: PORTABLE ABDOMEN - 1 VIEW COMPARISON:  None. FINDINGS: Tip and side port of the enteric tube below the diaphragm in the stomach. No dilated bowel loops to suggest obstruction. No evidence of free air on supine view. Postsurgical change in the lower lumbar spine and left hip. IMPRESSION: Tip and side port of the enteric tube below the diaphragm in the stomach. Electronically Signed   By: Rubye OaksMelanie  Ehinger M.D.   On: 09/19/2016 21:54    Studies: Echo 12/31 >>  Antibiotics: Levaquin 12/30 >> Azactam 12/30 >> Vancomycin 12/30 >>  Cultures: Blood 12/30 >> Sputum 12/30 >> Pneumococcal Ag 12/30 >> Legionella Ag 12/30 >>  Urine 12/30 >>  Lines/tubes: ETT 12/30 >> Lt IJ CVL 12/30 >>  Events: 12/30 Admit 12/31 Cardiology consulted  Summary: 82 yo female with acute hypoxic respiratory failure 2nd to pneumonia.  Complicated by NSTEMI with LBBB, septic shock.  Assessment/plan:  Acute respiratory failure with hypoxia 2nd to pneumonia likely from aspiration. - full vent support - f/u CXR - prn BDs - continue ABx  Sepsis shock with lactic acidosis. - pressors to keep MAP > 65  NSTEMI with LBBB. Acute on chronic diastolic CHF, HLD. - heparin gtt - ASA, lipitor - no beta blocker in setting of septic shock - consulted cardiology 12/31 - f/u Echo  CKD 3. - monitor renal fx  DM type II. Hx of hypothyroidism. - SSI - continue  synthroid  Acute metabolic encephalopathy. - RASS goal 0 to -1  DVT prophylaxis - heparin gtt SUP - Protonix Nutrition - tube feeds Goals of care - full code  Updated pt's family at bedside  CC time 44 minutes  Coralyn HellingVineet Eydie Wormley, MD [facility] 10/08/2016, 11:17 AM Pager:  [phone] After 3pm call: [phone]

## 2016-10-08 NOTE — Progress Notes (Signed)
ANTICOAGULATION CONSULT NOTE - Follow Up Consult  Pharmacy Consult for heparin Indication: chest pain/ACS  Labs:  Recent Labs  10/05/2016 1453 09/29/2016 1640 10/08/16 0000  HGB 11.3*  --   --   HCT 34.0*  --   --   PLT 331  --   --   LABPROT 15.1  --   --   INR 1.18  --   --   HEPARINUNFRC  --   --  0.82*  CREATININE 2.17*  --   --   TROPONINI  --  5.65*  --     Assessment: 82yo female above goal on heparin w/ initial dosing for elevated troponin s/p brief PEA arrest.  Goal of Therapy:  Heparin level 0.3-0.7 units/ml   Plan:  Will decrease heparin gtt by 1-2 units/kg/hr to 800 units/hr and check level in 8hr.  Vernard GamblesVeronda Daijah Scrivens, PharmD, BCPS  10/08/2016,12:36 AM

## 2016-10-09 ENCOUNTER — Inpatient Hospital Stay (HOSPITAL_COMMUNITY): Payer: PPO

## 2016-10-09 DIAGNOSIS — K7200 Acute and subacute hepatic failure without coma: Secondary | ICD-10-CM | POA: Diagnosis not present

## 2016-10-09 DIAGNOSIS — E871 Hypo-osmolality and hyponatremia: Secondary | ICD-10-CM

## 2016-10-09 DIAGNOSIS — Z66 Do not resuscitate: Secondary | ICD-10-CM | POA: Diagnosis not present

## 2016-10-09 DIAGNOSIS — E872 Acidosis: Secondary | ICD-10-CM | POA: Diagnosis not present

## 2016-10-09 DIAGNOSIS — R001 Bradycardia, unspecified: Secondary | ICD-10-CM | POA: Diagnosis not present

## 2016-10-09 DIAGNOSIS — Z6841 Body Mass Index (BMI) 40.0 and over, adult: Secondary | ICD-10-CM | POA: Diagnosis not present

## 2016-10-09 DIAGNOSIS — R042 Hemoptysis: Secondary | ICD-10-CM | POA: Diagnosis not present

## 2016-10-09 DIAGNOSIS — J690 Pneumonitis due to inhalation of food and vomit: Secondary | ICD-10-CM | POA: Diagnosis not present

## 2016-10-09 DIAGNOSIS — R748 Abnormal levels of other serum enzymes: Secondary | ICD-10-CM

## 2016-10-09 DIAGNOSIS — N2581 Secondary hyperparathyroidism of renal origin: Secondary | ICD-10-CM | POA: Diagnosis not present

## 2016-10-09 DIAGNOSIS — J9601 Acute respiratory failure with hypoxia: Secondary | ICD-10-CM | POA: Diagnosis not present

## 2016-10-09 DIAGNOSIS — N289 Disorder of kidney and ureter, unspecified: Secondary | ICD-10-CM

## 2016-10-09 DIAGNOSIS — I5033 Acute on chronic diastolic (congestive) heart failure: Secondary | ICD-10-CM | POA: Diagnosis not present

## 2016-10-09 DIAGNOSIS — N170 Acute kidney failure with tubular necrosis: Secondary | ICD-10-CM | POA: Diagnosis not present

## 2016-10-09 DIAGNOSIS — I214 Non-ST elevation (NSTEMI) myocardial infarction: Secondary | ICD-10-CM | POA: Diagnosis not present

## 2016-10-09 DIAGNOSIS — N184 Chronic kidney disease, stage 4 (severe): Secondary | ICD-10-CM | POA: Diagnosis not present

## 2016-10-09 DIAGNOSIS — A419 Sepsis, unspecified organism: Secondary | ICD-10-CM

## 2016-10-09 DIAGNOSIS — R6520 Severe sepsis without septic shock: Secondary | ICD-10-CM

## 2016-10-09 DIAGNOSIS — E1122 Type 2 diabetes mellitus with diabetic chronic kidney disease: Secondary | ICD-10-CM | POA: Diagnosis not present

## 2016-10-09 DIAGNOSIS — I130 Hypertensive heart and chronic kidney disease with heart failure and stage 1 through stage 4 chronic kidney disease, or unspecified chronic kidney disease: Secondary | ICD-10-CM | POA: Diagnosis not present

## 2016-10-09 DIAGNOSIS — R6521 Severe sepsis with septic shock: Secondary | ICD-10-CM | POA: Diagnosis not present

## 2016-10-09 DIAGNOSIS — B961 Klebsiella pneumoniae [K. pneumoniae] as the cause of diseases classified elsewhere: Secondary | ICD-10-CM | POA: Diagnosis not present

## 2016-10-09 DIAGNOSIS — R0603 Acute respiratory distress: Secondary | ICD-10-CM | POA: Diagnosis not present

## 2016-10-09 DIAGNOSIS — I469 Cardiac arrest, cause unspecified: Secondary | ICD-10-CM | POA: Diagnosis not present

## 2016-10-09 DIAGNOSIS — N390 Urinary tract infection, site not specified: Secondary | ICD-10-CM | POA: Diagnosis not present

## 2016-10-09 DIAGNOSIS — G9341 Metabolic encephalopathy: Secondary | ICD-10-CM | POA: Diagnosis not present

## 2016-10-09 DIAGNOSIS — R34 Anuria and oliguria: Secondary | ICD-10-CM | POA: Diagnosis not present

## 2016-10-09 DIAGNOSIS — I447 Left bundle-branch block, unspecified: Secondary | ICD-10-CM | POA: Diagnosis not present

## 2016-10-09 LAB — CBC
HCT: 25.2 % — ABNORMAL LOW (ref 36.0–46.0)
HEMOGLOBIN: 8.4 g/dL — AB (ref 12.0–15.0)
MCH: 31.5 pg (ref 26.0–34.0)
MCHC: 33.3 g/dL (ref 30.0–36.0)
MCV: 94.4 fL (ref 78.0–100.0)
Platelets: 294 10*3/uL (ref 150–400)
RBC: 2.67 MIL/uL — ABNORMAL LOW (ref 3.87–5.11)
RDW: 15.6 % — AB (ref 11.5–15.5)
WBC: 20.3 10*3/uL — ABNORMAL HIGH (ref 4.0–10.5)

## 2016-10-09 LAB — GLUCOSE, CAPILLARY
GLUCOSE-CAPILLARY: 212 mg/dL — AB (ref 65–99)
GLUCOSE-CAPILLARY: 264 mg/dL — AB (ref 65–99)
GLUCOSE-CAPILLARY: 290 mg/dL — AB (ref 65–99)
GLUCOSE-CAPILLARY: 317 mg/dL — AB (ref 65–99)
GLUCOSE-CAPILLARY: 331 mg/dL — AB (ref 65–99)
GLUCOSE-CAPILLARY: 336 mg/dL — AB (ref 65–99)
GLUCOSE-CAPILLARY: 345 mg/dL — AB (ref 65–99)
GLUCOSE-CAPILLARY: 351 mg/dL — AB (ref 65–99)
Glucose-Capillary: 307 mg/dL — ABNORMAL HIGH (ref 65–99)
Glucose-Capillary: 354 mg/dL — ABNORMAL HIGH (ref 65–99)
Glucose-Capillary: 362 mg/dL — ABNORMAL HIGH (ref 65–99)
Glucose-Capillary: 373 mg/dL — ABNORMAL HIGH (ref 65–99)
Glucose-Capillary: 286 mg/dL — ABNORMAL HIGH (ref 65–99)
Glucose-Capillary: 253 mg/dL — ABNORMAL HIGH (ref 65–99)

## 2016-10-09 LAB — MAGNESIUM
MAGNESIUM: 2.2 mg/dL (ref 1.7–2.4)
Magnesium: 2.1 mg/dL (ref 1.7–2.4)

## 2016-10-09 LAB — BLOOD GAS, ARTERIAL
ACID-BASE DEFICIT: 3.5 mmol/L — AB (ref 0.0–2.0)
BICARBONATE: 20.6 mmol/L (ref 20.0–28.0)
Drawn by: 313941
FIO2: 90
LHR: 22 {breaths}/min
O2 Saturation: 97.3 %
PEEP/CPAP: 14 cmH2O
PH ART: 7.391 (ref 7.350–7.450)
Patient temperature: 99.2
VT: 340 mL
pCO2 arterial: 34.8 mmHg (ref 32.0–48.0)
pO2, Arterial: 98.6 mmHg (ref 83.0–108.0)

## 2016-10-09 LAB — URINE CULTURE

## 2016-10-09 LAB — PHOSPHORUS
PHOSPHORUS: 3.5 mg/dL (ref 2.5–4.6)
Phosphorus: 3.4 mg/dL (ref 2.5–4.6)

## 2016-10-09 LAB — BASIC METABOLIC PANEL
ANION GAP: 13 (ref 5–15)
BUN: 71 mg/dL — ABNORMAL HIGH (ref 6–20)
CALCIUM: 7.9 mg/dL — AB (ref 8.9–10.3)
CHLORIDE: 95 mmol/L — AB (ref 101–111)
CO2: 21 mmol/L — ABNORMAL LOW (ref 22–32)
CREATININE: 2.48 mg/dL — AB (ref 0.44–1.00)
GFR calc non Af Amer: 17 mL/min — ABNORMAL LOW (ref >60)
GFR, EST AFRICAN AMERICAN: 20 mL/min — AB (ref >60)
GLUCOSE: 314 mg/dL — AB (ref 65–99)
Potassium: 3.6 mmol/L (ref 3.5–5.1)
Sodium: 129 mmol/L — ABNORMAL LOW (ref 135–145)

## 2016-10-09 LAB — LEGIONELLA PNEUMOPHILA SEROGP 1 UR AG: L. pneumophila Serogp 1 Ur Ag: NEGATIVE

## 2016-10-09 LAB — HEPARIN LEVEL (UNFRACTIONATED): HEPARIN UNFRACTIONATED: 0.39 [IU]/mL (ref 0.30–0.70)

## 2016-10-09 LAB — TRIGLYCERIDES: TRIGLYCERIDES: 208 mg/dL — AB (ref <150)

## 2016-10-09 MED ORDER — INSULIN GLARGINE 100 UNIT/ML ~~LOC~~ SOLN: 10.0000 [IU] | Freq: Every day | SUBCUTANEOUS | Status: DC

## 2016-10-09 MED ORDER — SODIUM CHLORIDE 0.9 % IV SOLN
0.0000 mg/h | INTRAVENOUS | Status: DC
  Administered 2016-10-09: 2 mg/h via INTRAVENOUS
  Filled 2016-10-09: qty 10

## 2016-10-09 MED ORDER — FENTANYL CITRATE (PF) 100 MCG/2ML IJ SOLN
50.0000 ug | Freq: Once | INTRAMUSCULAR | Status: AC
  Administered 2016-10-09: 50 ug via INTRAVENOUS

## 2016-10-09 MED ORDER — EPINEPHRINE PF 1 MG/ML IJ SOLN
0.5000 ug/min | INTRAVENOUS | Status: DC
  Administered 2016-10-09: 0.5 ug/min via INTRAVENOUS
  Administered 2016-10-10: 10 ug/min via INTRAVENOUS
  Filled 2016-10-09 (×2): qty 4

## 2016-10-09 MED ORDER — FENTANYL BOLUS VIA INFUSION
25.0000 ug | INTRAVENOUS | Status: DC | PRN
  Administered 2016-10-10: 25 ug via INTRAVENOUS
  Filled 2016-10-09: qty 25

## 2016-10-09 MED ORDER — MIDAZOLAM BOLUS VIA INFUSION
1.0000 mg | INTRAVENOUS | Status: DC | PRN
  Filled 2016-10-09: qty 2

## 2016-10-09 MED ORDER — VASOPRESSIN 20 UNIT/ML IV SOLN
0.0300 [IU]/min | INTRAVENOUS | Status: DC
  Administered 2016-10-09: 0.03 [IU]/min via INTRAVENOUS
  Filled 2016-10-09: qty 2

## 2016-10-09 MED ORDER — FENTANYL 2500MCG IN NS 250ML (10MCG/ML) PREMIX INFUSION
25.0000 ug/h | INTRAVENOUS | Status: DC
  Administered 2016-10-09: 100 ug/h via INTRAVENOUS
  Filled 2016-10-09: qty 250

## 2016-10-09 MED ORDER — INSULIN ASPART 100 UNIT/ML ~~LOC~~ SOLN
0.0000 [IU] | SUBCUTANEOUS | Status: DC
  Administered 2016-10-09: 15 [IU] via SUBCUTANEOUS

## 2016-10-09 MED ORDER — NOREPINEPHRINE BITARTRATE 1 MG/ML IV SOLN
0.0000 ug/min | INTRAVENOUS | Status: DC
  Administered 2016-10-09: 10 ug/min via INTRAVENOUS
  Administered 2016-10-09: 78 ug/min via INTRAVENOUS
  Administered 2016-10-10 (×2): 75 ug/min via INTRAVENOUS
  Filled 2016-10-09 (×5): qty 16

## 2016-10-09 MED ORDER — INSULIN REGULAR HUMAN 100 UNIT/ML IJ SOLN
INTRAMUSCULAR | Status: DC
  Administered 2016-10-09: 2.9 [IU]/h via INTRAVENOUS
  Filled 2016-10-09: qty 2.5

## 2016-10-09 NOTE — Progress Notes (Signed)
Subjective:  On ventilator; mildly sedated  Objective:   Vital Signs : Vitals:   10/09/16 0500 10/09/16 0600 10/09/16 0700 10/09/16 0800  BP: (!) 110/59 (!) 117/59  (!) 97/55  Pulse: 98 98  (!) 102  Resp: (!) 24 (!) 26  (!) 26  Temp:   99.2 F (37.3 C)   TempSrc:   Oral   SpO2: 96% 95%  95%  Weight: 219 lb 5.7 oz (99.5 kg)     Height:        Intake/Output from previous day:  Intake/Output Summary (Last 24 hours) at 10/09/16 0903 Last data filed at 10/09/16 0800  Gross per 24 hour  Intake          3373.68 ml  Output              860 ml  Net          2513.68 ml    I/O since admission: +7320  Wt Readings from Last 3 Encounters:  10/09/16 219 lb 5.7 oz (99.5 kg)  09/18/16 197 lb (89.4 kg)  04/06/16 192 lb (87.1 kg)    Medications: . aspirin  81 mg Per Tube Daily  . atorvastatin  40 mg Per Tube Daily  . aztreonam  1 g Intravenous Q8H  . chlorhexidine gluconate (MEDLINE KIT)  15 mL Mouth Rinse BID  . insulin aspart  0-15 Units Subcutaneous Q4H  . levofloxacin (LEVAQUIN) IV  500 mg Intravenous Q48H  . levothyroxine  25 mcg Per Tube QAC breakfast  . mouth rinse  15 mL Mouth Rinse QID  . multivitamin  15 mL Per Tube Daily  . pantoprazole sodium  40 mg Per Tube Q24H  . sodium chloride flush  10-40 mL Intracatheter Q12H  . vancomycin  1,000 mg Intravenous Q24H    . feeding supplement (VITAL HIGH PROTEIN) 1,000 mL (10/09/16 0800)  . heparin 700 Units/hr (10/09/16 0800)  . lactated ringers 40 mL/hr at 10/09/16 0800  . norepinephrine (LEVOPHED) Adult infusion 8 mcg/min (10/09/16 0800)  . propofol (DIPRIVAN) infusion 20 mcg/kg/min (10/09/16 9485)    Physical Exam:   General appearance: no distress Neck: no adenopathy, no carotid bruit, no JVD and supple, symmetrical, trachea midline Lungs: rhocho; no wheezing Heart: regular rhythm but tachycardic at 462; 7-0/3 systolic murmur USB; no s3 Abdomen: BS + Extremities: trace edema with erythema to distal pretibial  area Skin: erythema lower pretibial region Neurologic: Grossly normal   Rate: 118  Rhythm: sinus tachycardia  ECG (independently read by me): NSR at 84; LBBB  Lab Results:   Recent Labs  09/08/2016 1453  10/08/16 0443 10/08/16 1130 10/08/16 1614 10/09/16 0500  NA 136  --  135  --   --  129*  K 4.4  --  3.6  --   --  3.6  CL 98*  --  100*  --   --  95*  CO2 24  --  24  --   --  21*  GLUCOSE 313*  --  277*  --   --  314*  BUN 65*  --  62*  --   --  71*  CREATININE 2.17*  --  2.08*  --   --  2.48*  CALCIUM 8.9  --  7.8*  --   --  7.9*  MG  --   < > 2.2 2.1 2.1 2.2  PHOS  --   < > 3.6 3.3 3.3 3.4  < > = values in this interval  not displayed.  Hepatic Function Latest Ref Rng & Units 09/22/2016 03/21/2016 03/20/2016  Total Protein 6.5 - 8.1 g/dL 6.5 6.4(L) 6.6  Albumin 3.5 - 5.0 g/dL 2.8(L) 3.0(L) 3.2(L)  AST 15 - 41 U/L 65(H) 42(H) 26  ALT 14 - 54 U/L '20 27 17  ' Alk Phosphatase 38 - 126 U/L 94 76 74  Total Bilirubin 0.3 - 1.2 mg/dL 1.0 0.8 1.0     Recent Labs  09/22/2016 1453 10/08/16 0443 10/09/16 0500  WBC 23.5* 24.2* 20.3*  NEUTROABS 16.7*  --   --   HGB 11.3* 9.3* 8.4*  HCT 34.0* 28.3* 25.2*  MCV 97.7 95.6 94.4  PLT 331 286 294     Recent Labs  09/30/2016 1640 09/17/2016 2243 10/08/16 0443  TROPONINI 5.65* 26.52* 45.70*    Lab Results  Component Value Date   TSH 1.323 03/19/2016    Recent Labs  09/13/2016 1640  HGBA1C 6.1*     Recent Labs  09/23/2016 1453  PROT 6.5  ALBUMIN 2.8*  AST 65*  ALT 20  ALKPHOS 94  BILITOT 1.0    Recent Labs  09/28/2016 1453  INR 1.18   BNP (last 3 results)  Recent Labs  03/19/16 2112  BNP 349.2*    ProBNP (last 3 results) No results for input(s): PROBNP in the last 8760 hours.   Lipid Panel     Component Value Date/Time   TRIG 208 (H) 10/09/2016 0500      Imaging:  Dg Chest Port 1 View  Result Date: 10/09/2016 CLINICAL DATA:  Acute respiratory failure EXAM: PORTABLE CHEST 1 VIEW COMPARISON:   10/08/2016 FINDINGS: Endotracheal tube is in place, tip approximately 9 mm above the carina. A left IJ central line tip overlies the level of the lower superior vena cava. Nasogastric tube is in place, tip off the film beyond the gastroesophageal junction. The heart is upper normal in size. There are patchy infiltrates bilaterally which partially obscure the hemidiaphragms bilaterally. Suspect bilateral pleural effusions. IMPRESSION: 1. Endotracheal tube just above the carina. 2. Persistent patchy airspace filling bilaterally. Electronically Signed   By: Nolon Nations M.D.   On: 10/09/2016 07:33   Dg Chest Port 1 View  Result Date: 10/08/2016 CLINICAL DATA:  Respiratory failure EXAM: PORTABLE CHEST 1 VIEW COMPARISON:  09/22/2016 FINDINGS: Endotracheal tube tip is just above the carina. Esophageal tube tip is below the diaphragm but is not included. Left-sided central venous catheter tip partially obscured by overlying support leads, but appears to overlie the SVC. Patch obscures the central upper chest. There is slightly improved aeration of the right thorax. Suspect small bilateral effusions. Stable cardiomediastinal silhouette with persistent central congestion and perihilar edema or infiltrates as well as bilateral lung base atelectasis or infiltrates. No pneumothorax. IMPRESSION: 1. Endotracheal tube tip is just above the carina. 2. Mildly improved aeration bilaterally. 3. Probable small pleural effusions. Moderate residual perihilar and bilateral lung base infiltrates. Electronically Signed   By: Donavan Foil M.D.   On: 10/08/2016 22:28   Dg Chest Port 1 View  Result Date: 09/29/2016 CLINICAL DATA:  Extubation. EXAM: PORTABLE CHEST 1 VIEW COMPARISON:  Chest radiograph 10/04/2016. FINDINGS: ET tube terminates in the mid trachea. Enteric tube courses inferior to the diaphragm. Left IJ central venous catheter tip projects over the superior cavoatrial junction. Pacer apparatus overlies the central  chest. Stable cardiomegaly. Slight interval improvement in previously described diffuse bilateral consolidation, predominantly within the left upper lung. No definite pleural effusion or pneumothorax. IMPRESSION: ET tube  terminates in the mid trachea. Slight interval improvement consolidation left upper lung. Otherwise persistent diffuse bilateral right-greater-than-left pulmonary consolidation. Electronically Signed   By: Lovey Newcomer M.D.   On: 10/08/2016 21:51   Dg Chest Port 1 View  Result Date: 09/14/2016 CLINICAL DATA:  Central line placement EXAM: PORTABLE CHEST 1 VIEW COMPARISON:  Earlier same day FINDINGS: Newly placed internal jugular central line tip is in the SVC 3 cm above the right atrium. Endotracheal tube as well positioned 2 cm above the carina. Nasogastric tube enters the abdomen. Bilateral airspace density consistent with acute edema and/or pneumonia persists. IMPRESSION: Left internal jugular central line well positioned with its tip in the SVC above the right atrium. No complication relative to that. No other change. Electronically Signed   By: Nelson Chimes M.D.   On: 09/11/2016 17:46   Dg Chest Portable 1 View  Result Date: 09/12/2016 CLINICAL DATA:  Respiratory failure. EXAM: PORTABLE CHEST 1 VIEW COMPARISON:  Radiographs March 21, 2016. FINDINGS: Stable cardiomediastinal silhouette. Endotracheal tube is projected over tracheal air shadow with distal tip 2 cm above the carina. Nasogastric tube is seen entering the stomach. No pneumothorax is noted. Interval development of bilateral perihilar opacities, right greater than left concerning for edema or possibly pneumonia. No significant pleural effusion is noted. Bony thorax is unremarkable. IMPRESSION: Endotracheal and nasogastric tubes in grossly good position. Bilateral perihilar opacities, right greater than left, concerning for edema or pneumonia. Electronically Signed   By: Marijo Conception, M.D.   On: 09/16/2016 15:26   Dg Abd  Portable 1v  Result Date: 09/11/2016 CLINICAL DATA:  OG tube placement. EXAM: PORTABLE ABDOMEN - 1 VIEW COMPARISON:  None. FINDINGS: Tip and side port of the enteric tube below the diaphragm in the stomach. No dilated bowel loops to suggest obstruction. No evidence of free air on supine view. Postsurgical change in the lower lumbar spine and left hip. IMPRESSION: Tip and side port of the enteric tube below the diaphragm in the stomach. Electronically Signed   By: Jeb Levering M.D.   On: 09/11/2016 21:54      Assessment/Plan:   Active Problems:   Acute respiratory failure ([place])  83 year old female with acute hypoxic respiratory failure, subsequent 3 minutes of cardiac arrest requiring CPR with non-ST elevation myocardial infarction, troponin 45, on ventilator antibiotics being treated with septic shock with left bundle branch block that appears new from previous EKG in June 2017.  1.Non-ST elevation myocardial infarction  - Significantly elevated troponin, 45  - IV heparin, aspirin, statin  - Unable to utilize beta blocker currently because of septic shock and need for pressor agents  - Echocardiogram has been ordered; not yet done  - Will ultimately need diagnostic cardiac catheterization given her significant elevation in troponin which seems out of proportion to isolated demand ischemia or troponin elevation post CPR if renal function improves. - Will f/u ECG today  2. Respiratory Failure with hypoxia 2nd to pneumonia likely from aspiration.  3. Mild aortic stenosis  - Murmur appreciated, Echo today  4. Septic shock  - on levophed, fluid resuscitation, IV antibiotics per critical care;  BP now 99 - 481 systolic  5. Renal Insufficiency; Cr increased to 2.48 today; making urine  6. Hyponatremia  7. Anemia: H/H decreased to 8.4/25.2  8. Mild AST elevation; need to monitor on atorvastatin   Troy Sine, MD, [facility] 10/09/2016, 9:03 AM

## 2016-10-09 NOTE — Progress Notes (Signed)
eLink Physician-Brief Progress Note Patient Name: Lenora Boysnita M Tzeng DOB: Sep 01, 1932 MRN: [id number]   Date of Service  10/09/2016  HPI/Events of Note  Levo really is at 80, thought to be at 40 cvp 18-14 Echo awaited  eICU Interventions  Add vaso in hopes to reduce other pressor needs May need cariogenic shock support     Intervention Category Major Interventions: Hypotension - evaluation and management  Kiano Terrien J. 10/09/2016, 7:51 PM

## 2016-10-09 NOTE — Progress Notes (Signed)
eLink Physician-Brief Progress Note Patient Name: Donna Hurst DOB: 03-Jul-1932 MRN: [id number]   Date of Service  10/09/2016  HPI/Events of Note  Shock remains Get off prop Add versed, fent   eICU Interventions       Intervention Category Major Interventions: Hypotension - evaluation and management  Nelda BucksFEINSTEIN,DANIEL J. 10/09/2016, 8:34 PM

## 2016-10-09 NOTE — Progress Notes (Signed)
Patients son brought back to be with mother at [id number] after speaking with security about units policies and compliance with those rules. Upon arrival into room, patients son was agitated and stated "Seriously? You call security on me?" I infomred him that security was called by other nurses because he was breaking rules that he knew were in place. I reminded him that he was in the room to spend time with his mother and celebrate the holiday with her and I was happy to let him do that until 12:15 as we had agreed on earlier and left the room. At approximately 0005, son was noted to walk through hallway and responded with derogatory language at 2 nurses who wished him a happy new year and exited the unit.

## 2016-10-09 NOTE — Progress Notes (Signed)
Inpatient Diabetes Program Recommendations  AACE/ADA: New Consensus Statement on Inpatient Glycemic Control (2015)  Target Ranges:  Prepandial:   less than 140 mg/dL      Peak postprandial:   less than 180 mg/dL (1-2 hours)      Critically ill patients:  140 - 180 mg/dL   Review of Glycemic Control  Diabetes history: DM 2 Outpatient Diabetes medications: Levemir 32 units BID, Novolog 10 units BID with lunch and supper Current orders for Inpatient glycemic control:  Lantus 10, Novolog Resistant Q 4hours  Inpatient Diabetes Program Recommendations:   Called Dr. Craige CottaSood. With cardiac involvement in the patient's current critical state d/c current glycemic control orders and initiate ICU Glycemic Control order set Phase 2 IV insulin. Will follow patient while here.  Thanks,  Christena DeemShannon Shariah Assad RN, MSN, [facility] Inpatient Diabetes Coordinator Team Pager [phone] (8a-5p)

## 2016-10-09 NOTE — Progress Notes (Signed)
Karen ChafeJody Roberts, RN informed this nurse that patients son had removed recliner chair from 4N and placed in hall with multiple blankets and that security had been contacted to inform him that that was not permitted.  Security updated on status and instructed this nurse to call them when the son came back because they could not find him at this time. Blankets and chair removed from hallway by security.

## 2016-10-09 NOTE — Progress Notes (Signed)
Patients son at bedside at 0000. At 0045 son was updated on patient status. Informed that visitors are not allowed to spend the night in ICU. Son informed that if he was adamant about staying that he would have to stay in the waiting room until 0800. Son stated "well you are going to have to ask me to leave." I informed him that I would give him time to say goodbye to his mother and collect his belongings. At approximately 0110 son was asked to step out to waiting room due to visiting hours over. Patietn requesting that he have a recliner chair to sleep in in the waiting room by the main door of the unit. Informed him that we did not allow bedding or recliners in the waiting room. Son again became agitated by this and asked if he could stay in the room with the patient. Rules reinforced to son by this nurse as well as Hillery AldoShanna Stowe, Consulting civil engineerCharge RN. PAtient was escorted out by charge RN at this time.

## 2016-10-09 NOTE — Progress Notes (Signed)
Patient's son at bedside, asked if he could stay in room until midnight. Son reminded that visiting hours ended at 2200. Son became agitated by this. After discussion, this nurse informed him that if would comply with visiting hours as posted then at 2345 I would bring him back to spend 30 minutes with his mother to "ring in the new year". Son was pleased with this and thanked this nurse.

## 2016-10-09 NOTE — Progress Notes (Signed)
At start of shift, realized pt was getting 80 mcg of Levophed. Pump had not been changed from single strength to quad strength. ELink notified, unable to titrate Levo down as MAP was holding at 60. New orders given. Despite new orders, unable to titrate Levo down to order parameters. MD aware of high dose Levo the pt is requiring. Will continue to monitor and update as needed.

## 2016-10-09 NOTE — Progress Notes (Signed)
This nurse was informed by Karen ChafeJody Roberts, RN that [facility] and informed her that patients son had been escorted from 4N waiting area off of [address] by security.

## 2016-10-09 NOTE — Progress Notes (Signed)
ANTICOAGULATION CONSULT NOTE  Pharmacy Consult for heparin Indication: chest pain/ACS   Patient Measurements:   Heparin Dosing Weight: 69 kg  Labs:  Recent Labs  10/08/2016 1453 10/06/2016 1640 09/22/2016 2243  10/08/16 0443  10/08/16 0845 10/08/16 1830 10/09/16 0500  HGB 11.3*  --   --   --  9.3*  --   --   --  8.4*  HCT 34.0*  --   --   --  28.3*  --   --   --  25.2*  PLT 331  --   --   --  286  --   --   --  294  LABPROT 15.1  --   --   --   --   --   --   --   --   INR 1.18  --   --   --   --   --   --   --   --   HEPARINUNFRC  --   --   --   < >  --   < > 0.74* 0.42 0.39  CREATININE 2.17*  --   --   --  2.08*  --   --   --  2.48*  TROPONINI  --  5.65* 26.52*  --  45.70*  --   --   --   --   < > = values in this interval not displayed.  Assessment: 83 yo female with respiratory distress. Pharmacy consulted to dose heparin for ACS/chest pain. Heparin level is now at goal on 700 units/hr.   Goal of Therapy:  Heparin level 0.3-0.7 units/ml   Plan:   -Continue heparin at 700 units/hr -Daily HL, CBC -Monitor plans for cardiac cath   Agapito GamesAlison Lavalle Skoda, PharmD, BCPS Clinical Pharmacist 10/09/2016 9:07 AM

## 2016-10-09 NOTE — Progress Notes (Signed)
eLink Physician-Brief Progress Note Patient Name: Donna Hurst DOB: Jun 18, 1932 MRN: [id number]   Date of Service  10/09/2016  HPI/Events of Note  MAp goal 55  Not doing well Levo at 78 Vaso added Off propofol  Called husband home and cell to discuss code status  eICU Interventions  considetrepi     Intervention Category Major Interventions: Hypotension - evaluation and management  Nelda BucksFEINSTEIN,DANIEL J. 10/09/2016, 9:44 PM

## 2016-10-09 NOTE — Procedures (Signed)
Arterial Catheter Insertion Procedure Note Lenora Boysnita M Steadham [id number] [birth year]  Procedure: Insertion of Arterial Catheter  Indications: Blood pressure monitoring and Frequent blood sampling  Procedure Details Consent: Risks of procedure as well as the alternatives and risks of each were explained to the (patient/caregiver).  Consent for procedure obtained. Time Out: Verified patient identification, verified procedure, site/side was marked, verified correct patient position, special equipment/implants available, medications/allergies/relevent history reviewed, required imaging and test results available.  Performed  Maximum sterile technique was used including antiseptics, cap, gloves, gown, hand hygiene, mask and sheet. Skin prep: Chlorhexidine; local anesthetic administered 20 gauge catheter was inserted into right radial artery using the Seldinger technique.  Evaluation Blood flow good; BP tracing good. Complications: No apparent complications.   Ancil BoozerSmallwood, [place]adden Elizondo 10/09/2016

## 2016-10-09 NOTE — Progress Notes (Signed)
PCCM Progress Note  Admission date: 10/27/2016 Referring provider: Dr. Juleen China, ER  CC: Dyspnea  HPI: 83 yo female presented with dyspnea, hypoxia, altered mental status.  Intubated in ER with brief CPR (3 minutes for ROSC).  PMHx intermittent LBBB, chronic diastolic CHF, CKD 3, HTN, Hypothyroidism, Gout, HLD, DM  Subjective: Remains on increased PEEP/FiO2, pressors.  Vital signs: BP (!) 97/55 (BP Location: Left Arm)   Pulse (!) 102   Temp 99.2 F (37.3 C) (Oral)   Resp (!) 26   Ht 5\' 2"  (1.575 m)   Wt 219 lb 5.7 oz (99.5 kg)   SpO2 95%   BMI 40.12 kg/m   Intake/output: I/O last 3 completed shifts: In: 4900.2 [I.V.:3850.2; NG/GT:650; IV Piggyback:400] Out: 1025 [Urine:1025]  General: sedated Neuro: RASS -2, follows commands HEENT: ETT in place Cardiac: regular, no murmur Chest: b/l crackles Abd: soft, + bowel sounds, non tender Ext: 1+ edema Skin: chronic redness Rt lower leg   CMP Latest Ref Rng & Units 10/09/2016 10/08/2016 10/27/2016  Glucose 65 - 99 mg/dL 161(W) 960(A) 540(J)  BUN 6 - 20 mg/dL 81(X) 91(Y) 78(G)  Creatinine 0.44 - 1.00 mg/dL 9.56(O) 1.30(Q) 6.57(Q)  Sodium 135 - 145 mmol/L 129(L) 135 136  Potassium 3.5 - 5.1 mmol/L 3.6 3.6 4.4  Chloride 101 - 111 mmol/L 95(L) 100(L) 98(L)  CO2 22 - 32 mmol/L 21(L) 24 24  Calcium 8.9 - 10.3 mg/dL 7.9(L) 7.8(L) 8.9  Total Protein 6.5 - 8.1 g/dL - - 6.5  Total Bilirubin 0.3 - 1.2 mg/dL - - 1.0  Alkaline Phos 38 - 126 U/L - - 94  AST 15 - 41 U/L - - 65(H)  ALT 14 - 54 U/L - - 20     CBC Latest Ref Rng & Units 10/09/2016 10/08/2016 2016/10/27  WBC 4.0 - 10.5 K/uL 20.3(H) 24.2(H) 23.5(H)  Hemoglobin 12.0 - 15.0 g/dL 4.6(N) 6.2(X) 11.3(L)  Hematocrit 36.0 - 46.0 % 25.2(L) 28.3(L) 34.0(L)  Platelets 150 - 400 K/uL 294 286 331    ABG    Component Value Date/Time   PHART 7.418 10/08/2016 0420   PCO2ART 34.0 10/08/2016 0420   PO2ART 59.8 (L) 10/08/2016 0420   HCO3 21.8 10/08/2016 0420   TCO2 26 2016-10-27  1612   ACIDBASEDEF 2.2 (H) 10/08/2016 0420   O2SAT 92.2 10/08/2016 0420    CBG (last 3)   Recent Labs  10/09/16 0006 10/09/16 0441 10/09/16 0816  GLUCAP 264* 331* 317*    Imaging: Dg Chest Port 1 View  Result Date: 10/09/2016 CLINICAL DATA:  Acute respiratory failure EXAM: PORTABLE CHEST 1 VIEW COMPARISON:  10/08/2016 FINDINGS: Endotracheal tube is in place, tip approximately 9 mm above the carina. A left IJ central line tip overlies the level of the lower superior vena cava. Nasogastric tube is in place, tip off the film beyond the gastroesophageal junction. The heart is upper normal in size. There are patchy infiltrates bilaterally which partially obscure the hemidiaphragms bilaterally. Suspect bilateral pleural effusions. IMPRESSION: 1. Endotracheal tube just above the carina. 2. Persistent patchy airspace filling bilaterally. Electronically Signed   By: Norva Pavlov M.D.   On: 10/09/2016 07:33   Dg Chest Port 1 View  Result Date: 10/08/2016 CLINICAL DATA:  Respiratory failure EXAM: PORTABLE CHEST 1 VIEW COMPARISON:  10/27/16 FINDINGS: Endotracheal tube tip is just above the carina. Esophageal tube tip is below the diaphragm but is not included. Left-sided central venous catheter tip partially obscured by overlying support leads, but appears to overlie the SVC.  Patch obscures the central upper chest. There is slightly improved aeration of the right thorax. Suspect small bilateral effusions. Stable cardiomediastinal silhouette with persistent central congestion and perihilar edema or infiltrates as well as bilateral lung base atelectasis or infiltrates. No pneumothorax. IMPRESSION: 1. Endotracheal tube tip is just above the carina. 2. Mildly improved aeration bilaterally. 3. Probable small pleural effusions. Moderate residual perihilar and bilateral lung base infiltrates. Electronically Signed   By: Jasmine PangKim  Fujinaga M.D.   On: 10/08/2016 22:28   Dg Chest Port 1 View  Result Date:  January 31, 2016 CLINICAL DATA:  Extubation. EXAM: PORTABLE CHEST 1 VIEW COMPARISON:  Chest radiograph January 31, 2016. FINDINGS: ET tube terminates in the mid trachea. Enteric tube courses inferior to the diaphragm. Left IJ central venous catheter tip projects over the superior cavoatrial junction. Pacer apparatus overlies the central chest. Stable cardiomegaly. Slight interval improvement in previously described diffuse bilateral consolidation, predominantly within the left upper lung. No definite pleural effusion or pneumothorax. IMPRESSION: ET tube terminates in the mid trachea. Slight interval improvement consolidation left upper lung. Otherwise persistent diffuse bilateral right-greater-than-left pulmonary consolidation. Electronically Signed   By: Annia Beltrew  Davis M.D.   On: January 31, 2016 21:51   Dg Chest Port 1 View  Result Date: January 31, 2016 CLINICAL DATA:  Central line placement EXAM: PORTABLE CHEST 1 VIEW COMPARISON:  Earlier same day FINDINGS: Newly placed internal jugular central line tip is in the SVC 3 cm above the right atrium. Endotracheal tube as well positioned 2 cm above the carina. Nasogastric tube enters the abdomen. Bilateral airspace density consistent with acute edema and/or pneumonia persists. IMPRESSION: Left internal jugular central line well positioned with its tip in the SVC above the right atrium. No complication relative to that. No other change. Electronically Signed   By: Paulina FusiMark  Shogry M.D.   On: January 31, 2016 17:46   Dg Chest Portable 1 View  Result Date: January 31, 2016 CLINICAL DATA:  Respiratory failure. EXAM: PORTABLE CHEST 1 VIEW COMPARISON:  Radiographs March 21, 2016. FINDINGS: Stable cardiomediastinal silhouette. Endotracheal tube is projected over tracheal air shadow with distal tip 2 cm above the carina. Nasogastric tube is seen entering the stomach. No pneumothorax is noted. Interval development of bilateral perihilar opacities, right greater than left concerning for edema or possibly  pneumonia. No significant pleural effusion is noted. Bony thorax is unremarkable. IMPRESSION: Endotracheal and nasogastric tubes in grossly good position. Bilateral perihilar opacities, right greater than left, concerning for edema or pneumonia. Electronically Signed   By: Lupita RaiderJames  Green Jr, M.D.   On: January 31, 2016 15:26   Dg Abd Portable 1v  Result Date: January 31, 2016 CLINICAL DATA:  OG tube placement. EXAM: PORTABLE ABDOMEN - 1 VIEW COMPARISON:  None. FINDINGS: Tip and side port of the enteric tube below the diaphragm in the stomach. No dilated bowel loops to suggest obstruction. No evidence of free air on supine view. Postsurgical change in the lower lumbar spine and left hip. IMPRESSION: Tip and side port of the enteric tube below the diaphragm in the stomach. Electronically Signed   By: Rubye OaksMelanie  Ehinger M.D.   On: January 31, 2016 21:54    Studies: Echo 1/01 >>  Antibiotics: Levaquin 12/30 >> Azactam 12/30 >> Vancomycin 12/30 >>  Cultures: Blood 12/30 >> Sputum 12/30 >> Pneumococcal Ag 12/30 >> negative Legionella Ag 12/30 >>  Urine 12/30 >> Klebsiella   Lines/tubes: ETT 12/30 >> Lt IJ CVL 12/30 >>  Events: 12/30 Admit 12/31 Cardiology consulted  Summary: 83 yo female with acute hypoxic respiratory failure 2nd to pneumonia, UTI.  Complicated by NSTEMI with LBBB, septic shock.  Assessment/plan:  Acute respiratory failure with hypoxia 2nd to pneumonia likely from aspiration. - full vent support with ARDS protocol - f/u CXR, ABG - prn BDs  Aspiration pneumonia, Klebsiella UTI. - continue ABx  Sepsis shock with lactic acidosis. - pressors to keep MAP > 65  NSTEMI with LBBB. Acute on chronic diastolic CHF, HLD. - heparin gtt - ASA, lipitor - no beta blocker in setting of septic shock - consulted cardiology 12/31 - f/u Echo  AKI. CKD 3 >> Baseline creatinine 1.88 from 03/23/16. - monitor renal fx  DM type II. Hx of hypothyroidism. - change to resistant SSI and add 10  units lantus 1/01 - continue synthroid  Acute metabolic encephalopathy. - RASS goal -2 to -3 while on ARDS protocol  DVT prophylaxis - heparin gtt SUP - Protonix Nutrition - tube feeds Goals of care - full code  Updated pt's family at bedside  CC time 36 minutes  D/w Dr. Annye Asa, MD [facility] Pulmonary/Critical Care 10/09/2016, 9:12 AM Pager:  [phone] After 3pm call: [phone]

## 2016-10-09 DEATH — deceased

## 2016-10-10 ENCOUNTER — Inpatient Hospital Stay (HOSPITAL_COMMUNITY): Payer: PPO

## 2016-10-10 DIAGNOSIS — R6520 Severe sepsis without septic shock: Secondary | ICD-10-CM

## 2016-10-10 DIAGNOSIS — N289 Disorder of kidney and ureter, unspecified: Secondary | ICD-10-CM

## 2016-10-10 LAB — BLOOD GAS, ARTERIAL
ACID-BASE DEFICIT: 8.4 mmol/L — AB (ref 0.0–2.0)
Acid-base deficit: 9 mmol/L — ABNORMAL HIGH (ref 0.0–2.0)
Acid-base deficit: 7.6 mmol/L — ABNORMAL HIGH (ref 0.0–2.0)
BICARBONATE: 17 mmol/L — AB (ref 20.0–28.0)
Bicarbonate: 17 mmol/L — ABNORMAL LOW (ref 20.0–28.0)
Bicarbonate: 18.4 mmol/L — ABNORMAL LOW (ref 20.0–28.0)
Drawn by: 437071
FIO2: 100
FIO2: 90
FIO2: 0.9
LHR: 22 {breaths}/min
LHR: 30 {breaths}/min
LHR: 35 {breaths}/min
O2 SAT: 94.8 %
O2 Saturation: 90.6 %
O2 Saturation: 91.8 %
PATIENT TEMPERATURE: 98.6
PATIENT TEMPERATURE: 98.6
PCO2 ART: 44.2 mmHg (ref 32.0–48.0)
PEEP/CPAP: 14 cmH2O
PEEP: 12 cmH2O
PEEP: 14 cmH2O
PH ART: 7.244 — AB (ref 7.350–7.450)
PO2 ART: 73.3 mmHg — AB (ref 83.0–108.0)
PO2 ART: 70.1 mmHg — AB (ref 83.0–108.0)
Patient temperature: 98.6
VT: 340 mL
VT: 340 mL
VT: 340 mL
pCO2 arterial: 41.1 mmHg (ref 32.0–48.0)
pCO2 arterial: 37 mmHg (ref 32.0–48.0)
pH, Arterial: 7.241 — ABNORMAL LOW (ref 7.350–7.450)
pH, Arterial: 7.283 — ABNORMAL LOW (ref 7.350–7.450)
pO2, Arterial: 85.6 mmHg (ref 83.0–108.0)

## 2016-10-10 LAB — COMPREHENSIVE METABOLIC PANEL
ALBUMIN: 1.9 g/dL — AB (ref 3.5–5.0)
ALT: 544 U/L — ABNORMAL HIGH (ref 14–54)
ANION GAP: 17 — AB (ref 5–15)
AST: 619 U/L — ABNORMAL HIGH (ref 15–41)
Alkaline Phosphatase: 112 U/L (ref 38–126)
BUN: 78 mg/dL — AB (ref 6–20)
CALCIUM: 8 mg/dL — AB (ref 8.9–10.3)
CHLORIDE: 93 mmol/L — AB (ref 101–111)
CO2: 18 mmol/L — AB (ref 22–32)
CREATININE: 3.27 mg/dL — AB (ref 0.44–1.00)
GFR calc non Af Amer: 12 mL/min — ABNORMAL LOW (ref >60)
GFR, EST AFRICAN AMERICAN: 14 mL/min — AB (ref >60)
Glucose, Bld: 124 mg/dL — ABNORMAL HIGH (ref 65–99)
Potassium: 4.2 mmol/L (ref 3.5–5.1)
SODIUM: 128 mmol/L — AB (ref 135–145)
TOTAL PROTEIN: 5.6 g/dL — AB (ref 6.5–8.1)
Total Bilirubin: 1 mg/dL (ref 0.3–1.2)

## 2016-10-10 LAB — CBC
HCT: 25.1 % — ABNORMAL LOW (ref 36.0–46.0)
HEMOGLOBIN: 8.6 g/dL — AB (ref 12.0–15.0)
MCH: 32.1 pg (ref 26.0–34.0)
MCHC: 34.3 g/dL (ref 30.0–36.0)
MCV: 93.7 fL (ref 78.0–100.0)
Platelets: 274 10*3/uL (ref 150–400)
RBC: 2.68 MIL/uL — ABNORMAL LOW (ref 3.87–5.11)
RDW: 15.6 % — AB (ref 11.5–15.5)
WBC: 22.7 10*3/uL — ABNORMAL HIGH (ref 4.0–10.5)

## 2016-10-10 LAB — GLUCOSE, CAPILLARY
GLUCOSE-CAPILLARY: 149 mg/dL — AB (ref 65–99)
GLUCOSE-CAPILLARY: 158 mg/dL — AB (ref 65–99)
GLUCOSE-CAPILLARY: 209 mg/dL — AB (ref 65–99)
GLUCOSE-CAPILLARY: 89 mg/dL (ref 65–99)
Glucose-Capillary: 180 mg/dL — ABNORMAL HIGH (ref 65–99)
Glucose-Capillary: 128 mg/dL — ABNORMAL HIGH (ref 65–99)
Glucose-Capillary: 117 mg/dL — ABNORMAL HIGH (ref 65–99)
Glucose-Capillary: 99 mg/dL (ref 65–99)
Glucose-Capillary: 95 mg/dL (ref 65–99)

## 2016-10-10 LAB — HEPARIN LEVEL (UNFRACTIONATED): HEPARIN UNFRACTIONATED: 0.31 [IU]/mL (ref 0.30–0.70)

## 2016-10-10 MED ORDER — INSULIN ASPART 100 UNIT/ML ~~LOC~~ SOLN: 3.0000 [IU] | SUBCUTANEOUS | Status: DC

## 2016-10-10 MED ORDER — SODIUM BICARBONATE 8.4 % IV SOLN
100.0000 meq | Freq: Once | INTRAVENOUS | Status: AC
  Administered 2016-10-10: 100 meq via INTRAVENOUS
  Filled 2016-10-10: qty 100

## 2016-10-10 MED ORDER — INSULIN GLARGINE 100 UNIT/ML ~~LOC~~ SOLN
20.0000 [IU] | SUBCUTANEOUS | Status: DC
  Administered 2016-10-10: 20 [IU] via SUBCUTANEOUS
  Filled 2016-10-10 (×2): qty 0.2

## 2016-10-10 MED ORDER — STERILE WATER FOR INJECTION IV SOLN
INTRAVENOUS | Status: DC
  Administered 2016-10-10: 03:00:00 via INTRAVENOUS
  Filled 2016-10-10 (×2): qty 850

## 2016-10-10 MED ORDER — INSULIN GLARGINE 100 UNIT/ML ~~LOC~~ SOLN: 20.0000 [IU] | Freq: Every day | SUBCUTANEOUS | Status: DC

## 2016-10-10 MED ORDER — DEXTROSE 10 % IV SOLN: INTRAVENOUS | Status: DC | PRN

## 2016-10-10 MED ORDER — INSULIN ASPART 100 UNIT/ML ~~LOC~~ SOLN: 1.0000 [IU] | SUBCUTANEOUS | Status: DC

## 2016-10-12 ENCOUNTER — Telehealth: Payer: Self-pay

## 2016-10-12 LAB — CULTURE, BLOOD (ROUTINE X 2)
Culture: NO GROWTH
Culture: NO GROWTH

## 2016-10-12 NOTE — Telephone Encounter (Signed)
On 10/12/16 I received a death certificate from [facility] [employer] (original). The death certificate is for cremation. The patient is a patient of Doctor Sood. The death certificate will be taken to Pulmonary Office @ [facility] this am for signature.  On 10/12/2016 I received the death certificate back from Doctor PowderlySood. I got the death certificate ready and called the funeral home to let them know the death certificate is ready for pickup. I also faxed a copy to the funeral home per the funeral home request.

## 2016-11-09 NOTE — Progress Notes (Signed)
eLink Physician-Brief Progress Note Patient Name: Lenora Boysnita M Ruffner DOB: 04/24/1932 MRN: [id number]   Date of Service  10/15/2016  HPI/Events of Note  Hypotension - BP = 83/53. Vasopressor requirements are increasing. Last Hgb = 11.3 and CVP = 20.   eICU Interventions  Will order: 1. Send AM ABG now.      Intervention Category Major Interventions: Hypotension - evaluation and management  Lenell AntuSommer,Steven Eugene 10/14/2016, 12:59 AM/

## 2016-11-09 NOTE — Progress Notes (Signed)
Respiratory rate increased to 30 per ARDS protocol following ABG with pH less than 7.30 RT will continue to monitor.

## 2016-11-09 NOTE — Progress Notes (Signed)
Patient Name: Donna Hurst Date of Encounter: 10-31-2016  Primary Cardiologist: Dr. Debbe Odea Problem List     Active Problems:   Acute respiratory failure with hypoxia (HCC)   Severe sepsis (HCC)   Hyponatremia   Acute renal insufficiency     Subjective   Worsening clinically. Increased pressor needs. Refractory septic shock. On Epi.  Inpatient Medications    Scheduled Meds: . aspirin  81 mg Per Tube Daily  . atorvastatin  40 mg Per Tube Daily  . aztreonam  1 g Intravenous Q8H  . chlorhexidine gluconate (MEDLINE KIT)  15 mL Mouth Rinse BID  . insulin glargine  20 Units Subcutaneous Daily  . levofloxacin (LEVAQUIN) IV  500 mg Intravenous Q48H  . levothyroxine  25 mcg Per Tube QAC breakfast  . mouth rinse  15 mL Mouth Rinse QID  . multivitamin  15 mL Per Tube Daily  . pantoprazole sodium  40 mg Per Tube Q24H  . sodium chloride flush  10-40 mL Intracatheter Q12H  . vancomycin  1,000 mg Intravenous Q24H   Continuous Infusions: . epinephrine 10 mcg/min (10/31/2016 0800)  . feeding supplement (VITAL HIGH PROTEIN) 1,000 mL (2016/10/31 0838)  . fentaNYL infusion INTRAVENOUS 100 mcg/hr (10-31-16 0800)  . heparin 700 Units/hr (2016/10/31 0800)  . insulin (NOVOLIN-R) infusion 1.2 mL/hr at 31-Oct-2016 0800  . lactated ringers 40 mL/hr at October 31, 2016 0800  . midazolam (VERSED) infusion 2 mg/hr (31-Oct-2016 0800)  . norepinephrine (LEVOPHED) Adult infusion 80 mcg/min (October 31, 2016 0842)  .  sodium bicarbonate 150 mEq in sterile water 1000 mL infusion 50 mL/hr at Oct 31, 2016 0800  . vasopressin (PITRESSIN) infusion - *FOR SHOCK* 0.03 Units/min (2016-10-31 0800)   PRN Meds: albuterol, fentaNYL, fentaNYL (SUBLIMAZE) injection, midazolam, midazolam, sodium chloride flush   Vital Signs    Vitals:   Oct 31, 2016 0500 2016-10-31 0600 2016-10-31 0700 31-Oct-2016 0800  BP:  93/62  (!) 100/38  Pulse: (!) 105 (!) 113  (!) 104  Resp: (!) 28 (!) 35  (!) 23  Temp:   97.7 F (36.5 C)   TempSrc:   Oral   SpO2:  94% 95%  96%  Weight:      Height:        Intake/Output Summary (Last 24 hours) at 10/31/2016 0847 Last data filed at 10/31/16 0800  Gross per 24 hour  Intake          5569.93 ml  Output              125 ml  Net          5444.93 ml   Filed Weights   10/08/16 0500 10/09/16 0500 October 31, 2016 0400  Weight: 217 lb 6 oz (98.6 kg) 219 lb 5.7 oz (99.5 kg) 232 lb 2.3 oz (105.3 kg)    Physical Exam    GEN: ill appearing on vent.  HEENT: Grossly normal.  Neck: Supple, no JVD, carotid bruits, or masses. ETT Cardiac: RRR, mildly tachy, 2/6 S murmur,no rubs, or gallops. No clubbing, cyanosis, edema.  Radials/DP/PT 2+ and equal bilaterally.  Respiratory: Vent noise B, rhonchi. GI: Soft, nontender, nondistended, BS + x 4. MS: no deformity or atrophy. Skin: warm and dry, no rash, chronic redness LE, right. Neuro:  Strength and sensation are intact. Psych: Sedate.   Labs    CBC  Recent Labs  09/17/2016 1453  10/09/16 0500 31-Oct-2016 0430  WBC 23.5*  < > 20.3* 22.7*  NEUTROABS 16.7*  --   --   --  HGB 11.3*  < > 8.4* 8.6*  HCT 34.0*  < > 25.2* 25.1*  MCV 97.7  < > 94.4 93.7  PLT 331  < > 294 274  < > = values in this interval not displayed. Basic Metabolic Panel  Recent Labs  10/09/16 0500 10/09/16 1814 Oct 26, 2016 0430  NA 129*  --  128*  K 3.6  --  4.2  CL 95*  --  93*  CO2 21*  --  18*  GLUCOSE 314*  --  124*  BUN 71*  --  78*  CREATININE 2.48*  --  3.27*  CALCIUM 7.9*  --  8.0*  MG 2.2 2.1  --   PHOS 3.4 3.5  --    Liver Function Tests  Recent Labs  09/17/2016 1453 10/26/16 0430  AST 65* 619*  ALT 20 544*  ALKPHOS 94 112  BILITOT 1.0 1.0  PROT 6.5 5.6*  ALBUMIN 2.8* 1.9*   No results for input(s): LIPASE, AMYLASE in the last 72 hours. Cardiac Enzymes  Recent Labs  10/01/2016 1640 10/02/2016 2243 10/08/16 0443  TROPONINI 5.65* 26.52* 45.70*   BNP Invalid input(s): POCBNP D-Dimer No results for input(s): DDIMER in the last 72 hours. Hemoglobin A1C  Recent  Labs  09/11/2016 1640  HGBA1C 6.1*   Fasting Lipid Panel  Recent Labs  10/09/16 0500  TRIG 208*   Thyroid Function Tests No results for input(s): TSH, T4TOTAL, T3FREE, THYROIDAB in the last 72 hours.  Invalid input(s): FREET3  Telemetry    No adverse rhythms - Personally Reviewed  ECG    Sinus tach LBBB 106 - Personally Reviewed  Radiology    Dg Chest Port 1 View  Result Date: 10-26-16 CLINICAL DATA:  ARDS. EXAM: PORTABLE CHEST 1 VIEW COMPARISON:  One-view chest 10/09/2016. FINDINGS: The endotracheal tube remains low, approximately 2.5 cm above the carina. Left IJ line is in place. The NG tube courses off the inferior border of the film. The heart size is exaggerated by low lung volumes. Bilateral interstitial and airspace disease is similar to the prior exam. Bilateral pleural effusions persist. IMPRESSION: 1. The endotracheal tube remains low at 2.5 cm above the carina. It could be pulled back 1 more cm for more optimal positioning. 2. Stable bilateral interstitial and airspace disease compatible with ARDS. 3. Bilateral pleural effusions. Electronically Signed   By: San Morelle M.D.   On: 10/26/16 06:44   Dg Chest Port 1 View  Result Date: 10/09/2016 CLINICAL DATA:  Acute respiratory failure EXAM: PORTABLE CHEST 1 VIEW COMPARISON:  10/08/2016 FINDINGS: Endotracheal tube is in place, tip approximately 9 mm above the carina. A left IJ central line tip overlies the level of the lower superior vena cava. Nasogastric tube is in place, tip off the film beyond the gastroesophageal junction. The heart is upper normal in size. There are patchy infiltrates bilaterally which partially obscure the hemidiaphragms bilaterally. Suspect bilateral pleural effusions. IMPRESSION: 1. Endotracheal tube just above the carina. 2. Persistent patchy airspace filling bilaterally. Electronically Signed   By: Nolon Nations M.D.   On: 10/09/2016 07:33   Dg Chest Port 1 View  Result Date:  10/08/2016 CLINICAL DATA:  Respiratory failure EXAM: PORTABLE CHEST 1 VIEW COMPARISON:  09/23/2016 FINDINGS: Endotracheal tube tip is just above the carina. Esophageal tube tip is below the diaphragm but is not included. Left-sided central venous catheter tip partially obscured by overlying support leads, but appears to overlie the SVC. Patch obscures the central upper chest. There is slightly  improved aeration of the right thorax. Suspect small bilateral effusions. Stable cardiomediastinal silhouette with persistent central congestion and perihilar edema or infiltrates as well as bilateral lung base atelectasis or infiltrates. No pneumothorax. IMPRESSION: 1. Endotracheal tube tip is just above the carina. 2. Mildly improved aeration bilaterally. 3. Probable small pleural effusions. Moderate residual perihilar and bilateral lung base infiltrates. Electronically Signed   By: Donavan Foil M.D.   On: 10/08/2016 22:28    Cardiac Studies   Echo 03/15/16: - Left ventricle: The cavity size was normal. Wall thickness was   increased in a pattern of mild LVH. Systolic function was normal.   The estimated ejection fraction was in the range of 60% to 65%.   Wall motion was normal; there were no regional wall motion   abnormalities. Features are consistent with a pseudonormal left   ventricular filling pattern, with concomitant abnormal relaxation   and increased filling pressure (grade 2 diastolic dysfunction). - Aortic valve: Trileaflet; moderately calcified leaflets. There   was mild stenosis. Mean gradient (S): 16 mm Hg. - Mitral valve: Mildly calcified annulus. Mildly calcified leaflets   . There was mild regurgitation. - Left atrium: The atrium was mildly dilated. - Right ventricle: The cavity size was normal. Systolic function   was normal. - Pulmonary arteries: PA peak pressure: 41 mm Hg (S). - Inferior vena cava: The vessel was normal in size. The   respirophasic diameter changes were in the  normal range (>= 50%),   consistent with normal central venous pressure.  Impressions:  - Normal LV size with mild LV hypertrophy. EF 60-65%. Moderate   diastolic dysfunction. Normal RV size and systolic function. Mild   aortic stenosis. Mild pulmonary hypertension.  NUC stress test 09/29/16  Nuclear stress EF: 57%.  There was no ST segment deviation noted during stress.  Defect 1: There is a small defect of moderate severity present in the apical septal location. Likely attributable to LBBB.  This is a low risk study.  The left ventricular ejection fraction is normal (55-65%).  Patient Profile     83 yo female presented with dyspnea, hypoxia, altered mental status.  Intubated in ER with brief CPR (3 minutes for ROSC). PMHx intermittent LBBB, chronic diastolic CHF, CKD 3, HTN, Hypothyroidism, Gout, HLD, DM  Assessment & Plan    NSTEMI LBBB  - Heparin IV  - No Bb in setting of refractory shock  - ASA, lipitor  - recent NUC stress low risk, normal EF.   Acute renal failure  - anuric, ATN, shock  Aspiration PNA/ acute hypoxic respiratory failure  - septic shock, metabolic acidosis, ARDS like  Prognosis appears extremely poor. CCM notes reviewed.    Signed, Candee Furbish, MD  October 21, 2016, 8:47 AM

## 2016-11-09 NOTE — Progress Notes (Signed)
eLink Physician-Brief Progress Note Patient Name: Donna Hurst DOB: [birth year] MRN: [id number]   Date of Service  10/14/2016  HPI/Events of Note  ABG on PRVC rate of 30 = 7.24/41.1/73.3/17.  eICU Interventions  Will order:  1. NaHCO3 100 meq IV now. 2. NaHCO3 IV infusion at 50 mL/hour.  3. ABG in AM.     Intervention Category Major Interventions: Acid-Base disturbance - evaluation and management;Respiratory failure - evaluation and management  Sommer,Steven Dennard Nipugene 10/28/2016, 2:38 AM =

## 2016-11-09 NOTE — Discharge Summary (Signed)
Donna Hurst was a 83 y.o. female admitted on 09/18/2016 dyspnea, hypoxia, and altered mental status.  She had cardiac arrest with 3 minutes for ROSC.  She was found to have pneumonia and UTI.  She had NSTEMI with LBBB.  She developed progressive septic shock, metabolic acidosis, and acute renal failure.  Family meeting, and all members were in agreement for DNR status.  She progressed to asystole and expired on 10/10/16 at 9:40 am.  Final Diagnoses: Acute hypoxic respiratory failure Cardiac arrest Aspiration pneumonia Klebsiella UTI Acute renal failure CKD 3 Acute on chronic diastolic CHF NSTEMI Left bundle branch block DM type II Septic shock Lactic acidosis with anion gap metabolic acidosis Shock liver Acute metabolic encephalopathy Former smoker   Coralyn HellingVineet Jacqualin Shirkey, MD [facility] Pulmonary/Critical Care 10/11/2016, 3:44 PM

## 2016-11-09 NOTE — Progress Notes (Addendum)
100 ml fentanyl and 25 ml versed wasted in sink with Smith RobertErin Moore RN  Witnessed, Aretta NipMoore, Elray Dains C, RN

## 2016-11-09 NOTE — Progress Notes (Signed)
Pt's husband and son, Misty StanleyStacey arrived.  Also spoke with son, Loraine LericheMark over speaker phone.  They were in agreement for DNR status.  During our conversation she developed asystole.  No heart sounds on ausculation.  Patient pronounced dead at 9:40 AM.  Coralyn HellingVineet Adalynd Donahoe, MD [facility] Pul[facility] 11/04/2016, 9:40 AM Pager:  [phone] After 3pm call: [phone]

## 2016-11-09 NOTE — Progress Notes (Signed)
PCCM Progress Note  Admission date: 09/13/2016 Referring provider: Dr. Juleen ChinaKohut, ER  CC: Dyspnea  HPI: 83 yo female presented with dyspnea, hypoxia, altered mental status.  Intubated in ER with brief CPR (3 minutes for ROSC).  PMHx intermittent LBBB, chronic diastolic CHF, CKD 3, HTN, Hypothyroidism, Gout, HLD, DM  Subjective: Progressive shock, now on epi gtt.  Refractory hypoxia, acidosis.  Anuric.  Vital signs: BP 93/62   Pulse (!) 113   Temp 98.5 F (36.9 C) (Oral)   Resp (!) 35   Ht 5\' 2"  (1.575 m)   Wt 232 lb 2.3 oz (105.3 kg)   SpO2 95%   BMI 42.46 kg/m   Intake/output: I/O last 3 completed shifts: In: 7019.5 [I.V.:4769.5; NG/GT:1750; IV Piggyback:500] Out: 460 [Urine:460]  General: unresponsive Neuro: RASS -4 HEENT: Pupils mid point and weakly reactive, ETT in place Cardiac: regular, no murmur Chest: b/l crackles Abd: soft, + bowel sounds, non tender Ext: 1+ edema Skin: chronic redness Rt lower leg, skin cool   CMP Latest Ref Rng & Units 10/23/2016 10/09/2016 10/08/2016  Glucose 65 - 99 mg/dL 960(A124(H) 540(J314(H) 811(B277(H)  BUN 6 - 20 mg/dL 14(N78(H) 82(N71(H) 56(O62(H)  Creatinine 0.44 - 1.00 mg/dL 1.30(Q3.27(H) 6.57(Q2.48(H) 4.69(G2.08(H)  Sodium 135 - 145 mmol/L 128(L) 129(L) 135  Potassium 3.5 - 5.1 mmol/L 4.2 3.6 3.6  Chloride 101 - 111 mmol/L 93(L) 95(L) 100(L)  CO2 22 - 32 mmol/L 18(L) 21(L) 24  Calcium 8.9 - 10.3 mg/dL 8.0(L) 7.9(L) 7.8(L)  Total Protein 6.5 - 8.1 g/dL 2.9(B5.6(L) - -  Total Bilirubin 0.3 - 1.2 mg/dL 1.0 - -  Alkaline Phos 38 - 126 U/L 112 - -  AST 15 - 41 U/L 619(H) - -  ALT 14 - 54 U/L 544(H) - -     CBC Latest Ref Rng & Units 10/17/2016 10/09/2016 10/08/2016  WBC 4.0 - 10.5 K/uL 22.7(H) 20.3(H) 24.2(H)  Hemoglobin 12.0 - 15.0 g/dL 2.8(U8.6(L) 1.3(K8.4(L) 4.4(W9.3(L)  Hematocrit 36.0 - 46.0 % 25.1(L) 25.2(L) 28.3(L)  Platelets 150 - 400 K/uL 274 294 286    ABG    Component Value Date/Time   PHART 7.283 (L) 06-23-2017 0600   PCO2ART 37.0 06-23-2017 0600   PO2ART 70.1 (L) 06-23-2017  0600   HCO3 17.0 (L) 06-23-2017 0600   TCO2 26 10/05/2016 1612   ACIDBASEDEF 8.4 (H) 06-23-2017 0600   O2SAT 91.8 06-23-2017 0600    CBG (last 3)   Recent Labs  12/20/2016 0517 12/20/2016 0623 12/20/2016 0729  GLUCAP 117* 99 89    Imaging: Dg Chest Port 1 View  Result Date: 10/16/2016 CLINICAL DATA:  ARDS. EXAM: PORTABLE CHEST 1 VIEW COMPARISON:  One-view chest 10/09/2016. FINDINGS: The endotracheal tube remains low, approximately 2.5 cm above the carina. Left IJ line is in place. The NG tube courses off the inferior border of the film. The heart size is exaggerated by low lung volumes. Bilateral interstitial and airspace disease is similar to the prior exam. Bilateral pleural effusions persist. IMPRESSION: 1. The endotracheal tube remains low at 2.5 cm above the carina. It could be pulled back 1 more cm for more optimal positioning. 2. Stable bilateral interstitial and airspace disease compatible with ARDS. 3. Bilateral pleural effusions. Electronically Signed   By: Marin Robertshristopher  Mattern M.D.   On: 06-23-2017 06:44   Dg Chest Port 1 View  Result Date: 10/09/2016 CLINICAL DATA:  Acute respiratory failure EXAM: PORTABLE CHEST 1 VIEW COMPARISON:  10/08/2016 FINDINGS: Endotracheal tube is in place, tip approximately 9 mm above  the carina. A left IJ central line tip overlies the level of the lower superior vena cava. Nasogastric tube is in place, tip off the film beyond the gastroesophageal junction. The heart is upper normal in size. There are patchy infiltrates bilaterally which partially obscure the hemidiaphragms bilaterally. Suspect bilateral pleural effusions. IMPRESSION: 1. Endotracheal tube just above the carina. 2. Persistent patchy airspace filling bilaterally. Electronically Signed   By: Norva Pavlov M.D.   On: 10/09/2016 07:33   Dg Chest Port 1 View  Result Date: 10/08/2016 CLINICAL DATA:  Respiratory failure EXAM: PORTABLE CHEST 1 VIEW COMPARISON:  09/20/2016 FINDINGS: Endotracheal  tube tip is just above the carina. Esophageal tube tip is below the diaphragm but is not included. Left-sided central venous catheter tip partially obscured by overlying support leads, but appears to overlie the SVC. Patch obscures the central upper chest. There is slightly improved aeration of the right thorax. Suspect small bilateral effusions. Stable cardiomediastinal silhouette with persistent central congestion and perihilar edema or infiltrates as well as bilateral lung base atelectasis or infiltrates. No pneumothorax. IMPRESSION: 1. Endotracheal tube tip is just above the carina. 2. Mildly improved aeration bilaterally. 3. Probable small pleural effusions. Moderate residual perihilar and bilateral lung base infiltrates. Electronically Signed   By: Jasmine Pang M.D.   On: 10/08/2016 22:28    Studies: Echo 1/01 >>  Antibiotics: Levaquin 12/30 >> Azactam 12/30 >> Vancomycin 12/30 >>  Cultures: Blood 12/30 >> Sputum 12/30 >> Pneumococcal Ag 12/30 >> negative Legionella Ag 12/30 >> negative Urine 12/30 >> Klebsiella   Lines/tubes: ETT 12/30 >> Lt IJ CVL 12/30 >>  Events: 12/30 Admit 12/31 Cardiology consulted 01/01 start ARDS protocol, insulin gtt 01/02 Progressive hypoxia, shock, anuric  Summary: 83 yo female with acute hypoxic respiratory failure 2nd to pneumonia, UTI.  Complicated by NSTEMI with LBBB, septic shock.  Now with multiorgan failure.  Assessment/plan:  Acute respiratory failure with hypoxia 2nd to pneumonia likely from aspiration. - full vent support with ARDS protocol - f/u CXR, ABG - prn BDs  Aspiration pneumonia, Klebsiella UTI. - continue ABx  Sepsis shock with lactic acidosis. - pressors to keep MAP > 65  NSTEMI with LBBB. Acute on chronic diastolic CHF, HLD. - heparin gtt - ASA, lipitor - no beta blocker in setting of septic shock - consulted cardiology 12/31 - f/u Echo  AKI. CKD 3 >> Baseline creatinine 1.88 from 03/23/16. - monitor renal  fx  Shock liver. - f/u LFTs  DM type II. Hx of hypothyroidism. - insulin gtt - continue synthroid  Acute metabolic encephalopathy. - RASS goal -2 to -3 while on ARDS protocol  DVT prophylaxis - heparin gtt SUP - Protonix Nutrition - tube feeds Goals of care - full code.  Spoke with pt's husband and son, Misty Stanley, over the phone.  Explained that she is getting progressively worse in spite of maximal medical therapy.  They will come to hospital later today to further discuss goals of care.  I do not think she can survive this illness.  CC time 38 minutes.  Coralyn Helling, MD [facility] Pulmonary/Critical Care 11/08/2016, 8:15 AM Pager:  [phone] After 3pm call: [phone]   Coralyn Helling, MD [facility] Pulmonary/Critical Care 2016/11/08, 8:11 AM Pager:  [phone] After 3pm call: [phone]

## 2016-11-09 DEATH — deceased

## 2016-12-25 ENCOUNTER — Ambulatory Visit: Payer: PPO | Admitting: Cardiovascular Disease

## 2017-07-22 IMAGING — CR DG CHEST 1V PORT
1 series · 1 of 1 positions shown · non-contrast
Comparison: Chest radiograph performed 12/17/2014

CLINICAL DATA: Acute onset of shortness of breath and respiratory
distress. Initial encounter.

EXAM:
PORTABLE CHEST 1 VIEW

[AP]
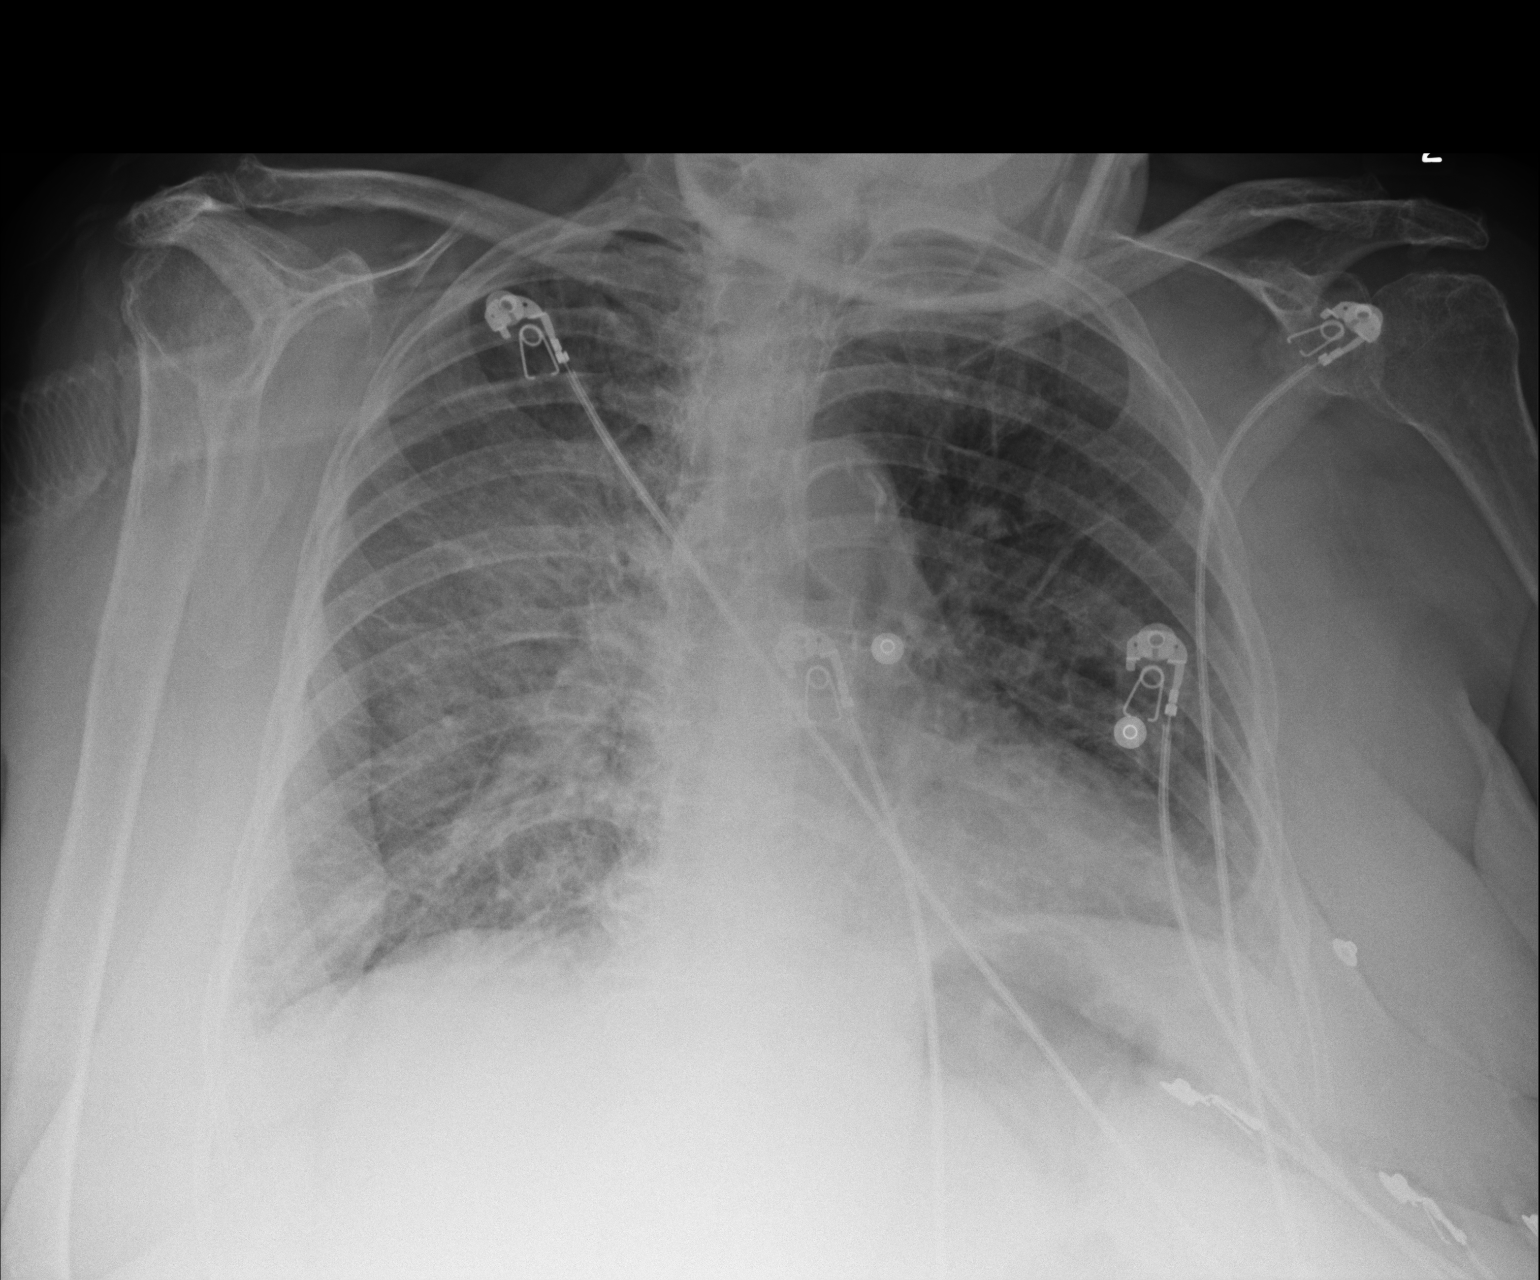

[1 of 1 positions shown; findings below may reference images not displayed]

FINDINGS: The lungs are well-aerated. Patchy right-sided airspace opacity may
reflect asymmetric pulmonary edema or pneumonia. There is no
evidence of pleural effusion or pneumothorax.

The cardiomediastinal silhouette is borderline normal in size. No
acute osseous abnormalities are seen.
IMPRESSION: Patchy right-sided airspace opacity may reflect asymmetric pulmonary
edema or pneumonia.

## 2017-07-23 IMAGING — DX DG CHEST 2V
2 series · 2 of 2 positions shown · non-contrast
Comparison: Single-view of the chest [DATE] and 12/17/2014.

CLINICAL DATA: Community acquired pneumonia.

EXAM:
CHEST  2 VIEW

[x chest ap]
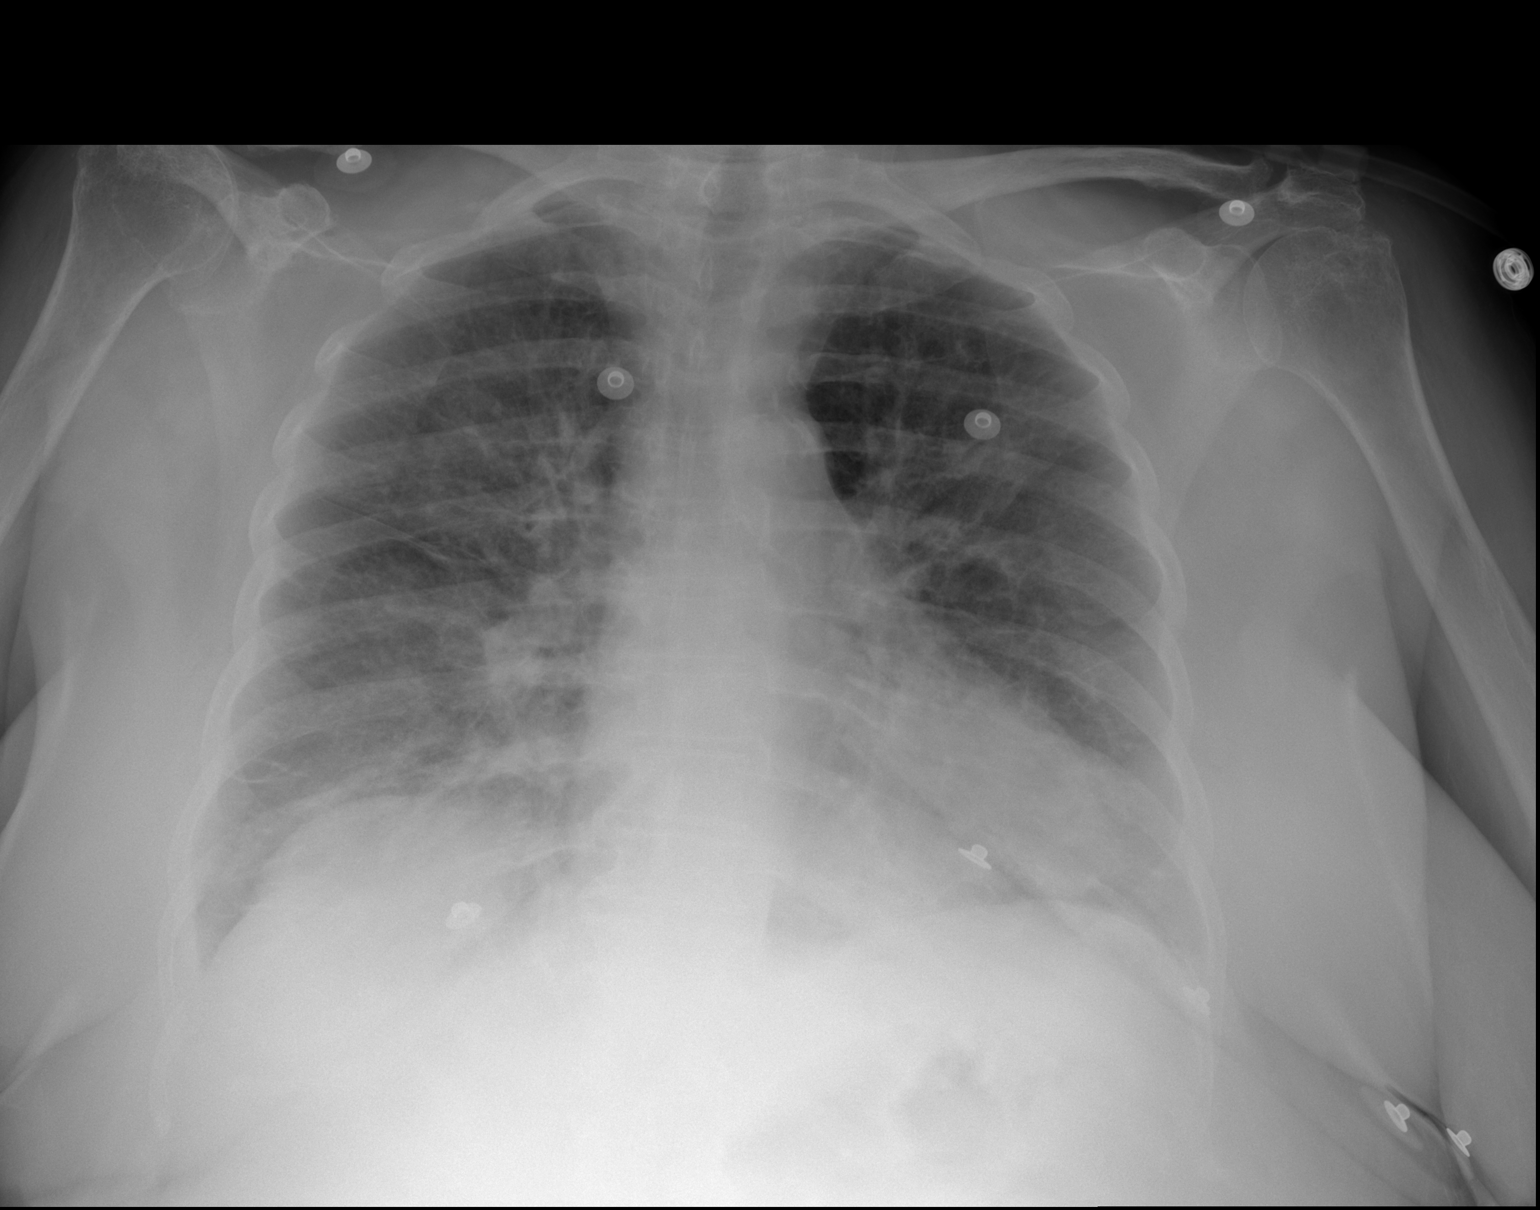

[w chest lat]
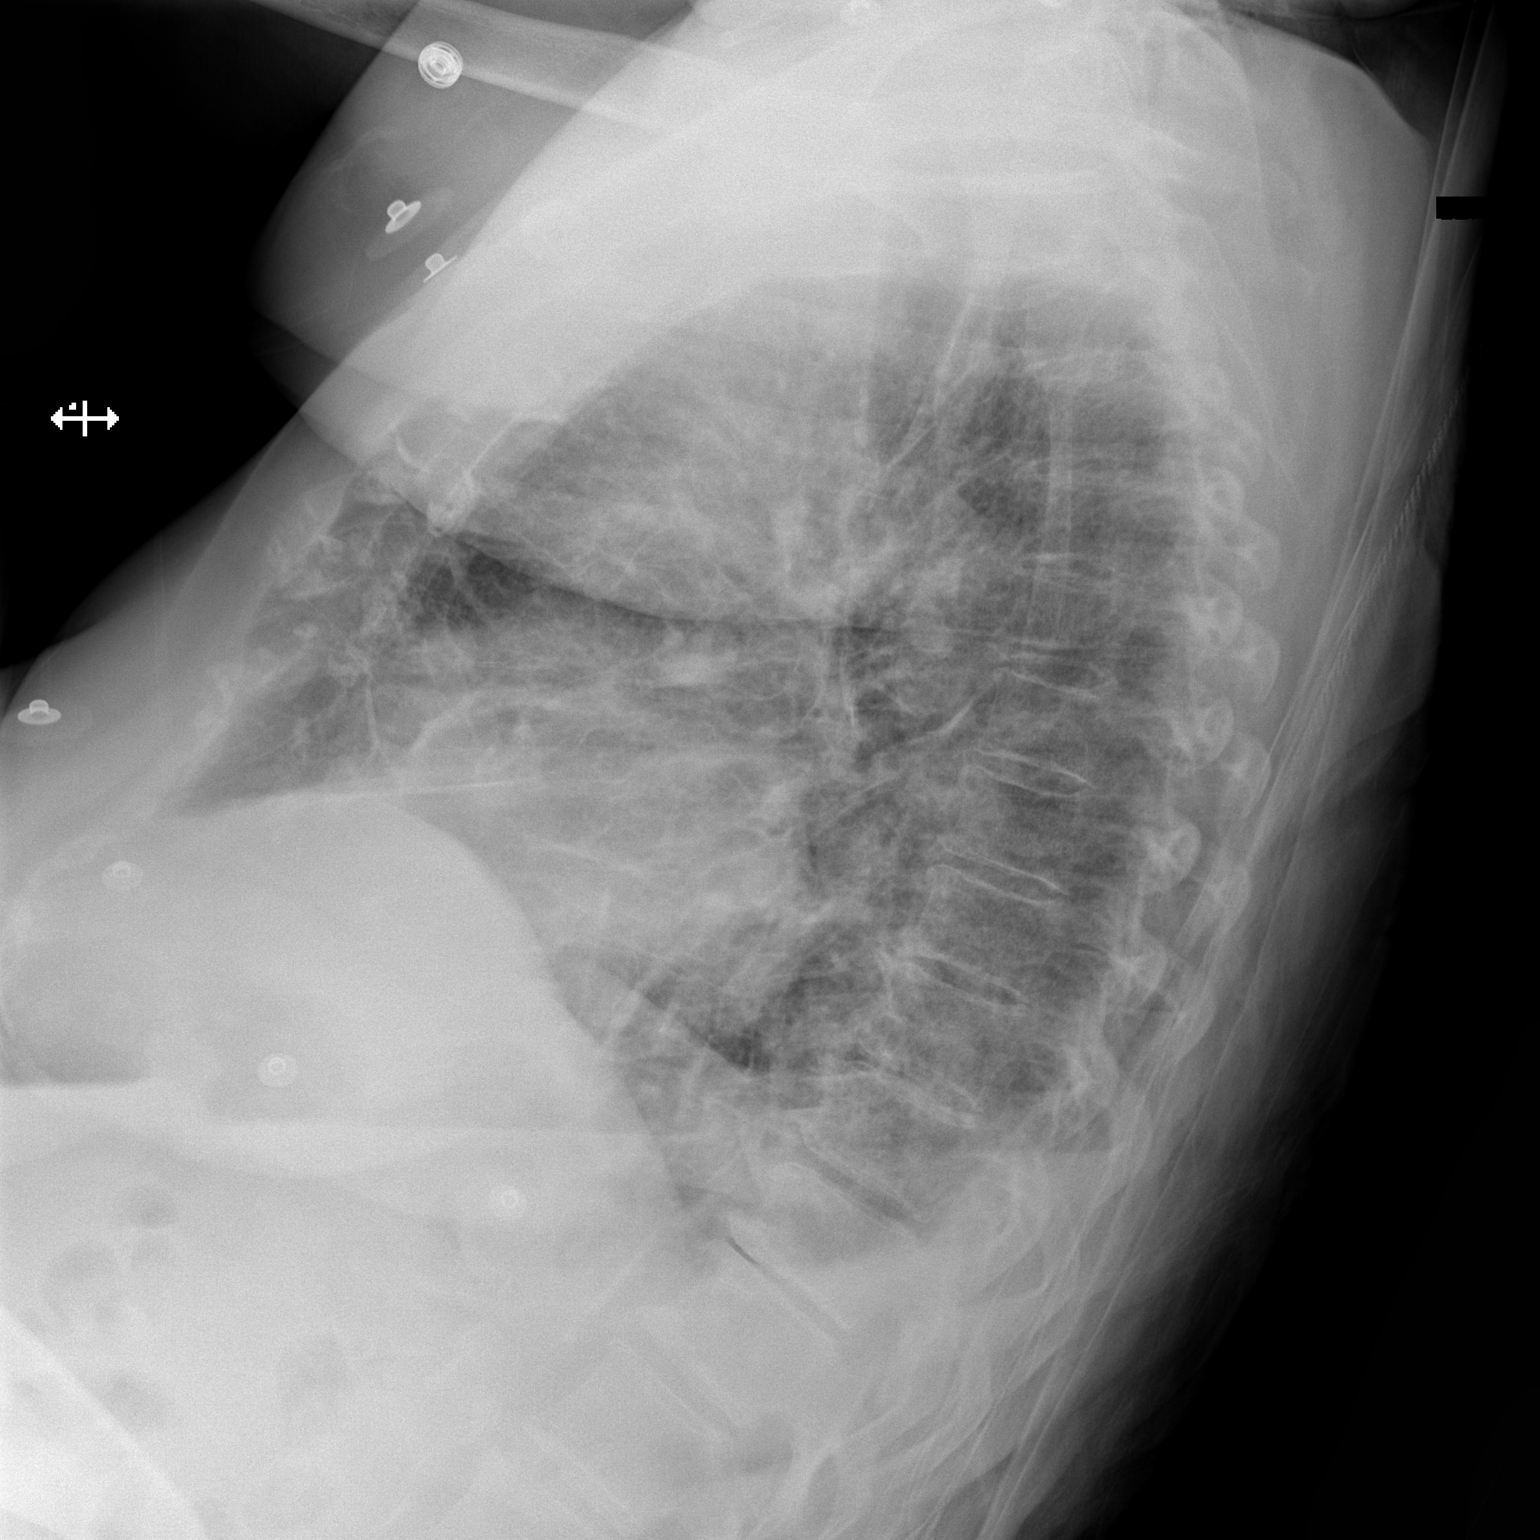

[2 of 2 positions shown; findings below may reference images not displayed]

FINDINGS: Patchy airspace disease in the right chest is improved. There small
bilateral pleural effusions. Interstitial pulmonary edema appears
improved.
IMPRESSION: Improved airspace disease on the right.

Small bilateral pleural effusions, worse on the right.

Improved interstitial pulmonary edema.

## 2017-08-16 ENCOUNTER — Ambulatory Visit (INDEPENDENT_AMBULATORY_CARE_PROVIDER_SITE_OTHER): Payer: PPO | Admitting: Ophthalmology

## 2017-08-17 ENCOUNTER — Ambulatory Visit (INDEPENDENT_AMBULATORY_CARE_PROVIDER_SITE_OTHER): Payer: PPO | Admitting: Ophthalmology

## 2018-02-09 IMAGING — CR DG CHEST 1V PORT
1 series · 1 of 1 positions shown · non-contrast
Comparison: Chest radiograph 10/07/2016.

CLINICAL DATA: Extubation.

EXAM:
PORTABLE CHEST 1 VIEW

[AP]
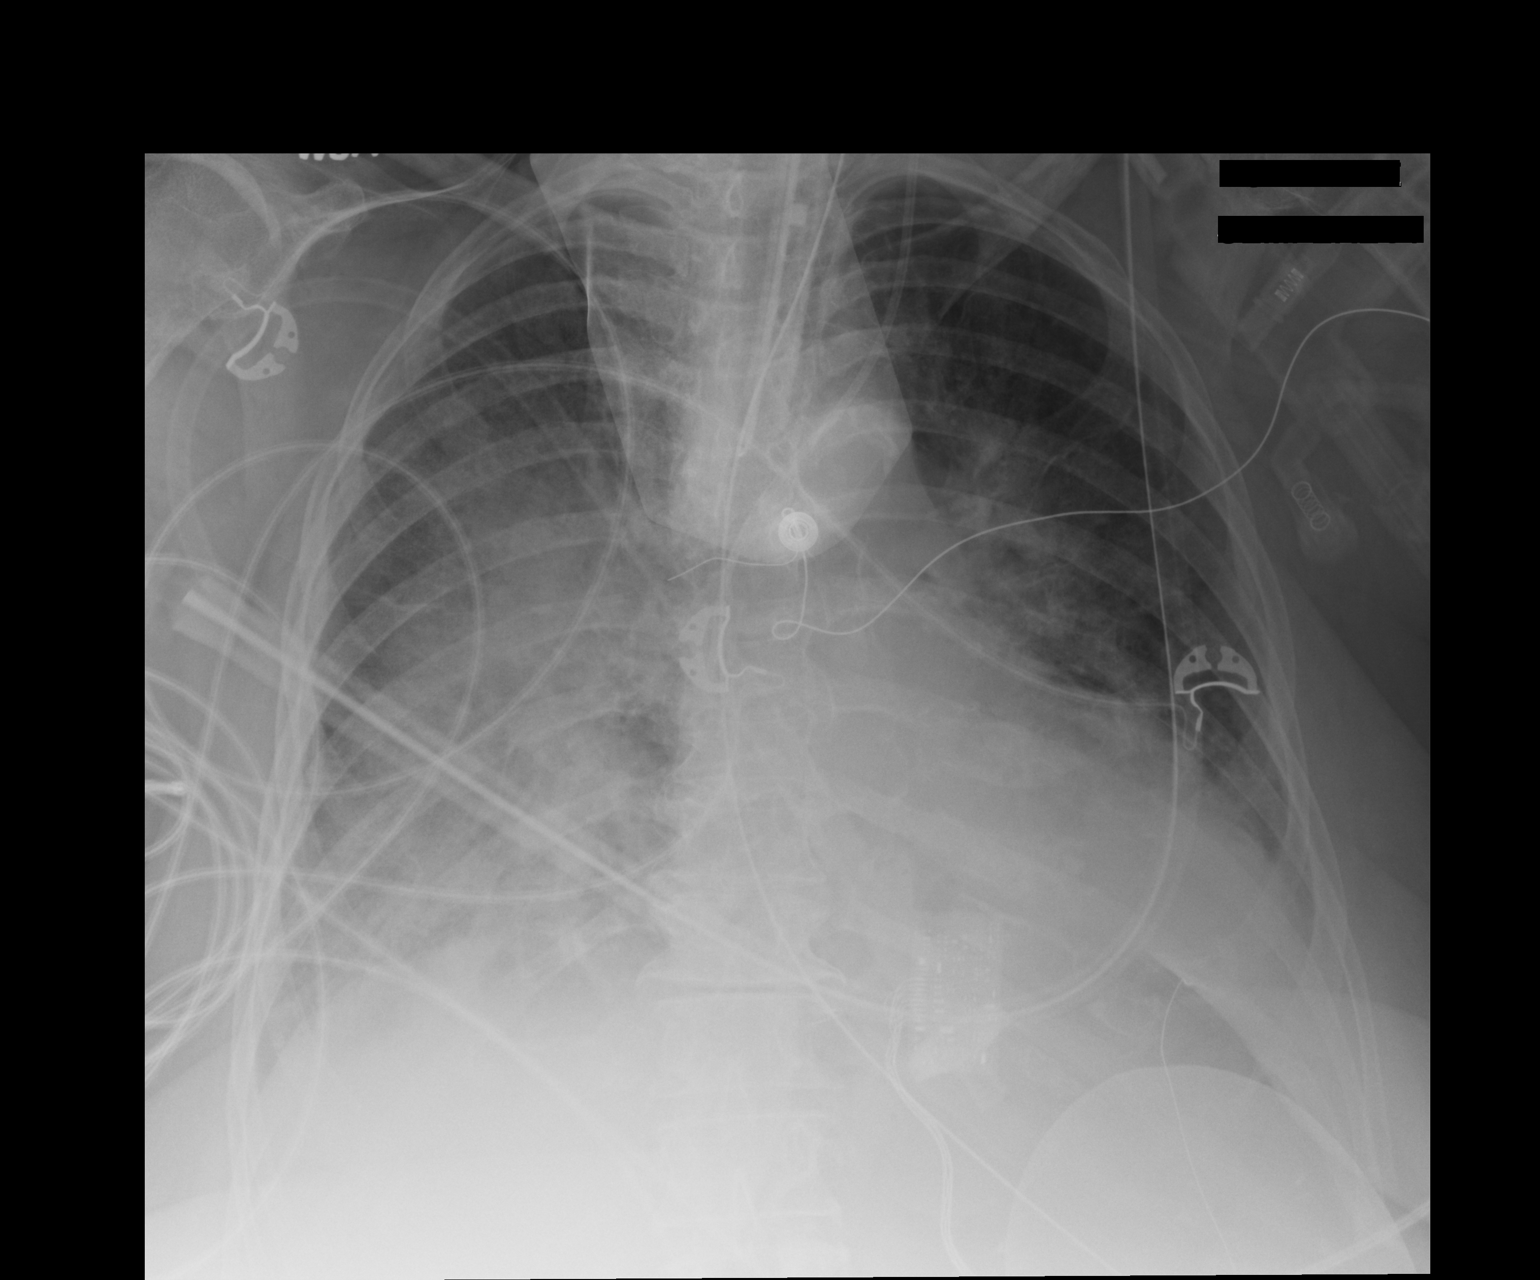

[1 of 1 positions shown; findings below may reference images not displayed]

FINDINGS: ET tube terminates in the mid trachea. Enteric tube courses inferior
to the diaphragm. Left IJ central venous catheter tip projects over
the superior cavoatrial junction. Pacer apparatus overlies the
central chest. Stable cardiomegaly. Slight interval improvement in
previously described diffuse bilateral consolidation, predominantly
within the left upper lung. No definite pleural effusion or
pneumothorax.
IMPRESSION: ET tube terminates in the mid trachea.

Slight interval improvement consolidation left upper lung. Otherwise
persistent diffuse bilateral right-greater-than-left pulmonary
consolidation.
